# Patient Record
Sex: Female | Born: 1960 | Race: White | Hispanic: No | Marital: Married | Smoking: Never smoker
Health system: Southern US, Community
[De-identification: ages and names within clinical notes are randomized; demographics above are authoritative.]

## PROBLEM LIST (undated history)

## (undated) DIAGNOSIS — M810 Age-related osteoporosis without current pathological fracture: Secondary | ICD-10-CM

## (undated) DIAGNOSIS — K2100 Gastro-esophageal reflux disease with esophagitis, without bleeding: Secondary | ICD-10-CM

## (undated) DIAGNOSIS — F32A Depression, unspecified: Secondary | ICD-10-CM

## (undated) DIAGNOSIS — G729 Myopathy, unspecified: Secondary | ICD-10-CM

## (undated) DIAGNOSIS — K219 Gastro-esophageal reflux disease without esophagitis: Secondary | ICD-10-CM

## (undated) DIAGNOSIS — M3500 Sicca syndrome, unspecified: Secondary | ICD-10-CM

## (undated) DIAGNOSIS — I10 Essential (primary) hypertension: Secondary | ICD-10-CM

## (undated) DIAGNOSIS — G629 Polyneuropathy, unspecified: Secondary | ICD-10-CM

## (undated) DIAGNOSIS — F329 Major depressive disorder, single episode, unspecified: Secondary | ICD-10-CM

## (undated) DIAGNOSIS — J309 Allergic rhinitis, unspecified: Secondary | ICD-10-CM

## (undated) DIAGNOSIS — G894 Chronic pain syndrome: Secondary | ICD-10-CM

## (undated) DIAGNOSIS — N189 Chronic kidney disease, unspecified: Secondary | ICD-10-CM

## (undated) DIAGNOSIS — K635 Polyp of colon: Secondary | ICD-10-CM

## (undated) DIAGNOSIS — J45909 Unspecified asthma, uncomplicated: Secondary | ICD-10-CM

## (undated) DIAGNOSIS — J40 Bronchitis, not specified as acute or chronic: Secondary | ICD-10-CM

## (undated) DIAGNOSIS — F419 Anxiety disorder, unspecified: Secondary | ICD-10-CM

## (undated) HISTORY — DX: Chronic kidney disease, unspecified: N18.9

## (undated) HISTORY — DX: Myopathy, unspecified: G72.9

## (undated) HISTORY — DX: Sjogren syndrome, unspecified: M35.00

## (undated) HISTORY — DX: Bronchitis, not specified as acute or chronic: J40

## (undated) HISTORY — DX: Allergic rhinitis, unspecified: J30.9

## (undated) HISTORY — DX: Chronic pain syndrome: G89.4

## (undated) HISTORY — PX: UPPER GASTROINTESTINAL ENDOSCOPY: SHX188

## (undated) HISTORY — DX: Essential (primary) hypertension: I10

## (undated) HISTORY — DX: Polyneuropathy, unspecified: G62.9

## (undated) HISTORY — PX: COLONOSCOPY: SHX174

## (undated) HISTORY — DX: Hypercalcemia: E83.52

## (undated) HISTORY — DX: Major depressive disorder, single episode, unspecified: F32.9

## (undated) HISTORY — DX: Gastro-esophageal reflux disease with esophagitis, without bleeding: K21.00

## (undated) HISTORY — PX: ABDOMINAL HYSTERECTOMY: SHX81

## (undated) HISTORY — DX: Polyp of colon: K63.5

## (undated) HISTORY — DX: Depression, unspecified: F32.A

## (undated) HISTORY — DX: Anxiety disorder, unspecified: F41.9

## (undated) HISTORY — DX: Unspecified asthma, uncomplicated: J45.909

## (undated) HISTORY — DX: Gastro-esophageal reflux disease without esophagitis: K21.9

## (undated) HISTORY — DX: Age-related osteoporosis without current pathological fracture: M81.0

---

## 2014-07-01 DIAGNOSIS — N23 Unspecified renal colic: Secondary | ICD-10-CM | POA: Insufficient documentation

## 2014-11-10 DIAGNOSIS — W19XXXA Unspecified fall, initial encounter: Secondary | ICD-10-CM | POA: Insufficient documentation

## 2014-11-28 ENCOUNTER — Ambulatory Visit (INDEPENDENT_AMBULATORY_CARE_PROVIDER_SITE_OTHER): Payer: BLUE CROSS/BLUE SHIELD

## 2014-11-28 DIAGNOSIS — R52 Pain, unspecified: Secondary | ICD-10-CM

## 2014-11-28 DIAGNOSIS — M779 Enthesopathy, unspecified: Secondary | ICD-10-CM

## 2014-11-28 DIAGNOSIS — M722 Plantar fascial fibromatosis: Secondary | ICD-10-CM

## 2014-11-28 DIAGNOSIS — M7730 Calcaneal spur, unspecified foot: Secondary | ICD-10-CM | POA: Diagnosis not present

## 2014-11-28 DIAGNOSIS — M71571 Other bursitis, not elsewhere classified, right ankle and foot: Secondary | ICD-10-CM | POA: Diagnosis not present

## 2014-11-28 DIAGNOSIS — M7751 Other enthesopathy of right foot: Secondary | ICD-10-CM

## 2014-11-28 DIAGNOSIS — M778 Other enthesopathies, not elsewhere classified: Secondary | ICD-10-CM

## 2014-11-28 MED ORDER — MELOXICAM 15 MG PO TABS: 15.0000 mg | ORAL_TABLET | Freq: Every day | ORAL | Status: DC

## 2014-11-28 NOTE — Patient Instructions (Signed)

## 2014-11-28 NOTE — Progress Notes (Signed)
   Subjective:    Patient ID: Crystal Marquez, female    DOB: 01-Jun-1961, 54 y.o.   MRN: [id number]  HPI HURTS ON MY RIGHT SIDE OF MY FOOT AND HAS BEEN GOING ON FOR ABOUT  6 MONTHS AND IS SORE AND TENDER AND BURNS ON MY LEFT HEEL AND I WENT TO THE DOCTOR LAST MONTH AND THEY SENT ME TO [place] AND HAD X-RAYS DONE AND MY FEET SWELL AND COLDNESS    Review of Systems  Constitutional: Positive for fatigue.       NIGHT SWEATS  HENT: Positive for sinus pressure and sore throat.   Respiratory: Positive for cough.   Gastrointestinal:       BLADDER PROBLEMS   Musculoskeletal:       MUSCLE PAIN  Skin: Positive for rash.  Neurological: Positive for headaches.  All other systems reviewed and are negative.      Objective:   Physical Exam 54 year old white female well-developed well-nourished oriented 3 presents at this time with pain both feet 2 different symptoms are noted having pain in the inferior left heel and arch area medial calcaneal tubercle mid arch to heal over the right foot is pain points to the lateral column fifth MTP joints and fifth met cuneiform articulation site lateral column as well as on inversion eversion of the right foot motion left foot is more painful on dorsiflexion palpation of the plantar fascial. Objective findings as follows Vascular status is intact pedal pulses palpable DP +2 over 4 PT plus one over 4 bilateral Refill timed 3-4 seconds all digits epicritic and proprioceptive sensations intact and symmetric bilateral. No open wounds no ulcers no secondary infections left foot is pain on palpation of the medial band of plantar fascia from mid arch to inferior calcaneal tubercle area pain on first up in the morning or getting a fall. Of rest the right foot has pain on lateral column on inversion eversion palpation and range of motion. X-rays reveal no signs of fracture I7 O'Malley well-developed inferior calcaneal spurring and retrocalcaneal spurring bilateral there  is AP oblique views on the right foot reveal no fractures slight subluxation Lisfranc joint noted with asymmetric joint space narrowing of the first MTP joint as well as Lisfranc joint. No cysts tumors mild sensory bone along the fifth metatarsal base. Remainder the exam unremarkable patient does have rectus foot type sesamoid position 2 on x-rays hammertoe with flexible digital contractures lesser digits.        Assessment & Plan:  Assessment #100 fasciitis/heel spur syndrome left foo plan at this time patient placed in fascial strapping left foot prescription for meloxicam is issued. Assessment number to is capsulitis Lisfranc's joint as well as first and fifth MTP joint with bursitis secondary to altered gait and functionality plan patient is also placed in fascial strapping to provide some stability in gait and prevent the strain in the fascial ligaments and bursal area of fifth met base. Will reassess in 2 weeks for follow-up benefit of the strapping an NSAID therapies it beneficial both feet might benefit from orthoses and biomechanical support in the future as needed.  Prescription for meloxicam issued to her pharmacy  Harriet Masson [facility]

## 2014-12-15 ENCOUNTER — Ambulatory Visit (INDEPENDENT_AMBULATORY_CARE_PROVIDER_SITE_OTHER): Payer: BLUE CROSS/BLUE SHIELD

## 2014-12-15 VITALS — BP 111/72 | HR 74 | Resp 18

## 2014-12-15 DIAGNOSIS — M7730 Calcaneal spur, unspecified foot: Secondary | ICD-10-CM

## 2014-12-15 DIAGNOSIS — M7751 Other enthesopathy of right foot: Secondary | ICD-10-CM

## 2014-12-15 DIAGNOSIS — M71571 Other bursitis, not elsewhere classified, right ankle and foot: Secondary | ICD-10-CM

## 2014-12-15 DIAGNOSIS — M722 Plantar fascial fibromatosis: Secondary | ICD-10-CM

## 2014-12-15 DIAGNOSIS — M779 Enthesopathy, unspecified: Secondary | ICD-10-CM

## 2014-12-15 DIAGNOSIS — R52 Pain, unspecified: Secondary | ICD-10-CM

## 2014-12-15 DIAGNOSIS — M778 Other enthesopathies, not elsewhere classified: Secondary | ICD-10-CM

## 2014-12-15 NOTE — Progress Notes (Signed)
   Subjective:    Patient ID: Crystal Marquez, female    DOB: July 19, 1961, 54 y.o.   MRN: [id number]  HPI my right is still hurting and my left is doing good and i had company over this week due to a family member passing away    Review of Systems no new findings or systemic changes noted     Objective:   Physical Exam Neurovascular status is intact and unchanged pedal pulses are palpable FASCIAL strapping was in place both feet felt improvement including the lateral column on the right foot and plantar fascial heel area left foot. Pedal pulses are palpable epicritic and proprioceptive sensations intact patient does have coverage for orthoses at this time based on the fact that fascial strapping was beneficial would benefit from custom functional orthoses for both feet at this time.       Assessment & Plan:  Assessment this time capsulitis mid tarsus and Lisfranc joint right foot plantar fasciitis and heel spur syndrome left foot both responded to biomechanical support and stability of the fascial strapping should benefit from functional orthoses orthotic scan carried out at this time we'll order new functional orthoses fitted to the foot orthotic scan carried out will be followed with the next 3-4 weeks for fitting and orthotic pickup when ready  Harriet Masson DPM

## 2014-12-15 NOTE — Patient Instructions (Signed)

## 2014-12-31 DIAGNOSIS — E876 Hypokalemia: Secondary | ICD-10-CM | POA: Insufficient documentation

## 2015-01-05 ENCOUNTER — Ambulatory Visit: Payer: BLUE CROSS/BLUE SHIELD

## 2015-01-08 ENCOUNTER — Ambulatory Visit (INDEPENDENT_AMBULATORY_CARE_PROVIDER_SITE_OTHER): Payer: BLUE CROSS/BLUE SHIELD | Admitting: Podiatrist

## 2015-01-08 DIAGNOSIS — M722 Plantar fascial fibromatosis: Secondary | ICD-10-CM

## 2015-01-08 NOTE — Patient Instructions (Signed)

## 2015-01-08 NOTE — Progress Notes (Signed)
Patient presented today to pick up her orthotics. She is seen by the assistant. An states that the orthotics are comfortable. She'll be seen back in 4-6 weeks for recheck and will call if any concerns arise. She is giving break-in instructions for wearing of the orthotics.

## 2015-01-22 DIAGNOSIS — M79671 Pain in right foot: Secondary | ICD-10-CM | POA: Insufficient documentation

## 2015-02-25 ENCOUNTER — Encounter: Payer: Self-pay | Admitting: Podiatry

## 2015-02-25 ENCOUNTER — Ambulatory Visit: Payer: BLUE CROSS/BLUE SHIELD | Admitting: Podiatrist

## 2015-02-25 ENCOUNTER — Ambulatory Visit (INDEPENDENT_AMBULATORY_CARE_PROVIDER_SITE_OTHER): Payer: BLUE CROSS/BLUE SHIELD | Admitting: Podiatry

## 2015-02-25 VITALS — BP 113/67 | HR 83 | Resp 18

## 2015-02-25 DIAGNOSIS — M779 Enthesopathy, unspecified: Secondary | ICD-10-CM

## 2015-02-25 DIAGNOSIS — M722 Plantar fascial fibromatosis: Secondary | ICD-10-CM | POA: Diagnosis not present

## 2015-02-25 DIAGNOSIS — B353 Tinea pedis: Secondary | ICD-10-CM

## 2015-02-25 MED ORDER — OXAPROZIN 600 MG PO TABS: ORAL_TABLET | ORAL | Status: DC

## 2015-02-26 NOTE — Progress Notes (Addendum)
Subjective:     Patient ID: Crystal Marquez, female   DOB: 24-Aug-1961, 54 y.o.   MRN: [id number]  HPI  This patient presents to the office with pain in her left heel.  Her pain has been present for weeks and is worsening over time.  She says she had previous history which was successfully treated with injection therapy.  She relates pain in the AM and upon rising from a sitting position.  She says her pain is 7-8 and causes her to limmp.  She says she has previously made orthoses which I reminded her to wear.   Review of Systems     Objective:   Physical Exam Objective: Review of past medical history, medications, social history and allergies were performed.  Vascular: Dorsalis pedis and posterior tibial pulses were palpable B/L, capillary refill was  WNL B/L, temperature gradient was WNL B/L   Skin: Dry scaly skin right forefoot.  Nails: appear healthy with no signs of mycosis or infections  Sensory: Semmes Weinstein monifilament WNL   Orthopedic: Orthopedic evaluation demonstrates all joints distal t ankle have full ROM without crepitus, muscle power WNL B/L.  Palpable pain at the inserion of achilles tendon left foot.  No redness or swelling noted.left heel     Assessment:  les tendinitis left heel     Plan:     ROV  Discussed ice and stretching   Told her to continue using her orthoses.  Changed her medicine to Daypro.

## 2015-03-18 ENCOUNTER — Ambulatory Visit (INDEPENDENT_AMBULATORY_CARE_PROVIDER_SITE_OTHER): Payer: BLUE CROSS/BLUE SHIELD | Admitting: Podiatry

## 2015-03-18 ENCOUNTER — Encounter: Payer: Self-pay | Admitting: Podiatry

## 2015-03-18 VITALS — BP 114/66 | HR 79 | Resp 18

## 2015-03-18 DIAGNOSIS — M779 Enthesopathy, unspecified: Secondary | ICD-10-CM

## 2015-03-18 NOTE — Progress Notes (Signed)
Subjective:     Patient ID: Crystal Marquez, female   DOB: August 28, 1961, 54 y.o.   MRN: [id number]  HPIPatient returns saying she is 80% better in her heel.  She has been taking Daypro and wearing her orthoses.  She is pleased with her improvement.   Review of Systems     Objective:   Physical Exam Objective: Review of past medical history, medications, social history and allergies were performed.  Vascular: Dorsalis pedis and posterior tibial pulses were palpable B/L, capillary refill was  WNL B/L, temperature gradient was WNL B/L   Skin:  No signs of symptoms of infection or ulcers on both feet  Nails: appear healthy with no signs of mycosis or infections  Sensory: Semmes Weinstein monifilament WNL   Orthopedic: Orthopedic evaluation demonstrates all joints distal t ankle have full ROM without crepitus, muscle power WNL B/L.  The pain at the insertion achilles tendon has diminished.     Assessment:     Tendinitis, achilles     Plan:     ROV  Continue with medicine and orthoses.

## 2015-04-14 DIAGNOSIS — R82998 Other abnormal findings in urine: Secondary | ICD-10-CM | POA: Insufficient documentation

## 2015-05-15 DIAGNOSIS — R601 Generalized edema: Secondary | ICD-10-CM | POA: Insufficient documentation

## 2015-06-24 ENCOUNTER — Ambulatory Visit (INDEPENDENT_AMBULATORY_CARE_PROVIDER_SITE_OTHER): Payer: BLUE CROSS/BLUE SHIELD | Admitting: Sports Medicine

## 2015-06-24 ENCOUNTER — Encounter: Payer: Self-pay | Admitting: Sports Medicine

## 2015-06-24 VITALS — BP 147/89 | HR 89 | Resp 14

## 2015-06-24 DIAGNOSIS — M779 Enthesopathy, unspecified: Secondary | ICD-10-CM

## 2015-06-24 DIAGNOSIS — M722 Plantar fascial fibromatosis: Secondary | ICD-10-CM | POA: Diagnosis not present

## 2015-06-24 DIAGNOSIS — R600 Localized edema: Secondary | ICD-10-CM

## 2015-06-24 DIAGNOSIS — S93601A Unspecified sprain of right foot, initial encounter: Secondary | ICD-10-CM

## 2015-06-24 DIAGNOSIS — M79606 Pain in leg, unspecified: Secondary | ICD-10-CM | POA: Diagnosis not present

## 2015-06-24 MED ORDER — TRIAMCINOLONE ACETONIDE 10 MG/ML IJ SUSP: 10.0000 mg | Freq: Once | INTRAMUSCULAR | Status: DC

## 2015-06-24 NOTE — Patient Instructions (Signed)

## 2015-06-24 NOTE — Progress Notes (Signed)
Patient ID: Sheryll Dymek, female   DOB: 1961-02-12, 54 y.o.   MRN: [id number]  Subjective: Blayre Papania is a 54 y.o. female patient presents to office with complaint of heel pain on the left that has changed in location; use to be back of heel now it is painful on the bottom of the heel. Patient admits to post static dyskinesia for 1 month in duration. Patient has treated this problem with stretching, inserts of which feels isn't helping much anymore and Daypro. Patient reports that this treatment is not working for pain on bottom of heel; reports pain at back of heel is now resolved.  Patient admits to pain over the top of the right foot that started worsening over the last few weeks especially with flexion of the foot; describes it as a aching pain worse at the end of the day or excess activity. Patient denies any trauma, trip, fall, sprain, or frank injury to the right foot however admits to increased swelling and pain not relieved with insoles and Daypro. Patient denies any other pedal complaints at this time.  There are no active problems to display for this patient.  Current Outpatient Prescriptions on File Prior to Visit  Medication Sig Dispense Refill  . ALPRAZolam (XANAX) 0.5 MG tablet   0  . azithromycin (ZITHROMAX) 250 MG tablet   1  . clotrimazole-betamethasone (LOTRISONE) cream Apply 1 application topically 2 (two) times daily.    . DULoxetine (CYMBALTA) 30 MG capsule   0  . DULoxetine (CYMBALTA) 60 MG capsule   1  . escitalopram (LEXAPRO) 20 MG tablet   1  . GAVILYTE-N WITH FLAVOR PACK 420 G solution   0  . gemfibrozil (LOPID) 600 MG tablet   0  . HYDROcodone-acetaminophen (NORCO/VICODIN) 5-325 MG per tablet   0  . hydroxychloroquine (PLAQUENIL) 200 MG tablet   0  . lisinopril-hydrochlorothiazide (PRINZIDE,ZESTORETIC) 20-25 MG per tablet   0  . loratadine (CLARITIN) 10 MG tablet Take 10 mg by mouth daily.    Marland Kitchen LYRICA 100 MG capsule   0  . meloxicam (MOBIC) 15 MG tablet Take 1 tablet  (15 mg total) by mouth daily. 30 tablet 1  . ofloxacin (FLOXIN) 0.3 % otic solution   0  . omeprazole (PRILOSEC) 40 MG capsule   0  . oxaprozin (DAYPRO) 600 MG tablet ONE TABLET TWICE A DAY 30 tablet 2  . potassium chloride SA (K-DUR,KLOR-CON) 20 MEQ tablet Take 20 mEq by mouth 2 (two) times daily.    Marland Kitchen PROAIR HFA 108 (90 BASE) MCG/ACT inhaler   0  . triamcinolone (KENALOG) 0.025 % cream Apply 1 application topically 2 (two) times daily.     No current facility-administered medications on file prior to visit.   No Known Allergies  Objective: Physical Exam General: The patient is alert and oriented x3 in no acute distress.  Dermatology: Skin is warm, dry and supple bilateral lower extremities. Nails 1-10 are normal. There is no erythema, no eccymosis, no open lesions present. Integument is otherwise unremarkable.  Vascular: Dorsalis Pedis pulse and Posterior Tibial pulse are 2/4 bilateral. Capillary fill time is immediate to all digits. Mild localized edema to dorsum of right foot.   Neurological: Grossly intact to light touch bilateral. Negative tuning fork or stress fracture pain to dorsum to right foot.   Musculoskeletal: Tenderness to palpation at the medial calcaneal tubercale and through the insertion of the plantar fascia on the left foot. Tenderness with dorsiflexion and inversion right foot. Pain  with palpation to dorsum of right foot worsen with flexion of toes. Muscular strength within normal limits bilateral with mild midtarsal joint pain on right. All other joints are pain free.   Custom foot orthotics: appear to be well maintained, fit well, with no gapping.  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   Assessment and Plan: Problem List Items Addressed This Visit    None    Visit Diagnoses    Tendonitis    -   Primary    rt foot dorsal aspect    Relevant Medications    triamcinolone acetonide (KENALOG) 10 MG/ML injection 10 mg    Localized edema        rt foot    Sprain of foot, right, initial encounter        Plantar fasciitis of left foot        Relevant Medications    triamcinolone acetonide (KENALOG) 10 MG/ML injection 10 mg    Pain of lower extremity, unspecified laterality          -Complete examination performed. Discussed with patient in detail the condition of plantar fasciitis, tendonitis and possible sprain/overload due to compensation how this occurs and general treatment options. Explained both conservative and surgical treatments.  -After oral consent and aseptic prep, injected a mixture containing 1 ml of 1%  plain lidocaine, 1 ml 0.25% plain marcaine, 0.5 ml of kenalog 10 and 0.5 ml of dexamethasone phosphate into left heel and dorsum of right foot. -Continue current oral anti-inflammatories  -Recommended good supportive shoes and advised use of custom insert on left.  -Production assistant, radio dressing to right secured with coban; patient to keep clean and dry and intact for 1 week and then slowly transition afterwards from post op shoe to normal supportive shoe with insert. -Explained and dispensed to patient daily stretching exercises on left. -Recommend patient to ice affected areas 1-2x daily. -Patient to return to office in 2 weeks for follow up or sooner if problems or questions  Arise.  Landis Martins, DPM

## 2015-07-08 ENCOUNTER — Ambulatory Visit (INDEPENDENT_AMBULATORY_CARE_PROVIDER_SITE_OTHER): Payer: BLUE CROSS/BLUE SHIELD | Admitting: Sports Medicine

## 2015-07-08 ENCOUNTER — Ambulatory Visit: Payer: BLUE CROSS/BLUE SHIELD | Admitting: Sports Medicine

## 2015-07-08 ENCOUNTER — Encounter: Payer: Self-pay | Admitting: Sports Medicine

## 2015-07-08 VITALS — BP 129/94 | HR 83 | Resp 14

## 2015-07-08 DIAGNOSIS — M7751 Other enthesopathy of right foot: Secondary | ICD-10-CM

## 2015-07-08 DIAGNOSIS — M722 Plantar fascial fibromatosis: Secondary | ICD-10-CM | POA: Diagnosis not present

## 2015-07-08 DIAGNOSIS — M779 Enthesopathy, unspecified: Secondary | ICD-10-CM

## 2015-07-08 DIAGNOSIS — R600 Localized edema: Secondary | ICD-10-CM

## 2015-07-08 DIAGNOSIS — R52 Pain, unspecified: Secondary | ICD-10-CM | POA: Diagnosis not present

## 2015-07-08 DIAGNOSIS — S93601D Unspecified sprain of right foot, subsequent encounter: Secondary | ICD-10-CM | POA: Diagnosis not present

## 2015-07-08 DIAGNOSIS — M778 Other enthesopathies, not elsewhere classified: Secondary | ICD-10-CM

## 2015-07-08 MED ORDER — DICLOFENAC SODIUM 75 MG PO TBEC: 75.0000 mg | DELAYED_RELEASE_TABLET | Freq: Two times a day (BID) | ORAL | Status: DC

## 2015-07-08 MED ORDER — METHYLPREDNISOLONE 4 MG PO TBPK: ORAL_TABLET | ORAL | Status: DC

## 2015-07-08 NOTE — Progress Notes (Signed)
Patient ID: Crystal Marquez, female   DOB: [birth year], 54 y.o.   MRN: [id number] Subjective: Crystal Marquez is a 54 y.o. female returns to office for follow up evaluation after Left heel injection for plantar fasciitis, injection #1 administered 2 weeks ago. Patient states that the injection seems to help her pain for 1 day but now pain is back again. Patient also states that the unna boot and the injection seemed to help the right foot until yesterday started having a little bit of pain over the top of the foot; admits that pain is much better than before and is less intense/frequent. Patient denies any recent changes in medications or new problems since last visit.   Objective:  Physical Exam General: The patient is alert and oriented x3 in no acute distress.  Dermatology: Skin is warm, dry and supple bilateral lower extremities. Nails 1-10 are normal. There is no erythema, no eccymosis, no open lesions present. Integument is otherwise unremarkable.  Vascular: Dorsalis Pedis pulse and Posterior Tibial pulse are 2/4 bilateral. Capillary fill time is immediate to all digits. Improved localized edema to dorsum of right foot.   Neurological: Grossly intact to light touch bilateral. Negative tuning fork or stress fracture pain to dorsum to right foot.   Musculoskeletal: Tenderness to palpation at the medial calcaneal tubercale and through the insertion of the plantar fascia on the left foot. No tenderness with dorsiflexion and inversion right foot. Mild Pain with palpation to dorsum of right foot much improved from prior. Muscular strength within normal limits bilateral with mild midtarsal joint pain on right much improved from prior. All other joints are pain free with no limitation.   Assessment and Plan: Problem List Items Addressed This Visit    None    Visit Diagnoses    Pain    -  Primary    Relevant Medications    methylPREDNISolone (MEDROL DOSEPAK) 4 MG TBPK tablet    diclofenac (VOLTAREN) 75 MG  EC tablet    Plantar fasciitis of left foot        Not improved    Capsulitis of foot, right        Improving    Relevant Medications    methylPREDNISolone (MEDROL DOSEPAK) 4 MG TBPK tablet    diclofenac (VOLTAREN) 75 MG EC tablet    Localized edema        resloved    Foot sprain, right, subsequent encounter        Improving       -Complete examination performed.  -Discussed treatment options; risk, benefits, alternatives explained. -Rx PT for both feet with modalities -Rx Medrol dose pack and Diclofenac 75mg  bid to start after dose pack is complete -Cont with stretching, icing, supportive shoes, custom inserts -Will consider fascial brace at next encounter for left  -Patient to return to office in 3 weeks for follow up or sooner if problems or questions arise.  Landis Martins, DPM

## 2015-07-10 ENCOUNTER — Telehealth: Payer: Self-pay

## 2015-07-10 NOTE — Telephone Encounter (Signed)
Appt made with [facility] [place] for 07/13/15, referral from [facility]

## 2015-07-30 ENCOUNTER — Encounter: Payer: Self-pay | Admitting: Sports Medicine

## 2015-07-30 ENCOUNTER — Ambulatory Visit (INDEPENDENT_AMBULATORY_CARE_PROVIDER_SITE_OTHER): Payer: BLUE CROSS/BLUE SHIELD | Admitting: Sports Medicine

## 2015-07-30 DIAGNOSIS — M7751 Other enthesopathy of right foot: Secondary | ICD-10-CM | POA: Diagnosis not present

## 2015-07-30 DIAGNOSIS — M722 Plantar fascial fibromatosis: Secondary | ICD-10-CM | POA: Diagnosis not present

## 2015-07-30 DIAGNOSIS — M79672 Pain in left foot: Secondary | ICD-10-CM | POA: Diagnosis not present

## 2015-07-30 DIAGNOSIS — R600 Localized edema: Secondary | ICD-10-CM | POA: Diagnosis not present

## 2015-07-30 DIAGNOSIS — M778 Other enthesopathies, not elsewhere classified: Secondary | ICD-10-CM

## 2015-07-30 DIAGNOSIS — S93601D Unspecified sprain of right foot, subsequent encounter: Secondary | ICD-10-CM | POA: Diagnosis not present

## 2015-07-30 DIAGNOSIS — M79671 Pain in right foot: Secondary | ICD-10-CM | POA: Diagnosis not present

## 2015-07-30 DIAGNOSIS — M779 Enthesopathy, unspecified: Secondary | ICD-10-CM

## 2015-07-30 NOTE — Progress Notes (Signed)
Patient ID: Crystal Marquez, female   DOB: 08-Sep-1961, 54 y.o.   MRN: [id number] Subjective: Crystal Marquez is a 54 y.o. female returns to office for follow up evaluation after Left heel injection for plantar fasciitis, injection #1 administered approx 5 weeks ago and right dorsal foot pain. Patient completed medrol dose pack and oral voltaren with no problems. Patient has been using custom inserts, icing, stretching and wearing supportive shoes with resolution of symptoms. Patient is also in PT with no problems. Patient denies any recent changes in medications or new problems since last visit.   Objective:  Physical Exam General: The patient is alert and oriented x3 in no acute distress.  Dermatology: Skin is warm, dry and supple bilateral lower extremities. Nails 1-10 are normal. There is no erythema, no eccymosis, no open lesions present. Integument is otherwise unremarkable.  Vascular: Dorsalis Pedis pulse and Posterior Tibial pulse are 2/4 bilateral. Capillary fill time is immediate to all digits. Improved localized edema to dorsum of right foot.   Neurological: Grossly intact to light touch bilateral. Negative tuning fork or stress fracture pain to dorsum to right foot.   Musculoskeletal: No Tenderness to palpation at the medial calcaneal tubercale and through the insertion of plantar fascia, No pain with palpation to dorsum of right foot. No pain with tuning fork. Muscular strength within normal limits bilateral with no mild midtarsal joint pain on right improved from prior. All other joints are pain free with no limitation.   Assessment and Plan: Problem List Items Addressed This Visit    None    Visit Diagnoses    Foot pain, bilateral    -  Primary    Improved    Plantar fasciitis of left foot        Improved    Capsulitis of foot, right        Improved    Foot sprain, right, subsequent encounter        Improved    Localized edema        Resolved      -Complete examination performed.   -Discussed treatment options; risk, benefits, alternatives explained. -No need for repeat steroid injections at this time. Patient is progressing nicely with no symptoms currently. -Cont with  PT for both feet with modalities until complete -Cont with stretching, icing, supportive shoes, custom inserts. -Patient to return to office in 4 weeks or as needed for follow up or sooner if problems or questions arise.  Landis Martins, DPM

## 2015-09-02 ENCOUNTER — Encounter: Payer: Self-pay | Admitting: Sports Medicine

## 2015-09-02 ENCOUNTER — Ambulatory Visit (INDEPENDENT_AMBULATORY_CARE_PROVIDER_SITE_OTHER): Payer: BLUE CROSS/BLUE SHIELD | Admitting: Sports Medicine

## 2015-09-02 DIAGNOSIS — M7751 Other enthesopathy of right foot: Secondary | ICD-10-CM | POA: Diagnosis not present

## 2015-09-02 DIAGNOSIS — M79672 Pain in left foot: Secondary | ICD-10-CM | POA: Diagnosis not present

## 2015-09-02 DIAGNOSIS — M779 Enthesopathy, unspecified: Secondary | ICD-10-CM

## 2015-09-02 DIAGNOSIS — M79671 Pain in right foot: Secondary | ICD-10-CM

## 2015-09-02 DIAGNOSIS — S93601D Unspecified sprain of right foot, subsequent encounter: Secondary | ICD-10-CM | POA: Diagnosis not present

## 2015-09-02 DIAGNOSIS — M778 Other enthesopathies, not elsewhere classified: Secondary | ICD-10-CM

## 2015-09-02 DIAGNOSIS — M722 Plantar fascial fibromatosis: Secondary | ICD-10-CM | POA: Diagnosis not present

## 2015-09-02 MED ORDER — TRIAMCINOLONE ACETONIDE 10 MG/ML IJ SUSP: 10.0000 mg | Freq: Once | INTRAMUSCULAR | Status: DC

## 2015-09-02 MED ORDER — DICLOFENAC SODIUM 75 MG PO TBEC: 75.0000 mg | DELAYED_RELEASE_TABLET | Freq: Two times a day (BID) | ORAL | Status: DC

## 2015-09-02 NOTE — Progress Notes (Signed)
Patient ID: Crystal Marquez, female   DOB: [birth year], 54 y.o.   MRN: [id number]  Subjective: Crystal Marquez is a 54 y.o. female returns to office for follow up evaluation after Left heel injection for plantar fasciitis, injection #1 administered approx 9 weeks ago and right  foot pain. Patient states that she has no pain in right foot. Admits that her left heel was doing well after last visit but now has started to become painful again. Patient has been using custom inserts, icing, stretching and wearing supportive shoes.. Patient also completed PT with no problems. Patient denies any recent changes in medications or new problems since last visit.   There are no active problems to display for this patient.  Current Outpatient Prescriptions on File Prior to Visit  Medication Sig Dispense Refill  . ALPRAZolam (XANAX) 0.5 MG tablet   0  . azithromycin (ZITHROMAX) 250 MG tablet   1  . clotrimazole-betamethasone (LOTRISONE) cream Apply 1 application topically 2 (two) times daily.    . DULoxetine (CYMBALTA) 30 MG capsule   0  . DULoxetine (CYMBALTA) 60 MG capsule   1  . escitalopram (LEXAPRO) 20 MG tablet   1  . GAVILYTE-N WITH FLAVOR PACK 420 G solution   0  . gemfibrozil (LOPID) 600 MG tablet   0  . HYDROcodone-acetaminophen (NORCO/VICODIN) 5-325 MG per tablet   0  . hydroxychloroquine (PLAQUENIL) 200 MG tablet   0  . lisinopril-hydrochlorothiazide (PRINZIDE,ZESTORETIC) 20-25 MG per tablet   0  . loratadine (CLARITIN) 10 MG tablet Take 10 mg by mouth daily.    Marland Kitchen LYRICA 100 MG capsule   0  . meloxicam (MOBIC) 15 MG tablet Take 1 tablet (15 mg total) by mouth daily. 30 tablet 1  . methylPREDNISolone (MEDROL DOSEPAK) 4 MG TBPK tablet Dosepack; take as instructed 21 tablet 0  . ofloxacin (FLOXIN) 0.3 % otic solution   0  . omeprazole (PRILOSEC) 40 MG capsule   0  . oxaprozin (DAYPRO) 600 MG tablet ONE TABLET TWICE A DAY 30 tablet 2  . potassium chloride SA (K-DUR,KLOR-CON) 20 MEQ tablet Take 20 mEq by  mouth 2 (two) times daily.    Marland Kitchen PROAIR HFA 108 (90 BASE) MCG/ACT inhaler   0  . triamcinolone (KENALOG) 0.025 % cream Apply 1 application topically 2 (two) times daily.     Current Facility-Administered Medications on File Prior to Visit  Medication Dose Route Frequency Provider Last Rate Last Dose  . triamcinolone acetonide (KENALOG) 10 MG/ML injection 10 mg  10 mg Other Once [employer], DPM       No Known Allergies   Objective:  Physical Exam General: The patient is alert and oriented x3 in no acute distress.  Dermatology: Skin is warm, dry and supple bilateral lower extremities. Nails 1-10 are normal. There is no erythema, no eccymosis, no open lesions present. Integument is otherwise unremarkable.  Vascular: Dorsalis Pedis pulse and Posterior Tibial pulse are 2/4 bilateral. Capillary fill time is immediate to all digits. Improved localized edema to dorsum of right foot.   Neurological: Grossly intact to light touch bilateral.   Musculoskeletal: There is Tenderness to palpation at the medial calcaneal tubercale and through the insertion of plantar fascia on left, No pain with palpation to dorsum of right foot. No pain with tuning fork. Muscular strength within normal limits bilateral. All  joints are pain free with no limitation.   Assessment and Plan: Problem List Items Addressed This Visit    None  Visit Diagnoses    Foot pain, bilateral    -  Primary    right foot resolved. left foot recurring    Relevant Medications    diclofenac (VOLTAREN) 75 MG EC tablet    triamcinolone acetonide (KENALOG) 10 MG/ML injection 10 mg    Plantar fasciitis of left foot        Relevant Medications    diclofenac (VOLTAREN) 75 MG EC tablet    triamcinolone acetonide (KENALOG) 10 MG/ML injection 10 mg    Capsulitis of foot, right        resolved    Relevant Medications    diclofenac (VOLTAREN) 75 MG EC tablet    triamcinolone acetonide (KENALOG) 10 MG/ML injection 10 mg    Foot  sprain, right, subsequent encounter        resolved      -Complete examination performed.  -Discussed treatment options; risk, benefits, alternatives explained. -For Right foot, symptoms remained resolved; recommend continue with close monitoring and good supportive shoes daily. -For Left foot, re-educated patient on proper stretching and icing. After verbal consent Injection #2 performed at left heel with a 3cc mixture of 2% lidocaine plain, 0.5% marcaine plain, Kenalog 10, and dexamethasone phosphate; patient tolerated injection well. Post injection care explained -Rx Voltaren PO; counseled patient on possible adverse reactions -Dispensed a fascial brace with instructions to use daily -Cont with stretching, icing, supportive shoes, custom inserts. -Patient to return to office in 3 weeks or as needed for follow up or sooner if problems or questions arise. Advised patient to consider more aggressive treatment options for left heel since this has been present for some time and only making episodic improvements. At next encounter may consider night splint, MRI, and other treatment modalities.   Landis Martins, DPM

## 2015-09-02 NOTE — Patient Instructions (Signed)
Plantar Fasciitis Plantar fasciitis is a painful foot condition that affects the heel. It occurs when the band of tissue that connects the toes to the heel bone (plantar fascia) becomes irritated. This can happen after exercising too much or doing other repetitive activities (overuse injury). The pain from plantar fasciitis can range from mild irritation to severe pain that makes it difficult for you to walk or move. The pain is usually worse in the morning or after you have been sitting or lying down for a while. CAUSES This condition may be caused by:  Standing for long periods of time.  Wearing shoes that do not fit.  Doing high-impact activities, including running, aerobics, and ballet.  Being overweight.  Having an abnormal way of walking (gait).  Having tight calf muscles.  Having high arches in your feet.  Starting a new athletic activity. SYMPTOMS The main symptom of this condition is heel pain. Other symptoms include:  Pain that gets worse after activity or exercise.  Pain that is worse in the morning or after resting.  Pain that goes away after you walk for a few minutes. DIAGNOSIS This condition may be diagnosed based on your signs and symptoms. Your health care provider will also do a physical exam to check for:  A tender area on the bottom of your foot.  A high arch in your foot.  Pain when you move your foot.  Difficulty moving your foot. You may also need to have imaging studies to confirm the diagnosis. These can include:  X-rays.  Ultrasound.  MRI. TREATMENT  Treatment for plantar fasciitis depends on the severity of the condition. Your treatment may include:  Rest, ice, and over-the-counter pain medicines to manage your pain.  Exercises to stretch your calves and your plantar fascia.  A splint that holds your foot in a stretched, upward position while you sleep (night splint).  Physical therapy to relieve symptoms and prevent problems in the  future.  Cortisone injections to relieve severe pain.  Extracorporeal shock wave therapy (ESWT) to stimulate damaged plantar fascia with electrical impulses. It is often used as a last resort before surgery.  Surgery, if other treatments have not worked after 12 months. HOME CARE INSTRUCTIONS  Take medicines only as directed by your health care provider.  Avoid activities that cause pain.  Roll the bottom of your foot over a bag of ice or a bottle of cold water. Do this for 20 minutes, 3-4 times a day.  Perform simple stretches as directed by your health care provider.  Try wearing athletic shoes with air-sole or gel-sole cushions or soft shoe inserts.  Wear a night splint while sleeping, if directed by your health care provider.  Keep all follow-up appointments with your health care provider. PREVENTION   Do not perform exercises or activities that cause heel pain.  Consider finding low-impact activities if you continue to have problems.  Lose weight if you need to. The best way to prevent plantar fasciitis is to avoid the activities that aggravate your plantar fascia. SEEK MEDICAL CARE IF:  Your symptoms do not go away after treatment with home care measures.  Your pain gets worse.  Your pain affects your ability to move or do your daily activities.   This information is not intended to replace advice given to you by your health care provider. Make sure you discuss any questions you have with your health care provider.   Document Released: 05/31/2001 Document Revised: 05/27/2015 Document Reviewed: 07/16/2014 Elsevier   Interactive Patient Education 2016 [employer].  

## 2015-09-03 ENCOUNTER — Ambulatory Visit: Payer: BLUE CROSS/BLUE SHIELD | Admitting: Sports Medicine

## 2015-09-23 ENCOUNTER — Ambulatory Visit: Payer: BLUE CROSS/BLUE SHIELD | Admitting: Sports Medicine

## 2015-09-30 ENCOUNTER — Encounter: Payer: Self-pay | Admitting: Sports Medicine

## 2015-09-30 ENCOUNTER — Ambulatory Visit (INDEPENDENT_AMBULATORY_CARE_PROVIDER_SITE_OTHER): Payer: BLUE CROSS/BLUE SHIELD | Admitting: Sports Medicine

## 2015-09-30 DIAGNOSIS — M79672 Pain in left foot: Secondary | ICD-10-CM | POA: Diagnosis not present

## 2015-09-30 DIAGNOSIS — M722 Plantar fascial fibromatosis: Secondary | ICD-10-CM

## 2015-09-30 NOTE — Progress Notes (Signed)
Patient ID: Crystal Marquez, female   DOB: 07-Jul-1961, 55 y.o.   MRN: [id number]  Subjective: Crystal Marquez is a 55 y.o. female returns to office for follow up evaluation after Left heel injection for plantar fasciitis, injection #2 administered approx 4 weeks ago and right  foot pain. Patient states that she has no pain in left or right foot at this time. Patient has finished PT and oral diclofenac with no problems. Patient has been using fascial brace, custom inserts, icing, stretching and wearing supportive shoes. Patient denies any recent changes in medications or new problems since last visit.   There are no active problems to display for this patient.  Current Outpatient Prescriptions on File Prior to Visit  Medication Sig Dispense Refill  . ALPRAZolam (XANAX) 0.5 MG tablet   0  . azithromycin (ZITHROMAX) 250 MG tablet   1  . clotrimazole-betamethasone (LOTRISONE) cream Apply 1 application topically 2 (two) times daily.    . diclofenac (VOLTAREN) 75 MG EC tablet Take 1 tablet (75 mg total) by mouth 2 (two) times daily. 30 tablet 0  . DULoxetine (CYMBALTA) 30 MG capsule   0  . DULoxetine (CYMBALTA) 60 MG capsule   1  . escitalopram (LEXAPRO) 20 MG tablet   1  . GAVILYTE-N WITH FLAVOR PACK 420 G solution   0  . gemfibrozil (LOPID) 600 MG tablet   0  . HYDROcodone-acetaminophen (NORCO/VICODIN) 5-325 MG per tablet   0  . hydroxychloroquine (PLAQUENIL) 200 MG tablet   0  . lisinopril-hydrochlorothiazide (PRINZIDE,ZESTORETIC) 20-25 MG per tablet   0  . loratadine (CLARITIN) 10 MG tablet Take 10 mg by mouth daily.    Marland Kitchen LYRICA 100 MG capsule   0  . meloxicam (MOBIC) 15 MG tablet Take 1 tablet (15 mg total) by mouth daily. 30 tablet 1  . methylPREDNISolone (MEDROL DOSEPAK) 4 MG TBPK tablet Dosepack; take as instructed 21 tablet 0  . ofloxacin (FLOXIN) 0.3 % otic solution   0  . omeprazole (PRILOSEC) 40 MG capsule   0  . oxaprozin (DAYPRO) 600 MG tablet ONE TABLET TWICE A DAY 30 tablet 2  . potassium  chloride SA (K-DUR,KLOR-CON) 20 MEQ tablet Take 20 mEq by mouth 2 (two) times daily.    Marland Kitchen PROAIR HFA 108 (90 BASE) MCG/ACT inhaler   0  . triamcinolone (KENALOG) 0.025 % cream Apply 1 application topically 2 (two) times daily.     Current Facility-Administered Medications on File Prior to Visit  Medication Dose Route Frequency Provider Last Rate Last Dose  . triamcinolone acetonide (KENALOG) 10 MG/ML injection 10 mg  10 mg Other Once [employer], DPM      . triamcinolone acetonide (KENALOG) 10 MG/ML injection 10 mg  10 mg Other Once [employer], DPM       No Known Allergies   Objective:  Physical Exam General: The patient is alert and oriented x3 in no acute distress.  Dermatology: Skin is warm, dry and supple bilateral lower extremities. Nails 1-10 are normal. There is no erythema, no eccymosis, no open lesions present. Integument is otherwise unremarkable.  Vascular: Dorsalis Pedis pulse and Posterior Tibial pulse are 2/4 bilateral. Capillary fill time is immediate to all digits. Improved localized edema to dorsum of right foot.   Neurological: Grossly intact to light touch bilateral.   Musculoskeletal: There is No tenderness to palpation at the medial calcaneal tubercale and through the insertion of plantar fascia on left, No pain with palpation to dorsum of right foot.  No pain with tuning fork. Muscular strength within normal limits bilateral. All  joints are pain free with no limitation.   Assessment and Plan: Problem List Items Addressed This Visit    None    Visit Diagnoses    Left foot pain    -  Primary    Plantar fasciitis of left foot          -Complete examination performed.  -Discussed long term plan of care -For Right foot, symptoms remain resolved; recommend continue with close monitoring and good supportive shoes daily. -For Left foot, Symptoms are resolved after 2nd injection; no injection given today - Recommend to slowly wean from fascial brace on  left -Cont with stretching, icing, supportive shoes, custom inserts daily. -Patient to return to office as needed or sooner if conditions worsen.   Landis Martins, DPM

## 2016-03-29 DIAGNOSIS — R413 Other amnesia: Secondary | ICD-10-CM | POA: Insufficient documentation

## 2016-03-29 DIAGNOSIS — G4452 New daily persistent headache (NDPH): Secondary | ICD-10-CM | POA: Insufficient documentation

## 2016-07-06 DIAGNOSIS — R7989 Other specified abnormal findings of blood chemistry: Secondary | ICD-10-CM | POA: Insufficient documentation

## 2016-09-19 HISTORY — PX: CARPAL TUNNEL RELEASE: SHX101

## 2017-01-26 DIAGNOSIS — L723 Sebaceous cyst: Secondary | ICD-10-CM | POA: Insufficient documentation

## 2017-01-26 DIAGNOSIS — K649 Unspecified hemorrhoids: Secondary | ICD-10-CM | POA: Insufficient documentation

## 2017-04-03 DIAGNOSIS — R1030 Lower abdominal pain, unspecified: Secondary | ICD-10-CM | POA: Insufficient documentation

## 2017-08-24 DIAGNOSIS — L600 Ingrowing nail: Secondary | ICD-10-CM | POA: Insufficient documentation

## 2017-08-25 DIAGNOSIS — L03032 Cellulitis of left toe: Secondary | ICD-10-CM | POA: Insufficient documentation

## 2017-10-15 DIAGNOSIS — R0602 Shortness of breath: Secondary | ICD-10-CM | POA: Diagnosis not present

## 2017-10-15 DIAGNOSIS — R05 Cough: Secondary | ICD-10-CM | POA: Diagnosis not present

## 2017-10-15 DIAGNOSIS — E119 Type 2 diabetes mellitus without complications: Secondary | ICD-10-CM

## 2017-10-15 DIAGNOSIS — J45998 Other asthma: Secondary | ICD-10-CM

## 2017-10-15 DIAGNOSIS — I10 Essential (primary) hypertension: Secondary | ICD-10-CM

## 2017-10-16 DIAGNOSIS — R05 Cough: Secondary | ICD-10-CM | POA: Diagnosis not present

## 2017-10-16 DIAGNOSIS — R0602 Shortness of breath: Secondary | ICD-10-CM | POA: Diagnosis not present

## 2017-10-16 DIAGNOSIS — E119 Type 2 diabetes mellitus without complications: Secondary | ICD-10-CM | POA: Diagnosis not present

## 2017-10-16 DIAGNOSIS — I10 Essential (primary) hypertension: Secondary | ICD-10-CM | POA: Diagnosis not present

## 2017-10-16 DIAGNOSIS — J45998 Other asthma: Secondary | ICD-10-CM | POA: Diagnosis not present

## 2017-10-17 DIAGNOSIS — I10 Essential (primary) hypertension: Secondary | ICD-10-CM | POA: Diagnosis not present

## 2017-10-17 DIAGNOSIS — E119 Type 2 diabetes mellitus without complications: Secondary | ICD-10-CM | POA: Diagnosis not present

## 2017-10-17 DIAGNOSIS — J45998 Other asthma: Secondary | ICD-10-CM | POA: Diagnosis not present

## 2017-10-17 DIAGNOSIS — R05 Cough: Secondary | ICD-10-CM | POA: Diagnosis not present

## 2017-10-17 DIAGNOSIS — R0602 Shortness of breath: Secondary | ICD-10-CM | POA: Diagnosis not present

## 2017-10-18 DIAGNOSIS — E119 Type 2 diabetes mellitus without complications: Secondary | ICD-10-CM | POA: Diagnosis not present

## 2017-10-18 DIAGNOSIS — R0602 Shortness of breath: Secondary | ICD-10-CM | POA: Diagnosis not present

## 2017-10-18 DIAGNOSIS — I10 Essential (primary) hypertension: Secondary | ICD-10-CM | POA: Diagnosis not present

## 2017-10-18 DIAGNOSIS — J45998 Other asthma: Secondary | ICD-10-CM | POA: Diagnosis not present

## 2017-11-15 DIAGNOSIS — J020 Streptococcal pharyngitis: Secondary | ICD-10-CM | POA: Insufficient documentation

## 2017-11-28 DIAGNOSIS — M5136 Other intervertebral disc degeneration, lumbar region: Secondary | ICD-10-CM | POA: Insufficient documentation

## 2017-11-28 DIAGNOSIS — M51369 Other intervertebral disc degeneration, lumbar region without mention of lumbar back pain or lower extremity pain: Secondary | ICD-10-CM

## 2017-11-28 HISTORY — DX: Other intervertebral disc degeneration, lumbar region: M51.36

## 2017-11-28 HISTORY — DX: Other intervertebral disc degeneration, lumbar region without mention of lumbar back pain or lower extremity pain: M51.369

## 2017-12-13 DIAGNOSIS — M48061 Spinal stenosis, lumbar region without neurogenic claudication: Secondary | ICD-10-CM | POA: Insufficient documentation

## 2017-12-13 HISTORY — DX: Spinal stenosis, lumbar region without neurogenic claudication: M48.061

## 2017-12-30 DIAGNOSIS — J9601 Acute respiratory failure with hypoxia: Secondary | ICD-10-CM | POA: Diagnosis not present

## 2017-12-30 DIAGNOSIS — R0602 Shortness of breath: Secondary | ICD-10-CM | POA: Diagnosis not present

## 2017-12-30 DIAGNOSIS — E119 Type 2 diabetes mellitus without complications: Secondary | ICD-10-CM | POA: Diagnosis not present

## 2017-12-30 DIAGNOSIS — E785 Hyperlipidemia, unspecified: Secondary | ICD-10-CM | POA: Diagnosis not present

## 2017-12-30 DIAGNOSIS — I10 Essential (primary) hypertension: Secondary | ICD-10-CM | POA: Diagnosis not present

## 2017-12-30 DIAGNOSIS — J45901 Unspecified asthma with (acute) exacerbation: Secondary | ICD-10-CM | POA: Diagnosis not present

## 2017-12-31 DIAGNOSIS — E785 Hyperlipidemia, unspecified: Secondary | ICD-10-CM | POA: Diagnosis not present

## 2017-12-31 DIAGNOSIS — J45901 Unspecified asthma with (acute) exacerbation: Secondary | ICD-10-CM | POA: Diagnosis not present

## 2017-12-31 DIAGNOSIS — E119 Type 2 diabetes mellitus without complications: Secondary | ICD-10-CM | POA: Diagnosis not present

## 2017-12-31 DIAGNOSIS — J9601 Acute respiratory failure with hypoxia: Secondary | ICD-10-CM | POA: Diagnosis not present

## 2017-12-31 DIAGNOSIS — I10 Essential (primary) hypertension: Secondary | ICD-10-CM | POA: Diagnosis not present

## 2017-12-31 DIAGNOSIS — R0602 Shortness of breath: Secondary | ICD-10-CM | POA: Diagnosis not present

## 2018-01-03 DIAGNOSIS — R0602 Shortness of breath: Secondary | ICD-10-CM | POA: Diagnosis not present

## 2018-08-09 DIAGNOSIS — M659 Synovitis and tenosynovitis, unspecified: Secondary | ICD-10-CM | POA: Insufficient documentation

## 2018-08-09 DIAGNOSIS — G5601 Carpal tunnel syndrome, right upper limb: Secondary | ICD-10-CM | POA: Insufficient documentation

## 2018-08-09 DIAGNOSIS — M65942 Unspecified synovitis and tenosynovitis, left hand: Secondary | ICD-10-CM

## 2018-08-09 DIAGNOSIS — M79641 Pain in right hand: Secondary | ICD-10-CM | POA: Insufficient documentation

## 2018-08-09 HISTORY — DX: Carpal tunnel syndrome, right upper limb: G56.01

## 2018-08-09 HISTORY — DX: Pain in right hand: M79.641

## 2018-08-09 HISTORY — DX: Synovitis and tenosynovitis, unspecified: M65.9

## 2018-08-09 HISTORY — DX: Unspecified synovitis and tenosynovitis, left hand: M65.942

## 2019-05-20 DIAGNOSIS — R070 Pain in throat: Secondary | ICD-10-CM | POA: Insufficient documentation

## 2019-05-20 DIAGNOSIS — R053 Chronic cough: Secondary | ICD-10-CM | POA: Insufficient documentation

## 2019-07-12 DIAGNOSIS — A020 Salmonella enteritis: Secondary | ICD-10-CM | POA: Insufficient documentation

## 2019-08-12 DIAGNOSIS — Z01818 Encounter for other preprocedural examination: Secondary | ICD-10-CM

## 2019-08-29 DIAGNOSIS — I10 Essential (primary) hypertension: Secondary | ICD-10-CM

## 2019-08-29 DIAGNOSIS — E6609 Other obesity due to excess calories: Secondary | ICD-10-CM

## 2019-08-29 DIAGNOSIS — E785 Hyperlipidemia, unspecified: Secondary | ICD-10-CM

## 2019-08-29 DIAGNOSIS — E66812 Obesity, class 2: Secondary | ICD-10-CM

## 2019-08-29 DIAGNOSIS — E119 Type 2 diabetes mellitus without complications: Secondary | ICD-10-CM | POA: Insufficient documentation

## 2019-08-29 HISTORY — DX: Type 2 diabetes mellitus without complications: E11.9

## 2019-08-29 HISTORY — DX: Other obesity due to excess calories: E66.09

## 2019-08-29 HISTORY — DX: Essential (primary) hypertension: I10

## 2019-08-29 HISTORY — DX: Hyperlipidemia, unspecified: E78.5

## 2019-08-29 HISTORY — DX: Obesity, class 2: E66.812

## 2019-09-11 DIAGNOSIS — R300 Dysuria: Secondary | ICD-10-CM | POA: Insufficient documentation

## 2019-09-11 DIAGNOSIS — N390 Urinary tract infection, site not specified: Secondary | ICD-10-CM | POA: Insufficient documentation

## 2019-10-07 HISTORY — PX: CERVICAL FUSION: SHX112

## 2020-05-06 DIAGNOSIS — K56699 Other intestinal obstruction unspecified as to partial versus complete obstruction: Secondary | ICD-10-CM

## 2020-05-06 DIAGNOSIS — K529 Noninfective gastroenteritis and colitis, unspecified: Secondary | ICD-10-CM

## 2020-05-06 DIAGNOSIS — D500 Iron deficiency anemia secondary to blood loss (chronic): Secondary | ICD-10-CM

## 2020-05-06 DIAGNOSIS — E538 Deficiency of other specified B group vitamins: Secondary | ICD-10-CM | POA: Insufficient documentation

## 2020-05-06 HISTORY — DX: Iron deficiency anemia secondary to blood loss (chronic): D50.0

## 2020-05-06 HISTORY — DX: Deficiency of other specified B group vitamins: E53.8

## 2020-05-06 HISTORY — DX: Noninfective gastroenteritis and colitis, unspecified: K52.9

## 2020-05-06 HISTORY — DX: Other intestinal obstruction unspecified as to partial versus complete obstruction: K56.699

## 2020-07-29 DIAGNOSIS — J4520 Mild intermittent asthma, uncomplicated: Secondary | ICD-10-CM | POA: Insufficient documentation

## 2020-07-29 HISTORY — DX: Mild intermittent asthma, uncomplicated: J45.20

## 2020-12-04 DIAGNOSIS — Z79899 Other long term (current) drug therapy: Secondary | ICD-10-CM | POA: Insufficient documentation

## 2021-03-10 ENCOUNTER — Telehealth: Payer: Self-pay | Admitting: Gastroenterology

## 2021-03-10 NOTE — Telephone Encounter (Signed)
Good morning Dr. Fuller Plan, we received a referral for this patient.  She has seen a GI within the last year but states her insurance changed and they are longer in network.  Will send referral to you.  Records are also in Epic.  Can you please review and advise on scheduling?  Thank you.

## 2021-03-10 NOTE — Telephone Encounter (Signed)
OK to transfer care to schedule with Dr. Candis Schatz or Dr. Lorenso Courier.

## 2021-03-11 ENCOUNTER — Encounter: Payer: Self-pay | Admitting: Gastroenterology

## 2021-03-11 NOTE — Telephone Encounter (Signed)
Patient scheduled for 04/16/21 with Dr. Candis Schatz.

## 2021-04-16 ENCOUNTER — Encounter: Payer: Self-pay | Admitting: Gastroenterology

## 2021-04-16 ENCOUNTER — Ambulatory Visit (INDEPENDENT_AMBULATORY_CARE_PROVIDER_SITE_OTHER): Payer: 59 | Admitting: Gastroenterology

## 2021-04-16 ENCOUNTER — Other Ambulatory Visit (INDEPENDENT_AMBULATORY_CARE_PROVIDER_SITE_OTHER): Payer: 59

## 2021-04-16 VITALS — BP 132/70 | HR 84 | Ht 59.0 in | Wt 189.0 lb

## 2021-04-16 DIAGNOSIS — K5900 Constipation, unspecified: Secondary | ICD-10-CM

## 2021-04-16 DIAGNOSIS — K529 Noninfective gastroenteritis and colitis, unspecified: Secondary | ICD-10-CM

## 2021-04-16 DIAGNOSIS — K56699 Other intestinal obstruction unspecified as to partial versus complete obstruction: Secondary | ICD-10-CM

## 2021-04-16 LAB — SEDIMENTATION RATE: Sed Rate: 9 mm/hr (ref 0–30)

## 2021-04-16 LAB — C-REACTIVE PROTEIN: CRP: 1 mg/dL (ref 0.5–20.0)

## 2021-04-16 MED ORDER — PLENVU 140 G PO SOLR: ORAL | 0 refills | Status: DC

## 2021-04-16 MED ORDER — SENNA 8.6 MG PO TABS: 1.0000 | ORAL_TABLET | ORAL | 2 refills | Status: DC

## 2021-04-16 NOTE — Patient Instructions (Addendum)
If you are age 58 or older, your body mass index should be between 23-30. Your Body mass index is 38.17 kg/m. If this is out of the aforementioned range listed, please consider follow up with your Primary Care Provider.  If you are age 63 or younger, your body mass index should be between 19-25. Your Body mass index is 38.17 kg/m. If this is out of the aformentioned range listed, please consider follow up with your Primary Care Provider.   Your provider has requested that you go to the basement level for lab work before leaving today. Press "B" on the elevator. The lab is located at the first door on the left as you exit the elevator.   You have been scheduled for an endoscopy and colonoscopy. Please follow the written instructions given to you at your visit today. Please pick up your prep supplies at the pharmacy within the next 1-3 days. If you use inhalers (even only as needed), please bring them with you on the day of your procedure.  We have sent the following medications to your pharmacy for you to pick up at your convenience:Senna 8.6 mg  The [facility] providers would like to encourage you to use [facility] to communicate with providers for non-urgent requests or questions.  Due to long hold times on the telephone, sending your provider a message by [facility] may be a faster and more efficient way to get a response.  Please allow 48 business hours for a response.  Please remember that this is for non-urgent requests.   It was a pleasure to see you today!  Thank you for trusting me with your gastrointestinal care!    Scott E.Candis Schatz, MD

## 2021-04-16 NOTE — Progress Notes (Signed)
HPI : [name] is a pleasant 60 year old female referred by Heide Scales FNP for continued GI care.  She was followed by Dr. Marin Comment of [place] until her health insurance changed and she was no longer covered on this provider.  The patient reports a 2+ year history of symptoms of abdominal pain, bloating and constipation.  She has occasional bouts of nausea without vomiting.  She denies problems with diarrhea unless it is induced by laxatives.  She denies a history of bright red blood per rectum, but does report seeing black tarry stools as well as black hard stools.  She has a history of iron deficiency anemia and does take iron supplements.  She has a history of occasional heartburn which is usually well controlled with once daily pantoprazole. She has had multiple upper and lower endoscopies.  In January 2021, her colonoscopy revealed deep serpiginous ulcers, with biopsies demonstrating nonspecific acute and chronic inflammation.  An EGD at that time showed an inflamed pylorus.  A subsequent abdominal CTA showed no significant mesenteric vascular disease.  Small bowel follow-through was negative for Crohn's enteritis.  Sprue serologies negative.  Repeat colonoscopy in June 2021 was incomplete given a stricture at the distal transverse colon/splenic flexure.  Dilation was attempted with a 15 mm balloon but the adult colonoscope was still unable to traverse the stenosis.  Biopsies again showed nonspecific acute and chronic inflammation.  A barium enema after this did not reveal a significant stenosis; the cecal base was visualized.  Last note from Dr. Marin Comment was in August 2021.  It was felt that she most likely had Crohn's colitis versus NSAID enteropathy.  He scheduled her for a repeat EGD and colonoscopy with plans for dilation under fluoroscopy.  It does not appear these procedures were performed due to insurance limitations. Since then, she states that her symptoms have been essentially the same.   The patient bloating and abdominal pain are her most bothersome symptoms.  The pain is described as a burning sensation, sometimes in the epigastrium sometimes in the right lower quadrant.  She takes MiraLAX most days for constipation which does help, however if she takes it every day, her stools will become loose and watery.  She denies any weight loss, rather she has gained about 10 pounds in the last month. She reports that she has not taken any NSAIDs since Dr. Truman Hayward told her not to a year ago.  She had been taking Voltaren daily prior to that.  Currently, she takes Tylenol as needed for musculoskeletal pain. Recent labs from her referring provider show a normal CBC, normal CMP and normal iron panel  Past Medical History:  Diagnosis Date   Anxiety    Asthma    Depression    GERD (gastroesophageal reflux disease)    Hypertension    Sjogren's disease ([place])      Past Surgical History:  Procedure Laterality Date   ABDOMINAL HYSTERECTOMY     CESAREAN SECTION     No family history on file. Social History   Tobacco Use   Smoking status: Never   Smokeless tobacco: Never  Substance Use Topics   Alcohol use: No    Alcohol/week: 0.0 standard drinks   Drug use: No   Current Outpatient Medications  Medication Sig Dispense Refill   ALPRAZolam (XANAX) 0.5 MG tablet   0   diclofenac (VOLTAREN) 75 MG EC tablet Take 1 tablet (75 mg total) by mouth 2 (two) times daily. 30 tablet 0  DULoxetine (CYMBALTA) 30 MG capsule   0   escitalopram (LEXAPRO) 20 MG tablet   1   gemfibrozil (LOPID) 600 MG tablet   0   HYDROcodone-acetaminophen (NORCO/VICODIN) 5-325 MG per tablet   0   hydroxychloroquine (PLAQUENIL) 200 MG tablet   0   lisinopril-hydrochlorothiazide (PRINZIDE,ZESTORETIC) 20-25 MG per tablet   0   loratadine (CLARITIN) 10 MG tablet Take 10 mg by mouth daily.     LYRICA 100 MG capsule   0   meloxicam (MOBIC) 15 MG tablet Take 1 tablet (15 mg total) by mouth daily. 30 tablet 1    methylPREDNISolone (MEDROL DOSEPAK) 4 MG TBPK tablet Dosepack; take as instructed 21 tablet 0   ofloxacin (FLOXIN) 0.3 % otic solution   0   omeprazole (PRILOSEC) 40 MG capsule   0   oxaprozin (DAYPRO) 600 MG tablet ONE TABLET TWICE A DAY 30 tablet 2   potassium chloride SA (K-DUR,KLOR-CON) 20 MEQ tablet Take 20 mEq by mouth 2 (two) times daily.     PROAIR HFA 108 (90 BASE) MCG/ACT inhaler   0   triamcinolone (KENALOG) 0.025 % cream Apply 1 application topically 2 (two) times daily.     Current Facility-Administered Medications  Medication Dose Route Frequency Provider Last Rate Last Admin   triamcinolone acetonide (KENALOG) 10 MG/ML injection 10 mg  10 mg Other Once Landis Martins, DPM       triamcinolone acetonide (KENALOG) 10 MG/ML injection 10 mg  10 mg Other Once Landis Martins, DPM       Allergies  Allergen Reactions   Lisinopril Shortness Of Breath    Bloating      Review of Systems: Her review of systems was positive for allergy/sinus trouble, anxiety, back pain, blood in urine, cough, depression, fatigue, fever, headaches, itching, muscle pain/cramps, shortness of breath, skin rash, sleeping problems, sore throat, excessive thirst, urination pain  Review of systems was negative for arthritis, breast changes, vision changes, confusion, coughing up blood, hearing problems, heart rhythm changes, heart murmur, menstrual pain, night sweats, nosebleeds, swelling of feet/legs, swollen lymph glands, excessive urination, urine leakage, voice changes   No results found.  Physical Exam: Ht 4' 11"  (1.499 m)   Wt 189 lb (85.7 kg)   BMI 38.17 kg/m  Constitutional: Pleasant,well-developed, Caucasian female in no acute distress.  Accompanied by son HEENT: Normocephalic and atraumatic. Conjunctivae are normal. No scleral icterus.  Mallampati 2 Cardiovascular: Normal rate, regular rhythm.  Pulmonary/chest: Effort normal and breath sounds normal. No wheezing, rales or rhonchi. Abdominal:  Soft, nondistended, nontender. Bowel sounds active throughout. There are no masses palpable. No hepatomegaly. Extremities: no edema Lymphadenopathy: No cervical adenopathy noted. Neurological: Alert and oriented to person place and time. Skin: Skin is warm and dry. No rashes noted. Psychiatric: Normal mood and affect. Behavior is normal.  CBC No results found for: WBC, RBC, HGB, HCT, PLT, MCV, MCH, MCHC, RDW, LYMPHSABS, MONOABS, EOSABS, BASOSABS December 04, 2020 CBC hemoglobin 13.3 with an MCV of 86 CMP unremarkable Serum iron 96, ferritin 70, TIBC 312, iron sat 31%  CMP  No results found for: NA, K, CL, CO2, GLUCOSE, BUN, CREATININE, CALCIUM, PROT, ALBUMIN, AST, ALT, ALKPHOS, BILITOT, GFRNONAA, GFRAA   ASSESSMENT AND PLAN: 60 year old female with several year history of abdominal pain, constipation and bloating with abnormal endoscopies demonstrating serpiginous ulceration and colonic stricture concerning for Crohn's disease versus NSAID colopathy.  Her last endoscopic examination was in June 2021.  The patient states that she has been off all  NSAIDs for over a year now.  Her symptoms are not significantly changed since stopping the NSAIDs.  Reassuringly, she has not had any weight loss and is not anemic.  I suspect that her endoscopic findings were most likely NSAID related and that she likely has IBS, but we need to repeat upper and lower endoscopy as originally planned to exclude Crohn's disease.  We will get inflammatory markers including fecal calprotectin.  If these are elevated, and her endoscopies are unremarkable, we may need to reimage the small intestine.  For her constipation, I recommend that she try taking the MiraLAX every other day and take senna as needed.  History of colonic stricture/ulceration/inflammation, NSAID induced versus Crohn's - Repeat colonoscopy with pediatric colonoscope - Inflammatory markers (CRP, ESR, fecal calprotectin)  History of pyloric inflammation  stenosis - Repeat EGD  Constipation - MiraLAX every other day, with as needed senna  The details, risks (including bleeding, perforation, infection, missed lesions, medication reactions and possible hospitalization or surgery if complications occur), benefits, and alternatives to EGD/colonoscopy with possible biopsy and possible polypectomy were discussed with the patient and she consents to proceed.   I spent a total of 45 minutes reviewing the patient's medical record, interviewing and examining the patient, discussing her diagnosis and management of her condition going forward, and documenting in the medical record  Elvenia Godden E. Candis Schatz, MD [facility]     Imagene Riches, NP

## 2021-04-20 LAB — CALPROTECTIN, FECAL: Calprotectin, Fecal: 128 ug/g — ABNORMAL HIGH (ref 0–120)

## 2021-04-27 NOTE — Progress Notes (Signed)
Tandy, your CRP and ESR (blood based inflammatory markers) were completely normal.  Your fecal calprotectin (stool based inflammatory marker) was very slightly elevated.  Small elevations are nonspecific and I do not think we need to do anything else before your colonoscopy.  The colonoscopy findings will be most helpful for determining whether you have Crohn's disease or NSAID-colopathy.

## 2021-05-27 ENCOUNTER — Encounter: Payer: Self-pay | Admitting: Gastroenterology

## 2021-05-27 ENCOUNTER — Ambulatory Visit (AMBULATORY_SURGERY_CENTER): Payer: 59 | Admitting: Gastroenterology

## 2021-05-27 ENCOUNTER — Other Ambulatory Visit: Payer: Self-pay

## 2021-05-27 VITALS — BP 144/56 | HR 80 | Temp 97.3°F | Resp 16 | Ht 59.0 in | Wt 189.0 lb

## 2021-05-27 DIAGNOSIS — K5730 Diverticulosis of large intestine without perforation or abscess without bleeding: Secondary | ICD-10-CM | POA: Diagnosis not present

## 2021-05-27 DIAGNOSIS — D123 Benign neoplasm of transverse colon: Secondary | ICD-10-CM | POA: Diagnosis not present

## 2021-05-27 DIAGNOSIS — K56699 Other intestinal obstruction unspecified as to partial versus complete obstruction: Secondary | ICD-10-CM | POA: Diagnosis not present

## 2021-05-27 DIAGNOSIS — K529 Noninfective gastroenteritis and colitis, unspecified: Secondary | ICD-10-CM | POA: Diagnosis not present

## 2021-05-27 DIAGNOSIS — K3189 Other diseases of stomach and duodenum: Secondary | ICD-10-CM | POA: Diagnosis not present

## 2021-05-27 DIAGNOSIS — K635 Polyp of colon: Secondary | ICD-10-CM

## 2021-05-27 DIAGNOSIS — R1084 Generalized abdominal pain: Secondary | ICD-10-CM

## 2021-05-27 DIAGNOSIS — K5900 Constipation, unspecified: Secondary | ICD-10-CM

## 2021-05-27 MED ORDER — SODIUM CHLORIDE 0.9 % IV SOLN: 500.0000 mL | Freq: Once | INTRAVENOUS | Status: DC

## 2021-05-27 NOTE — Progress Notes (Signed)
VS completed by DT.    Medical history reviewed and updated.  

## 2021-05-27 NOTE — Progress Notes (Signed)
Called to room to assist during endoscopic procedure.  Patient ID and intended procedure confirmed with present staff. Received instructions for my participation in the procedure from the performing physician.  

## 2021-05-27 NOTE — Patient Instructions (Addendum)
Handouts on diverticulosis and polyps given to you today    YOU HAD AN ENDOSCOPIC PROCEDURE TODAY AT [place]:   Refer to the procedure report that was given to you for any specific questions about what was found during the examination.  If the procedure report does not answer your questions, please call your gastroenterologist to clarify.  If you requested that your care partner not be given the details of your procedure findings, then the procedure report has been included in a sealed envelope for you to review at your convenience later.  YOU SHOULD EXPECT: Some feelings of bloating in the abdomen. Passage of more gas than usual.  Walking can help get rid of the air that was put into your GI tract during the procedure and reduce the bloating. If you had a lower endoscopy (such as a colonoscopy or flexible sigmoidoscopy) you may notice spotting of blood in your stool or on the toilet paper. If you underwent a bowel prep for your procedure, you may not have a normal bowel movement for a few days.  Please Note:  You might notice some irritation and congestion in your nose or some drainage.  This is from the oxygen used during your procedure.  There is no need for concern and it should clear up in a day or so.  SYMPTOMS TO REPORT IMMEDIATELY:  Following lower endoscopy (colonoscopy or flexible sigmoidoscopy):  Excessive amounts of blood in the stool  Significant tenderness or worsening of abdominal pains  Swelling of the abdomen that is new, acute  Fever of 100F or higher  Following upper endoscopy (EGD)  Vomiting of blood or coffee ground material  New chest pain or pain under the shoulder blades  Painful or persistently difficult swallowing  New shortness of breath  Fever of 100F or higher  Black, tarry-looking stools  For urgent or emergent issues, a gastroenterologist can be reached at any hour by calling [phone]. Do not use MyChart messaging for urgent  concerns.    DIET:  We do recommend a small meal at first, but then you may proceed to your regular diet.  Drink plenty of fluids but you should avoid alcoholic beverages for 24 hours.  ACTIVITY:  You should plan to take it easy for the rest of today and you should NOT DRIVE or use heavy machinery until tomorrow (because of the sedation medicines used during the test).    FOLLOW UP: Our staff will call the number listed on your records 48-72 hours following your procedure to check on you and address any questions or concerns that you may have regarding the information given to you following your procedure. If we do not reach you, we will leave a message.  We will attempt to reach you two times.  During this call, we will ask if you have developed any symptoms of COVID 19. If you develop any symptoms (ie: fever, flu-like symptoms, shortness of breath, cough etc.) before then, please call [phone].  If you test positive for Covid 19 in the 2 weeks post procedure, please call and report this information to [place].    If any biopsies were taken you will be contacted by phone or by letter within the next 1-3 weeks.  Please call us at [phone] if you have not heard about the biopsies in 3 weeks.    SIGNATURES/CONFIDENTIALITY: You and/or your care partner have signed paperwork which will be entered into your electronic medical record.  These signatures  attest to the fact that that the information above on your After Visit Summary has been reviewed and is understood.  Full responsibility of the confidentiality of this discharge information lies with you and/or your care-partner.

## 2021-05-27 NOTE — Progress Notes (Signed)
[facility] History and Physical   Primary Care Physician:  Imagene Riches, NP   Reason for Procedure:   Abdominal pain, history of gastritis, colitis and colonic stricture  Plan:    EGD and colonoscopy to re-evaluate for Crohn's disease vs. NSAID enterocolopathy     HPI: Crystal Marquez is a 60 y.o. female who I saw on July 29th for abdominal pain, bloating and constipation.  She has had GI symptoms for several years and had multiple endoscopic examinations at [facility] which showed colonic ulceration and stenosis suggestive of Crohn's disease, but biopsies did not demonstrate chronic inflammatory changes, and patient had also been taking NSAIDs around that time. An EGD showed pyloric inflammation. She currently continues to have abdominal pain and bloating, but has not taken NSAIDs in over a year.  CRP and ESR were normal, fecal calprotectin was slightly elevated.  No change in her symptoms since I saw her in clinic.   Past Medical History:  Diagnosis Date   Anxiety    Asthma    Colon polyp    Depression    GERD (gastroesophageal reflux disease)    Hypertension    Sjogren's disease ([place])     Past Surgical History:  Procedure Laterality Date   ABDOMINAL HYSTERECTOMY     CESAREAN SECTION     COLONOSCOPY     UPPER GASTROINTESTINAL ENDOSCOPY      Prior to Admission medications   Medication Sig Start Date End Date Taking? Authorizing Provider  ALPRAZolam Duanne Moron) 0.5 MG tablet  11/11/14  Yes [provider]  buPROPion (WELLBUTRIN SR) 150 MG 12 hr tablet Take 150 mg by mouth daily.   Yes [provider]  DULoxetine (CYMBALTA) 30 MG capsule  12/23/14  Yes [provider]  ferrous sulfate 325 (65 FE) MG tablet Take 325 mg by mouth daily with breakfast.   Yes [provider]  hydroxychloroquine (PLAQUENIL) 200 MG tablet  10/17/14  Yes [provider]  loratadine (CLARITIN) 10 MG tablet Take 10 mg by mouth daily.   Yes  [provider]  metFORMIN (GLUCOPHAGE) 500 MG tablet Take by mouth 2 (two) times daily with a meal.   Yes [provider]  olmesartan-hydrochlorothiazide (BENICAR HCT) 40-25 MG tablet Take 1 tablet by mouth daily.   Yes [provider]  pantoprazole (PROTONIX) 40 MG tablet Take 40 mg by mouth daily.   Yes [provider]  potassium chloride SA (K-DUR,KLOR-CON) 20 MEQ tablet Take 20 mEq by mouth 2 (two) times daily.   Yes [provider]  pregabalin (LYRICA) 150 MG capsule Take 150 mg by mouth daily.   Yes [provider]  PROAIR HFA 108 (90 BASE) MCG/ACT inhaler  11/11/14  Yes [provider]  promethazine (PHENERGAN) 12.5 MG tablet Take 12.5 mg by mouth every 6 (six) hours as needed for nausea or vomiting.   Yes [provider]  senna (SENOKOT) 8.6 MG TABS tablet Take 1 tablet (8.6 mg total) by mouth every other day. 04/16/21  Yes Daryel November, MD  diclofenac (VOLTAREN) 75 MG EC tablet Take 1 tablet (75 mg total) by mouth 2 (two) times daily. Patient not taking: Reported on 05/27/2021 09/02/15   Landis Martins, DPM  ofloxacin (FLOXIN) 0.3 % otic solution  12/03/14   [provider]  triamcinolone (KENALOG) 0.025 % cream Apply 1 application topically 2 (two) times daily. Patient not taking: Reported on 05/27/2021    [provider]    Current  Outpatient Medications  Medication Sig Dispense Refill   ALPRAZolam (XANAX) 0.5 MG tablet   0   buPROPion (WELLBUTRIN SR) 150 MG 12 hr tablet Take 150 mg by mouth daily.     DULoxetine (CYMBALTA) 30 MG capsule   0   ferrous sulfate 325 (65 FE) MG tablet Take 325 mg by mouth daily with breakfast.     hydroxychloroquine (PLAQUENIL) 200 MG tablet   0   loratadine (CLARITIN) 10 MG tablet Take 10 mg by mouth daily.     metFORMIN (GLUCOPHAGE) 500 MG tablet Take by mouth 2 (two) times daily with a meal.     olmesartan-hydrochlorothiazide (BENICAR HCT) 40-25 MG tablet  Take 1 tablet by mouth daily.     pantoprazole (PROTONIX) 40 MG tablet Take 40 mg by mouth daily.     potassium chloride SA (K-DUR,KLOR-CON) 20 MEQ tablet Take 20 mEq by mouth 2 (two) times daily.     pregabalin (LYRICA) 150 MG capsule Take 150 mg by mouth daily.     PROAIR HFA 108 (90 BASE) MCG/ACT inhaler   0   promethazine (PHENERGAN) 12.5 MG tablet Take 12.5 mg by mouth every 6 (six) hours as needed for nausea or vomiting.     senna (SENOKOT) 8.6 MG TABS tablet Take 1 tablet (8.6 mg total) by mouth every other day. 30 tablet 2   diclofenac (VOLTAREN) 75 MG EC tablet Take 1 tablet (75 mg total) by mouth 2 (two) times daily. (Patient not taking: Reported on 05/27/2021) 30 tablet 0   ofloxacin (FLOXIN) 0.3 % otic solution   0   triamcinolone (KENALOG) 0.025 % cream Apply 1 application topically 2 (two) times daily. (Patient not taking: Reported on 05/27/2021)     Current Facility-Administered Medications  Medication Dose Route Frequency Provider Last Rate Last Admin   0.9 %  sodium chloride infusion  500 mL Intravenous Once Daryel November, MD       triamcinolone acetonide (KENALOG) 10 MG/ML injection 10 mg  10 mg Other Once Landis Martins, DPM       triamcinolone acetonide (KENALOG) 10 MG/ML injection 10 mg  10 mg Other Once Landis Martins, DPM        Allergies as of 05/27/2021 - Review Complete 05/27/2021  Allergen Reaction Noted   Lisinopril Shortness Of Breath 04/16/2021    Family History  Problem Relation Age of Onset   Heart disease Father    Colon polyps Father    Colon polyps Sister    Crohn's disease Sister    Diverticulitis Sister    Kidney disease Brother    Heart disease Maternal Grandmother    Heart disease Paternal Grandmother    Colon cancer Neg Hx    Rectal cancer Neg Hx    Stomach cancer Neg Hx     Social History   Socioeconomic History   Marital status: Married    Spouse name: Not on file   Number of children: Not on file   Years of education: Not on  file   Highest education level: Not on file  Occupational History   Not on file  Tobacco Use   Smoking status: Never   Smokeless tobacco: Never  Vaping Use   Vaping Use: Never used  Substance and Sexual Activity   Alcohol use: No    Alcohol/week: 0.0 standard drinks   Drug use: No   Sexual activity: Not on file  Other Topics Concern   Not on file  Social History Narrative  Not on file   Social Determinants of Health   Financial Resource Strain: Not on file  Food Insecurity: Not on file  Transportation Needs: Not on file  Physical Activity: Not on file  Stress: Not on file  Social Connections: Not on file  Intimate Partner Violence: Not on file    Review of Systems:  All other review of systems negative except as mentioned in the HPI.  Physical Exam: Vital signs BP (!) 156/89   Pulse 87   Temp (!) 97.3 F (36.3 C) (Temporal)   Ht 4' 11"  (1.499 m)   Wt 189 lb (85.7 kg)   SpO2 97%   BMI 38.17 kg/m   General:   Alert,  Well-developed, well-nourished, pleasant and cooperative in NAD Lungs:  Clear throughout to auscultation.   Heart:  Regular rate and rhythm; no murmurs, clicks, rubs,  or gallops. Abdomen:  Soft, nontender and nondistended. Normal bowel sounds.   Neuro/Psych:  Normal mood and affect. A and O x 3  Maximilliano Kersh E. Candis Schatz, MD [facility]

## 2021-05-27 NOTE — Op Note (Signed)
[facility] Patient Name: Crystal Marquez Procedure Date: 05/27/2021 2:14 PM MRN: [id number] Endoscopist: Nicki Reaper E. Candis Schatz , MD Age: 60 Referring MD:  Date of Birth: 12/03/1960 Gender: Female Account #: [id number] Procedure:                Upper GI endoscopy Indications:              Generalized abdominal pain Medicines:                Monitored Anesthesia Care Procedure:                Pre-Anesthesia Assessment:                           - Prior to the procedure, a History and Physical                            was performed, and patient medications and                            allergies were reviewed. The patient's tolerance of                            previous anesthesia was also reviewed. The risks                            and benefits of the procedure and the sedation                            options and risks were discussed with the patient.                            All questions were answered, and informed consent                            was obtained. Prior Anticoagulants: The patient has                            taken no previous anticoagulant or antiplatelet                            agents. ASA Grade Assessment: III - A patient with                            severe systemic disease. After reviewing the risks                            and benefits, the patient was deemed in                            satisfactory condition to undergo the procedure.                           After obtaining informed consent, the endoscope was  passed under direct vision. Throughout the                            procedure, the patient's blood pressure, pulse, and                            oxygen saturations were monitored continuously. The                            GIF [id number] was introduced through the                            mouth, and advanced to the third part of duodenum.                            The upper GI endoscopy was  accomplished without                            difficulty. The patient tolerated the procedure                            well. Scope In: Scope Out: Findings:                 The examined portions of the nasopharynx,                            oropharynx and larynx were normal.                           The examined esophagus was normal.                           Diffuse mildly erythematous mucosa without bleeding                            was found in the gastric body. Biopsies were taken                            with a cold forceps for Helicobacter pylori                            testing. Estimated blood loss was minimal.                           A small amount of food and medication (residue) as                            well as bile was found in the gastric body.                           The exam of the stomach was otherwise normal.                           The examined duodenum was normal.  Complications:            No immediate complications. Estimated Blood Loss:     Estimated blood loss was minimal. Impression:               - The examined portions of the nasopharynx,                            oropharynx and larynx were normal.                           - Normal esophagus.                           - Erythematous mucosa in the gastric body. Biopsied.                           - A small amount of food (residue) in the stomach.                           - Normal examined duodenum. Recommendation:           - Patient has a contact number available for                            emergencies. The signs and symptoms of potential                            delayed complications were discussed with the                            patient. Return to normal activities tomorrow.                            Written discharge instructions were provided to the                            patient.                           - Resume previous diet.                           - Continue  present medications.                           - Await pathology results. Jleigh Striplin E. Candis Schatz, MD 05/27/2021 3:06:35 PM This report has been signed electronically.

## 2021-05-27 NOTE — Progress Notes (Signed)
To pacu, VSS. Report to [email or web address] 

## 2021-05-27 NOTE — Op Note (Signed)
[facility] Patient Name: Crystal Marquez Procedure Date: 05/27/2021 2:13 PM MRN: [id number] Endoscopist: Nicki Reaper E. Candis Schatz , MD Age: 60 Referring MD:  Date of Birth: 02-13-1961 Gender: Female Account #: [id number] Procedure:                Colonoscopy Indications:              Generalized abdominal pain, Exclusion of Crohn's                            disease of the colon Medicines:                Monitored Anesthesia Care Procedure:                Pre-Anesthesia Assessment:                           - Prior to the procedure, a History and Physical                            was performed, and patient medications and                            allergies were reviewed. The patient's tolerance of                            previous anesthesia was also reviewed. The risks                            and benefits of the procedure and the sedation                            options and risks were discussed with the patient.                            All questions were answered, and informed consent                            was obtained. Prior Anticoagulants: The patient has                            taken no previous anticoagulant or antiplatelet                            agents. ASA Grade Assessment: III - A patient with                            severe systemic disease. After reviewing the risks                            and benefits, the patient was deemed in                            satisfactory condition to undergo the procedure.  After obtaining informed consent, the colonoscope                            was passed under direct vision. Throughout the                            procedure, the patient's blood pressure, pulse, and                            oxygen saturations were monitored continuously. The                            CF [id number] was introduced through the anus                            and advanced to the the terminal  ileum, with                            identification of the appendiceal orifice and IC                            valve. The colonoscopy was performed without                            difficulty. The patient tolerated the procedure                            well. The quality of the bowel preparation was                            adequate. The terminal ileum, ileocecal valve,                            appendiceal orifice, and rectum were photographed. Scope In: 2:37:24 PM Scope Out: 2:59:04 PM Scope Withdrawal Time: 0 hours 16 minutes 13 seconds  Total Procedure Duration: 0 hours 21 minutes 40 seconds  Findings:                 The perianal and digital rectal examinations were                            normal. Pertinent negatives include normal                            sphincter tone and no palpable rectal lesions.                           A 4 mm polyp was found in the transverse colon. The                            polyp was sessile. The polyp was removed with a                            cold snare. Resection  and retrieval were complete.                            Estimated blood loss was minimal.                           A complex, benign-appearing, intrinsic mild                            stenosis was found in the transverse colon and was                            traversed. There were 2 apparent stenoses in close                            proximity with the distal stenosis estimated at                            about 14 mm in diameter and the proximal stenosis                            18 mm in diameter. Both were traversed without                            resistence. Biopsies were taken with a cold forceps                            for histology. Estimated blood loss was minimal.                           Scarring with tethered mucosa was found in the                            transverse colon, just distal to the stenotic area.                            The scar  tissue was healthy in appearance. Biopsies                            were taken with a cold forceps for histology.                           A few small-mouthed diverticula were found in the                            sigmoid colon.                           The exam was otherwise normal throughout the                            examined colon.  The terminal ileum appeared normal.                           The retroflexed view of the distal rectum and anal                            verge was normal and showed no anal or rectal                            abnormalities. Complications:            No immediate complications. Estimated Blood Loss:     Estimated blood loss was minimal. Impression:               - One 4 mm polyp in the transverse colon, removed                            with a cold snare. Resected and retrieved.                           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 Biopsied.                           - Scar in the transverse colon. Biopsied.                           - Diverticulosis in the sigmoid colon.                           - The examined portion of the ileum was normal.                           - The distal rectum and anal verge are normal on                            retroflexion view.                           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 It is unclear if                            the stricture is causing symptoms or if her                            symptoms are from IBS. Dilation would be                            potentially risky due to the complexity of the  stricture, but could be considered. Recommendation:           - Patient has a contact number available for                            emergencies. The signs and symptoms of potential                             delayed complications were discussed with the                            patient. Return to normal activities tomorrow.                            Written discharge instructions were provided to the                            patient.                           - Resume previous diet.                           - Continue present medications.                           - Await pathology results.                           - Repeat colonoscopy in 7 years for surveillance.                           - Consider trial of low residue or soft diet for 2                            weeks. If abdominal pain and bloating resolve, then                            the colonic stricture may be the cause of the                            majority of her symptoms and dilation may be worth                            the risk. Aleisa Howk E. Candis Schatz, MD 05/27/2021 3:24:57 PM This report has been signed electronically.

## 2021-05-31 ENCOUNTER — Telehealth: Payer: Self-pay

## 2021-05-31 ENCOUNTER — Telehealth: Payer: Self-pay | Admitting: *Deleted

## 2021-05-31 NOTE — Telephone Encounter (Signed)
No answer, left message to call back later today, B.Jacquan Savas RN. 

## 2021-05-31 NOTE — Telephone Encounter (Signed)
Second follow up call attempt.  LVM ?

## 2021-06-02 ENCOUNTER — Encounter: Payer: Self-pay | Admitting: Gastroenterology

## 2021-08-06 DIAGNOSIS — U099 Post covid-19 condition, unspecified: Secondary | ICD-10-CM | POA: Insufficient documentation

## 2021-08-09 ENCOUNTER — Other Ambulatory Visit (HOSPITAL_COMMUNITY): Payer: Self-pay | Admitting: Family

## 2021-08-09 ENCOUNTER — Other Ambulatory Visit: Payer: Self-pay | Admitting: Family

## 2021-08-09 DIAGNOSIS — R0602 Shortness of breath: Secondary | ICD-10-CM

## 2021-08-09 DIAGNOSIS — J129 Viral pneumonia, unspecified: Secondary | ICD-10-CM

## 2021-08-23 ENCOUNTER — Encounter (HOSPITAL_COMMUNITY): Payer: Self-pay

## 2021-08-23 ENCOUNTER — Other Ambulatory Visit: Payer: Self-pay

## 2021-08-23 ENCOUNTER — Ambulatory Visit (HOSPITAL_COMMUNITY)
Admission: RE | Admit: 2021-08-23 | Discharge: 2021-08-23 | Disposition: A | Discharge status: Home / Self Care | Payer: 59 | Source: Ambulatory Visit | Attending: Family | Admitting: Family

## 2021-08-23 DIAGNOSIS — J129 Viral pneumonia, unspecified: Secondary | ICD-10-CM | POA: Diagnosis present

## 2021-08-23 DIAGNOSIS — R0602 Shortness of breath: Secondary | ICD-10-CM | POA: Insufficient documentation

## 2021-08-26 ENCOUNTER — Encounter: Payer: Self-pay | Admitting: *Deleted

## 2021-09-01 ENCOUNTER — Other Ambulatory Visit (HOSPITAL_COMMUNITY): Payer: Self-pay | Admitting: Family

## 2021-09-01 DIAGNOSIS — I2510 Atherosclerotic heart disease of native coronary artery without angina pectoris: Secondary | ICD-10-CM

## 2021-09-02 ENCOUNTER — Other Ambulatory Visit: Payer: Self-pay

## 2021-09-02 ENCOUNTER — Ambulatory Visit (HOSPITAL_COMMUNITY)
Admission: RE | Admit: 2021-09-02 | Discharge: 2021-09-02 | Disposition: A | Discharge status: Home / Self Care | Payer: 59 | Source: Ambulatory Visit | Attending: Family | Admitting: Family

## 2021-09-02 DIAGNOSIS — I340 Nonrheumatic mitral (valve) insufficiency: Secondary | ICD-10-CM | POA: Insufficient documentation

## 2021-09-02 DIAGNOSIS — E119 Type 2 diabetes mellitus without complications: Secondary | ICD-10-CM | POA: Diagnosis not present

## 2021-09-02 DIAGNOSIS — I10 Essential (primary) hypertension: Secondary | ICD-10-CM | POA: Diagnosis not present

## 2021-09-02 DIAGNOSIS — E785 Hyperlipidemia, unspecified: Secondary | ICD-10-CM | POA: Insufficient documentation

## 2021-09-02 DIAGNOSIS — I2510 Atherosclerotic heart disease of native coronary artery without angina pectoris: Secondary | ICD-10-CM | POA: Insufficient documentation

## 2021-09-02 LAB — ECHOCARDIOGRAM COMPLETE
AR max vel: 1.72 cm2
AV Peak grad: 5.4 mmHg
Ao pk vel: 1.16 m/s
Area-P 1/2: 4.57 cm2
Calc EF: 33.7 %
MV M vel: 4.93 m/s
MV Peak grad: 97 mmHg
P 1/2 time: 539 msec
S' Lateral: 4.3 cm
Single Plane A2C EF: 38.2 %
Single Plane A4C EF: 32.5 %

## 2021-09-09 DIAGNOSIS — G629 Polyneuropathy, unspecified: Secondary | ICD-10-CM | POA: Insufficient documentation

## 2021-09-09 DIAGNOSIS — K2100 Gastro-esophageal reflux disease with esophagitis, without bleeding: Secondary | ICD-10-CM | POA: Insufficient documentation

## 2021-09-09 DIAGNOSIS — M3500 Sicca syndrome, unspecified: Secondary | ICD-10-CM | POA: Insufficient documentation

## 2021-09-09 DIAGNOSIS — G729 Myopathy, unspecified: Secondary | ICD-10-CM | POA: Insufficient documentation

## 2021-09-09 DIAGNOSIS — I10 Essential (primary) hypertension: Secondary | ICD-10-CM | POA: Insufficient documentation

## 2021-09-09 DIAGNOSIS — G894 Chronic pain syndrome: Secondary | ICD-10-CM | POA: Insufficient documentation

## 2021-09-09 DIAGNOSIS — M810 Age-related osteoporosis without current pathological fracture: Secondary | ICD-10-CM | POA: Insufficient documentation

## 2021-09-09 DIAGNOSIS — N189 Chronic kidney disease, unspecified: Secondary | ICD-10-CM | POA: Insufficient documentation

## 2021-09-09 DIAGNOSIS — K635 Polyp of colon: Secondary | ICD-10-CM | POA: Insufficient documentation

## 2021-09-09 DIAGNOSIS — F419 Anxiety disorder, unspecified: Secondary | ICD-10-CM | POA: Insufficient documentation

## 2021-09-09 DIAGNOSIS — J45909 Unspecified asthma, uncomplicated: Secondary | ICD-10-CM | POA: Insufficient documentation

## 2021-09-09 DIAGNOSIS — I509 Heart failure, unspecified: Secondary | ICD-10-CM | POA: Insufficient documentation

## 2021-09-09 DIAGNOSIS — J189 Pneumonia, unspecified organism: Secondary | ICD-10-CM | POA: Insufficient documentation

## 2021-09-09 DIAGNOSIS — K219 Gastro-esophageal reflux disease without esophagitis: Secondary | ICD-10-CM | POA: Insufficient documentation

## 2021-09-09 DIAGNOSIS — J309 Allergic rhinitis, unspecified: Secondary | ICD-10-CM | POA: Insufficient documentation

## 2021-09-09 DIAGNOSIS — F329 Major depressive disorder, single episode, unspecified: Secondary | ICD-10-CM | POA: Insufficient documentation

## 2021-09-24 ENCOUNTER — Ambulatory Visit (INDEPENDENT_AMBULATORY_CARE_PROVIDER_SITE_OTHER): Payer: 59 | Admitting: Pulmonary Disease

## 2021-09-24 ENCOUNTER — Other Ambulatory Visit: Payer: Self-pay

## 2021-09-24 ENCOUNTER — Encounter: Payer: Self-pay | Admitting: Pulmonary Disease

## 2021-09-24 VITALS — BP 130/74 | HR 87 | Temp 97.8°F | Ht 59.0 in | Wt 182.4 lb

## 2021-09-24 DIAGNOSIS — J4540 Moderate persistent asthma, uncomplicated: Secondary | ICD-10-CM

## 2021-09-24 DIAGNOSIS — R0609 Other forms of dyspnea: Secondary | ICD-10-CM | POA: Diagnosis not present

## 2021-09-24 MED ORDER — FLUTICASONE FUROATE-VILANTEROL 200-25 MCG/ACT IN AEPB: 1.0000 | INHALATION_SPRAY | Freq: Every day | RESPIRATORY_TRACT | 3 refills | Status: DC

## 2021-09-24 NOTE — Patient Instructions (Signed)
Nice to meet you   Use Breo 1 puff once a day - rinse mouth after every use  Continue albuterol as needed  If the co pay is too high please call us and we will look for a solution  Return to clinic in 3 months or sooner as needed

## 2021-09-27 NOTE — Progress Notes (Signed)
_0  ID: Crystal Marquez, female    DOB: [birth year], 61 y.o.   MRN: [id number]  Chief Complaint  Patient presents with   Consult    Consult. Post covid from October. Issues with heart and lungs per patient report. CT scan done and cardia echo was done as well. Pt does report wheezing     Referring provider: Imagene Riches, NP  HPI:   61 y.o. woman whom we are seeing in evaluation for ongoing dyspnea, wheezing.  Note from referring provider reviewed.  Patient in normal state of health.  Got COVID October 2022.  Since then she is had intermittent issues with shortness of breath, wheezing.  Occasional cough.  Sometimes worse in the evenings.  Worse with exertion although can occur at rest as well.  The feeling of inability get deep breath, mild chest tightness.  Seems to come and go.  Some days are better than others.  No other seasonal environmental factors he can identify to make things better or worse.  Mild relief with prednisone, albuterol.  However after prednisone symptoms returned.  She reports history of recurrent bronchitis usually at least once a year sometimes twice a year with season changes.  Reviewed CT chest 08/23/2021 reveals clear lungs bilaterally on my review interpretation.  Reviewed echocardiogram 09/02/2021 that shows reduction in EF to 40% as well as grade 2 diastolic dysfunction and dilated left atrium, moderate MVR, normal RA size and normal RV function and size.   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  MMRC: No flowsheet data found.  Epworth:  No flowsheet data  found.  Tests:   FENO:  No results found for: NITRICOXIDE  PFT: No flowsheet data found.  WALK:  No flowsheet data found.  Imaging: Personally reviewed and as per EMR discussion this note ECHOCARDIOGRAM COMPLETE  Result Date: 09/02/2021    ECHOCARDIOGRAM REPORT   Patient Name:   Crystal Marquez Date of Exam: 09/02/2021 Medical Rec #:  [id number]   Height:       59.0 in Accession #:    [id number]  Weight:       189.0 lb Date of Birth:  17-Feb-1961    BSA:          1.801 m Patient Age:    61 years    BP:           120/62 mmHg Patient Gender: F           HR:           95 bpm. Exam Location:  Outpatient Procedure: 2D Echo, Cardiac Doppler, Color Doppler and Strain Analysis Indications:    Atherosclerosis  History:        Patient has no prior history of Echocardiogram examinations.                 Risk Factors:Diabetes, Dyslipidemia and Hypertension.  Sonographer:    Jyl Heinz Referring Phys: [place]  1. Left ventricular ejection fraction, by estimation, is 40 to 45%. Left ventricular ejection fraction by 3D volume is 41 %. The left ventricle has mildly decreased function. The left ventricle demonstrates global hypokinesis. Left ventricular diastolic  parameters are consistent with Grade II diastolic dysfunction (pseudonormalization). The average left ventricular global longitudinal strain is -11.9 %. The global longitudinal strain is abnormal.  2. Right ventricular systolic function is normal. The right ventricular size is normal.  3. Left atrial size was mild to moderately dilated.  4. The mitral valve is grossly normal. Mild to moderate mitral valve regurgitation.  5. The aortic valve is grossly normal. Aortic valve regurgitation is trivial. No aortic stenosis is present. FINDINGS  Left Ventricle: Left ventricular ejection fraction, by estimation, is 40 to 45%. Left ventricular ejection fraction by 3D volume is 41 %. The left ventricle has mildly decreased function. The left  ventricle demonstrates global hypokinesis. The average left ventricular global longitudinal strain is -11.9 %. The global longitudinal strain is abnormal. The left ventricular internal cavity size was normal in size. There is no left ventricular hypertrophy. Left ventricular diastolic parameters are consistent with Grade II diastolic dysfunction (pseudonormalization). Right Ventricle: The right ventricular size is normal. Right vetricular wall thickness was not well visualized. Right ventricular systolic function is normal. Left Atrium: Left atrial size was mild to moderately dilated. Right Atrium: Right atrial size was normal in size. Pericardium: There is no evidence of pericardial effusion. Mitral Valve: The mitral valve is grossly normal. Mild to moderate mitral valve regurgitation. Tricuspid Valve: The tricuspid valve is normal in structure. Tricuspid valve regurgitation is not demonstrated. Aortic Valve: The aortic valve is grossly normal. There is mild aortic valve annular calcification. Aortic valve regurgitation is trivial. Aortic regurgitation PHT measures 539 msec. No aortic stenosis is present. Aortic valve peak gradient measures 5.4 mmHg. Pulmonic Valve: The pulmonic valve was normal in structure. Pulmonic valve regurgitation is not visualized. Aorta: The aortic root and ascending aorta are structurally normal, with no evidence of dilitation. IAS/Shunts: The atrial septum is grossly normal.  LEFT VENTRICLE PLAX 2D LVIDd:         5.90 cm         Diastology LVIDs:         4.30 cm         LV e' medial:    5.17 cm/s LV PW:         1.00 cm         LV E/e' medial:  23.0 LV IVS:        1.00 cm         LV e' lateral:   5.00 cm/s LVOT diam:     1.80 cm         LV E/e' lateral: 23.8 LV SV:         39 LV SV Index:   21              2D LVOT Area:     2.54 cm        Longitudinal                                Strain                                2D Strain GLS  -11.9 % LV Volumes (MOD)               Avg: LV vol d,  MOD    156.0 ml A2C:  3D Volume EF LV vol d, MOD    157.0 ml      LV 3D EF:    Left A4C:                                        ventricul LV vol s, MOD    96.4 ml                    ar A2C:                                        ejection LV vol s, MOD    106.0 ml                   fraction A4C:                                        by 3D LV SV MOD A2C:   59.6 ml                    volume is LV SV MOD A4C:   157.0 ml                   41 %. LV SV MOD BP:    52.9 ml                                 3D Volume EF:                                3D EF:        41 %                                LV EDV:       216 ml                                LV ESV:       127 ml                                LV SV:        89 ml RIGHT VENTRICLE RV Basal diam:  3.10 cm RV Mid diam:    2.00 cm RV S prime:     8.83 cm/s TAPSE (M-mode): 2.4 cm LEFT ATRIUM             Index        RIGHT ATRIUM           Index LA diam:        3.90 cm 2.17 cm/m   RA Area:     12.30 cm LA Vol (A2C):   63.6 ml 35.32 ml/m  RA Volume:   26.30 ml  14.61 ml/m LA Vol (A4C):   84.7 ml 47.04 ml/m LA Biplane Vol: 74.2 ml 41.21 ml/m  AORTIC VALVE AV Area (Vmax):  1.72 cm AV Vmax:        116.00 cm/s AV Peak Grad:   5.4 mmHg LVOT Vmax:      78.60 cm/s LVOT Vmean:     58.800 cm/s LVOT VTI:       0.152 m AI PHT:         539 msec  AORTA Ao Root diam: 2.60 cm Ao Asc diam:  2.70 cm MITRAL VALVE MV Area (PHT): 4.57 cm     SHUNTS MV Decel Time: 166 msec     Systemic VTI:  0.15 m MR Peak grad: 97.0 mmHg     Systemic Diam: 1.80 cm MR Mean grad: 68.0 mmHg MR Vmax:      492.50 cm/s MR Vmean:     400.5 cm/s MV E velocity: 119.00 cm/s MV A velocity: 104.00 cm/s MV E/A ratio:  1.14 Mertie Moores MD Electronically signed by Mertie Moores MD Signature Date/Time: 09/02/2021/4:19:23 PM    Final     Lab Results: Personally reviewed via [place] with mild elevation in eosinophils 200 and 02/2020 CBC No results found for: WBC, RBC, HGB,  HCT, PLT, MCV, MCH, MCHC, RDW, LYMPHSABS, MONOABS, EOSABS, BASOSABS  BMET No results found for: NA, K, CL, CO2, GLUCOSE, BUN, CREATININE, CALCIUM, GFRNONAA, GFRAA  BNP No results found for: BNP  ProBNP No results found for: PROBNP  Specialty Problems       Pulmonary Problems   Mild intermittent asthma without complication   Allergic rhinitis   Asthma    Allergies  Allergen Reactions   Lisinopril Shortness Of Breath    Bloating     Immunization History  Administered Date(s) Administered   Influenza,inj,quad, With Preservative 06/19/2017   Influenza-Unspecified 06/28/2021   PFIZER(Purple Top)SARS-COV-2 Vaccination 12/19/2019, 01/13/2020    Past Medical History:  Diagnosis Date   Age-related osteoporosis without current pathological fracture    Allergic rhinitis    Anxiety    Asthma    B12 deficiency 05/06/2020   Benign essential HTN 08/29/2019   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   Chronic kidney disease    Chronic pain syndrome    Class 2 obesity due to excess calories without serious comorbidity with body mass index (BMI) of 36.0 to 36.9 in adult 08/29/2019   Colitis 05/06/2020   Colon polyp    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[place]) 05/06/2020   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   Dyslipidemia 08/29/2019   GERD (gastroesophageal reflux disease)    Hypercalcemia    Hypertension    Iron deficiency anemia due to chronic blood loss 05/06/2020   Major depressive disorder, single episode    Mild intermittent asthma without complication 37/06/6268   Myopathy    Neuropathy    Pain in right hand 08/09/2018   Reflux esophagitis    Sjogren's disease ([place])    Spinal stenosis of lumbar region 12/13/2017   Tenosynovitis of left hand 08/09/2018   Type 2 diabetes mellitus without complication, without long-term current use of insulin ([place]) 08/29/2019    Tobacco History: Social History   Tobacco Use  Smoking Status Never  Smokeless Tobacco Never    Counseling given: Not Answered   Continue to not smoke  Outpatient Encounter Medications as of 09/24/2021  Medication Sig   albuterol (PROVENTIL) (2.5 MG/3ML) 0.083% nebulizer solution Take 2.5 mg by nebulization every 6 (six) hours as needed for wheezing or shortness of breath.   albuterol (VENTOLIN HFA) 108 (90 Base) MCG/ACT inhaler Inhale 1 puff into the  lungs every 6 (six) hours as needed for wheezing or shortness of breath.   cetirizine (ZYRTEC) 10 MG tablet Take 10 mg by mouth at bedtime.   Cholecalciferol (VITAMIN D3) 125 MCG (5000 UT) CAPS Take 1 capsule by mouth daily.   Cyanocobalamin (B-12 COMPLIANCE INJECTION) 1000 MCG/ML KIT Inject 1,000 mcg as directed every 30 (thirty) days.   cycloSPORINE (RESTASIS) 0.05 % ophthalmic emulsion Place 1 drop into both eyes 2 (two) times daily.   DULoxetine (CYMBALTA) 60 MG capsule Take 120 mg by mouth daily.   ferrous sulfate 325 (65 FE) MG tablet Take 325 mg by mouth daily with breakfast.   fluticasone furoate-vilanterol (BREO ELLIPTA) 200-25 MCG/ACT AEPB Inhale 1 puff into the lungs daily.   Folic Acid 0.8 MG CAPS Take 2 capsules by mouth daily.   hydrocortisone (ANUSOL-HC) 2.5 % rectal cream Place 1 application rectally daily.   hydroxychloroquine (PLAQUENIL) 200 MG tablet Take 400 mg by mouth daily.   ipratropium-albuterol (DUONEB) 0.5-2.5 (3) MG/3ML SOLN Take 3 mLs by nebulization daily as needed (shortness of breath).   Magnesium 400 MG TABS Take 400 mg by mouth 2 (two) times daily.   metFORMIN (GLUCOPHAGE) 500 MG tablet Take by mouth 2 (two) times daily with a meal.   montelukast (SINGULAIR) 10 MG tablet Take 10 mg by mouth at bedtime.   mupirocin ointment (BACTROBAN) 2 % Apply 1 application topically 2 (two) times daily.   NYSTATIN powder Apply 1 application topically 3 (three) times daily.   olmesartan-hydrochlorothiazide (BENICAR HCT) 40-25 MG tablet Take 1 tablet by mouth daily.   pantoprazole (PROTONIX) 40 MG tablet Take 40 mg by  mouth daily.   polyethylene glycol (MIRALAX / GLYCOLAX) 17 g packet Take 17 g by mouth every other day.   potassium chloride SA (K-DUR,KLOR-CON) 20 MEQ tablet Take 20 mEq by mouth daily.   promethazine (PHENERGAN) 12.5 MG tablet Take 12.5 mg by mouth every 6 (six) hours as needed for nausea or vomiting.   rosuvastatin (CRESTOR) 5 MG tablet Take 5 mg by mouth daily.   senna (SENOKOT) 8.6 MG TABS tablet Take 1 tablet (8.6 mg total) by mouth every other day.   triamcinolone (KENALOG) 0.1 % paste Use as directed 1 application in the mouth or throat 3 (three) times daily.   [DISCONTINUED] buPROPion (WELLBUTRIN SR) 150 MG 12 hr tablet Take 150 mg by mouth daily. (Patient not taking: Reported on 09/24/2021)   Facility-Administered Encounter Medications as of 09/24/2021  Medication   triamcinolone acetonide (KENALOG) 10 MG/ML injection 10 mg   triamcinolone acetonide (KENALOG) 10 MG/ML injection 10 mg     Review of Systems  Review of Systems  No chest pain with exertion.  No orthopnea or PND.  No lower extremity swelling.  Conference review of systems otherwise negative. Physical Exam  BP 130/74 (BP Location: Left Arm, Patient Position: Sitting, Cuff Size: Normal)    Pulse 87    Temp 97.8 F (36.6 C) (Oral)    Ht _0  (1.499 m)    Wt 182 lb 6.4 oz (82.7 kg)    SpO2 97%    BMI 36.84 kg/m   Wt Readings from Last 5 Encounters:  09/24/21 182 lb 6.4 oz (82.7 kg)  05/27/21 189 lb (85.7 kg)  04/16/21 189 lb (85.7 kg)    BMI Readings from Last 5 Encounters:  09/24/21 36.84 kg/m  05/27/21 38.17 kg/m  04/16/21 38.17 kg/m     Physical Exam General: Well-appearing, sitting in chair Eyes: EOMI, icterus  Neck: Supple, no JVP Pulmonary: Clear, no wheeze Cardiovascular: Regular in rhythm, no murmur Abdomen: Nondistended, bowel sounds present MSK: No synovitis, no joint effusion Neuro: Normal gait, no weakness Psych: Normal mood, full affect   Assessment & Plan:   Dyspnea on exertion:  Likely multifactorial.  Contribution of cardiac causes given her reduced EF to 40% as well as left atrial dilation and MVR.  Fortunately right side looks reassuring in terms of pulmonary hypertension.  Initially, high suspicion for poorly controlled asthma especially with exacerbation or worsening since COVID infection 06/2021.  Consider PFTs in the future if not responding to asthma therapies as below.  Asthma: Preceded diagnosis.  Suspicious for uncontrolled asthma given worsening in symptoms after COVID infection, post viral.  Start high-dose Breo for maintenance ICS/LABA therapy.  Continue albuterol as needed.  Consider phenotyping in the future.   Return in about 3 months (around 12/23/2021).   Lanier Clam, MD 09/27/2021

## 2021-09-28 DIAGNOSIS — M25561 Pain in right knee: Secondary | ICD-10-CM | POA: Insufficient documentation

## 2021-09-30 ENCOUNTER — Other Ambulatory Visit: Payer: Self-pay

## 2021-09-30 DIAGNOSIS — M3500 Sicca syndrome, unspecified: Secondary | ICD-10-CM

## 2021-09-30 DIAGNOSIS — M2550 Pain in unspecified joint: Secondary | ICD-10-CM | POA: Insufficient documentation

## 2021-09-30 DIAGNOSIS — M797 Fibromyalgia: Secondary | ICD-10-CM

## 2021-09-30 HISTORY — DX: Sjogren syndrome, unspecified: M35.00

## 2021-09-30 HISTORY — DX: Fibromyalgia: M79.7

## 2021-09-30 HISTORY — DX: Pain in unspecified joint: M25.50

## 2021-10-06 ENCOUNTER — Telehealth (HOSPITAL_COMMUNITY): Payer: Self-pay | Admitting: *Deleted

## 2021-10-06 ENCOUNTER — Ambulatory Visit (INDEPENDENT_AMBULATORY_CARE_PROVIDER_SITE_OTHER): Payer: 59 | Admitting: Cardiology

## 2021-10-06 ENCOUNTER — Encounter: Payer: Self-pay | Admitting: Cardiology

## 2021-10-06 ENCOUNTER — Other Ambulatory Visit: Payer: Self-pay

## 2021-10-06 VITALS — BP 122/68 | HR 97 | Ht 59.0 in | Wt 183.2 lb

## 2021-10-06 DIAGNOSIS — R0609 Other forms of dyspnea: Secondary | ICD-10-CM | POA: Insufficient documentation

## 2021-10-06 DIAGNOSIS — E785 Hyperlipidemia, unspecified: Secondary | ICD-10-CM | POA: Diagnosis not present

## 2021-10-06 DIAGNOSIS — I2510 Atherosclerotic heart disease of native coronary artery without angina pectoris: Secondary | ICD-10-CM

## 2021-10-06 DIAGNOSIS — E119 Type 2 diabetes mellitus without complications: Secondary | ICD-10-CM

## 2021-10-06 DIAGNOSIS — Z6836 Body mass index (BMI) 36.0-36.9, adult: Secondary | ICD-10-CM

## 2021-10-06 DIAGNOSIS — I10 Essential (primary) hypertension: Secondary | ICD-10-CM

## 2021-10-06 DIAGNOSIS — E6609 Other obesity due to excess calories: Secondary | ICD-10-CM | POA: Diagnosis not present

## 2021-10-06 HISTORY — DX: Other forms of dyspnea: R06.09

## 2021-10-06 HISTORY — DX: Atherosclerotic heart disease of native coronary artery without angina pectoris: I25.10

## 2021-10-06 NOTE — Telephone Encounter (Signed)
Left message on voicemail per DPR in reference to upcoming appointment scheduled on 08/24/2022 at 8:00 with detailed instructions given per Myocardial Perfusion Study Information Sheet for the test. LM to arrive 15 minutes early, and that it is imperative to arrive on time for appointment to keep from having the test rescheduled. If you need to cancel or reschedule your appointment, please call the office within 24 hours of your appointment. Failure to do so may result in a cancellation of your appointment, and a $50 no show fee. Phone number given for call back for any questions.   

## 2021-10-06 NOTE — Progress Notes (Signed)
Cardiology Office Note:    Date:  10/06/2021 and 49.4 down  ID:  Crystal Marquez, DOB December 19, 1960, MRN [id number]  PCP:  Imagene Riches, NP  Cardiologist:  Jenean Lindau, MD   Referring MD: Imagene Riches, NP    ASSESSMENT:    1. Benign essential HTN   2. Type 2 diabetes mellitus without complication, without long-term current use of insulin ([place])   3. Dyslipidemia   4. Class 2 obesity due to excess calories without serious comorbidity with body mass index (BMI) of 36.0 to 36.9 in adult   5. Atherosclerosis of native coronary artery of native heart without angina pectoris   6. Dyspnea on exertion    PLAN:    In order of problems listed above:  Coronary atherosclerosis detected by CT scan and dyspnea on exertion: I discussed my findings with the patient in extensive length.  Secondary prevention stressed.  Importance of compliance with diet medication stressed and he vocalized understanding.  In view of her symptoms we will do a Lexiscan sestamibi and she is agreeable. Essential hypertension: Blood pressure stable and diet was emphasized.  Lifestyle modification urged. Mixed dyslipidemia: On statin therapy and followed by primary care. Cardiac murmur: Echocardiogram will be done to assess murmur heard on auscultation. Mildly depressed ejection fraction: This is mild in nature.  She is on Jardiance.  She is not keen on changing any medications at this time.  I will review this in 6 months and consider Entresto if she is interested. Obesity: Weight reduction was stressed and diet was emphasized.  She is a diabetic and she is going to do this in a very meticulous manner. Patient will be seen in follow-up appointment in 6 months or earlier if the patient has any concerns    Medication Adjustments/Labs and Tests Ordered: Current medicines are reviewed at length with the patient today.  Concerns regarding medicines are outlined above.  No orders of the defined types were placed in this  encounter.  No orders of the defined types were placed in this encounter.    History of Present Illness:    Crystal Marquez is a 61 y.o. female who is being seen today for the evaluation of coronary artery calcification and dyspnea on exertion at the request of [place], Regina F, NP.  Patient is a pleasant 61 year old female.  She has past medical history of coronary atherosclerosis detected by CT scan of the chest.  She denies any problems at this time except dyspnea on exertion.  Overall she leads a sedentary lifestyle.  She has history of essential hypertension, dyslipidemia, diabetes mellitus, obesity and renal insufficiency.  At the time of my evaluation, the patient is alert awake oriented and in no distress.  Past Medical History:  Diagnosis Date   Age-related osteoporosis without current pathological fracture    Allergic rhinitis    Anxiety    Asthma    B12 deficiency 05/06/2020   Benign essential HTN 08/29/2019   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   Chronic kidney disease    Chronic pain syndrome    Class 2 obesity due to excess calories without serious comorbidity with body mass index (BMI) of 36.0 to 36.9 in adult 08/29/2019   Colitis 05/06/2020   Colon polyp    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[place]) 05/06/2020   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   Dyslipidemia 08/29/2019   Fibromyalgia 09/30/2021   GERD (gastroesophageal reflux disease)    Hypercalcemia    Hypertension  Iron deficiency anemia due to chronic blood loss 05/06/2020   Major depressive disorder, single episode    Mild intermittent asthma without complication 88/89/1694   Myopathy    Neuropathy    Pain in right hand 08/09/2018   Polyarthralgia 09/30/2021   Reflux esophagitis    Sjogren syndrome, unspecified ([place]) 09/30/2021   Sjogren's disease ([place])    Spinal stenosis of lumbar region 12/13/2017   Tenosynovitis of left hand 08/09/2018   Type 2 diabetes mellitus without complication, without  long-term current use of insulin ([place]) 08/29/2019    Past Surgical History:  Procedure Laterality Date   ABDOMINAL HYSTERECTOMY     CARPAL TUNNEL RELEASE Left 2018   CERVICAL FUSION  10/07/2019   Anterior Cervical Discectomy due to spinal stenosis   CESAREAN SECTION     COLONOSCOPY     UPPER GASTROINTESTINAL ENDOSCOPY      Current Medications: Current Meds  Medication Sig   albuterol (PROVENTIL) (2.5 MG/3ML) 0.083% nebulizer solution Take 2.5 mg by nebulization every 6 (six) hours as needed for wheezing or shortness of breath.   albuterol (VENTOLIN HFA) 108 (90 Base) MCG/ACT inhaler Inhale 1 puff into the lungs every 6 (six) hours as needed for wheezing or shortness of breath.   cetirizine (ZYRTEC) 10 MG tablet Take 10 mg by mouth at bedtime.   Cholecalciferol (VITAMIN D3) 125 MCG (5000 UT) CAPS Take 1 capsule by mouth daily.   Cyanocobalamin (B-12 COMPLIANCE INJECTION) 1000 MCG/ML KIT Inject 1,000 mcg as directed every 30 (thirty) days.   cycloSPORINE (RESTASIS) 0.05 % ophthalmic emulsion Place 1 drop into both eyes 2 (two) times daily.   DULoxetine (CYMBALTA) 60 MG capsule Take 120 mg by mouth daily.   ferrous sulfate 325 (65 FE) MG tablet Take 325 mg by mouth daily with breakfast.   fluticasone furoate-vilanterol (BREO ELLIPTA) 200-25 MCG/ACT AEPB Inhale 1 puff into the lungs daily.   Folic Acid 0.8 MG CAPS Take 2 capsules by mouth daily.   hydrocortisone (ANUSOL-HC) 2.5 % rectal cream Place 1 application rectally daily.   hydroxychloroquine (PLAQUENIL) 200 MG tablet Take 400 mg by mouth daily.   ipratropium-albuterol (DUONEB) 0.5-2.5 (3) MG/3ML SOLN Take 3 mLs by nebulization daily as needed (shortness of breath).   JARDIANCE 25 MG TABS tablet Take 25 mg by mouth daily.   Magnesium 400 MG TABS Take 400 mg by mouth 2 (two) times daily.   metFORMIN (GLUCOPHAGE) 500 MG tablet Take 500 mg by mouth 2 (two) times daily with a meal.   montelukast (SINGULAIR) 10 MG tablet Take 10 mg by  mouth at bedtime.   mupirocin ointment (BACTROBAN) 2 % Apply 1 application topically 2 (two) times daily.   NYSTATIN powder Apply 1 application topically 3 (three) times daily.   olmesartan-hydrochlorothiazide (BENICAR HCT) 40-25 MG tablet Take 1 tablet by mouth daily.   pantoprazole (PROTONIX) 40 MG tablet Take 40 mg by mouth daily.   polyethylene glycol (MIRALAX / GLYCOLAX) 17 g packet Take 17 g by mouth every other day.   potassium chloride SA (K-DUR,KLOR-CON) 20 MEQ tablet Take 20 mEq by mouth daily.   promethazine (PHENERGAN) 12.5 MG tablet Take 12.5 mg by mouth every 6 (six) hours as needed for nausea or vomiting.   rosuvastatin (CRESTOR) 5 MG tablet Take 5 mg by mouth daily.   senna (SENOKOT) 8.6 MG TABS tablet Take 1 tablet (8.6 mg total) by mouth every other day.     Allergies:   Lisinopril   Social History  Socioeconomic History   Marital status: Married    Spouse name: Not on file   Number of children: Not on file   Years of education: Not on file   Highest education level: Not on file  Occupational History   Not on file  Tobacco Use   Smoking status: Never   Smokeless tobacco: Never  Vaping Use   Vaping Use: Never used  Substance and Sexual Activity   Alcohol use: No    Alcohol/week: 0.0 standard drinks   Drug use: No   Sexual activity: Not on file  Other Topics Concern   Not on file  Social History Narrative   Not on file   Social Determinants of Health   Financial Resource Strain: Not on file  Food Insecurity: Not on file  Transportation Needs: Not on file  Physical Activity: Not on file  Stress: Not on file  Social Connections: Not on file     Family History: The patient's family history includes Asthma in her mother; COPD in her mother; Colon polyps in her father and sister; Crohn's disease in her sister; Depression in her father; Diabetes in her father and mother; Diverticulitis in her sister; Heart disease in her father, maternal grandmother, and  paternal grandmother; Hypercholesterolemia in her sister; Hypertension in her mother; Kidney disease in her brother and mother; Skin cancer in her father. There is no history of Colon cancer, Rectal cancer, or Stomach cancer.  ROS:   Please see the history of present illness.    All other systems reviewed and are negative.  EKGs/Labs/Other Studies Reviewed:    The following studies were reviewed today: EKG-sinus rhythm and nonspecific ST-T changes   Recent Labs: No results found for requested labs within last 8760 hours.  Recent Lipid Panel No results found for: CHOL, TRIG, HDL, CHOLHDL, VLDL, LDLCALC, LDLDIRECT  Physical Exam:    VS:  BP 122/68    Pulse 97    Ht _0  (1.499 m)    Wt 183 lb 3.2 oz (83.1 kg)    SpO2 98%    BMI 37.00 kg/m     Wt Readings from Last 3 Encounters:  10/06/21 183 lb 3.2 oz (83.1 kg)  09/24/21 182 lb 6.4 oz (82.7 kg)  05/27/21 189 lb (85.7 kg)     GEN: Patient is in no acute distress HEENT: Normal NECK: No JVD; No carotid bruits LYMPHATICS: No lymphadenopathy CARDIAC: S1 S2 regular, 2/6 systolic murmur at the apex. RESPIRATORY:  Clear to auscultation without rales, wheezing or rhonchi  ABDOMEN: Soft, non-tender, non-distended MUSCULOSKELETAL:  No edema; No deformity  SKIN: Warm and dry NEUROLOGIC:  Alert and oriented x 3 PSYCHIATRIC:  Normal affect    Signed, Jenean Lindau, MD  10/06/2021 10:37 AM    [place]

## 2021-10-06 NOTE — Patient Instructions (Addendum)
Medication Instructions:  Your physician recommends that you continue on your current medications as directed. Please refer to the Current Medication list given to you today.  *If you need a refill on your cardiac medications before your next appointment, please call your pharmacy*   Lab Work: None ordered If you have labs (blood work) drawn today and your tests are completely normal, you will receive your results only by: MyChart Message (if you have MyChart) OR A paper copy in the mail If you have any lab test that is abnormal or we need to change your treatment, we will call you to review the results.   Testing/Procedures: Your physician has requested that you have a lexiscan myoview. For further information please visit [email or web address]. Please follow instruction sheet, as given.  The test will take approximately 3 to 4 hours to complete; you may bring reading material.  If someone comes with you to your appointment, they will need to remain in the main lobby due to limited space in the testing area.   How to prepare for your Myocardial Perfusion Test: Do not eat or drink 3 hours prior to your test, except you may have water. Do not consume products containing caffeine (regular or decaffeinated) 12 hours prior to your test. (ex: coffee, chocolate, sodas, tea). Do bring a list of your current medications with you.  If not listed below, you may take your medications as normal. Do wear comfortable clothes (no dresses or overalls) and walking shoes, tennis shoes preferred (No heels or open toe shoes are allowed). Do NOT wear cologne, perfume, aftershave, or lotions (deodorant is allowed). If these instructions are not followed, your test will have to be rescheduled.    Follow-Up: At [facility], you and your health needs are our priority.  As part of our continuing mission to provide you with exceptional heart care, we have created designated Provider Care Teams.  These Care Teams  include your primary Cardiologist (physician) and Advanced Practice Providers (APPs -  Physician Assistants and Nurse Practitioners) who all work together to provide you with the care you need, when you need it.  We recommend signing up for the patient portal called "MyChart".  Sign up information is provided on this After Visit Summary.  MyChart is used to connect with patients for Virtual Visits (Telemedicine).  Patients are able to view lab/test results, encounter notes, upcoming appointments, etc.  Non-urgent messages can be sent to your provider as well.   To learn more about what you can do with MyChart, go to [email or web address].    Your next appointment:   6 month(s)  The format for your next appointment:   In Person  Provider:   Rajan Revankar, MD   Other Instructions Cardiac Nuclear Scan A cardiac nuclear scan is a test that is done to check the flow of blood to your heart. It is done when you are resting and when you are exercising. The test looks for problems such as: Not enough blood reaching a portion of the heart. The heart muscle not working as it should. You may need this test if: You have heart disease. You have had lab results that are not normal. You have had heart surgery or a balloon procedure to open up blocked arteries (angioplasty). You have chest pain. You have shortness of breath. In this test, a special dye (tracer) is put into your bloodstream. The tracer will travel to your heart. A camera will then take pictures of your heart to see   how the tracer moves through your heart. This test is usually done at a hospital and takes 2-4 hours. Tell a doctor about: Any allergies you have. All medicines you are taking, including vitamins, herbs, eye drops, creams, and over-the-counter medicines. Any problems you or family members have had with anesthetic medicines. Any blood disorders you have. Any surgeries you have had. Any medical conditions you  have. Whether you are pregnant or may be pregnant. What are the risks? Generally, this is a safe test. However, problems may occur, such as: Serious chest pain and heart attack. This is only a risk if the stress portion of the test is done. Rapid heartbeat. A feeling of warmth in your chest. This feeling usually does not last long. Allergic reaction to the tracer. What happens before the test? Ask your doctor about changing or stopping your normal medicines. This is important. Follow instructions from your doctor about what you cannot eat or drink. Remove your jewelry on the day of the test. What happens during the test? An IV tube will be inserted into one of your veins. Your doctor will give you a small amount of tracer through the IV tube. You will wait for 20-40 minutes while the tracer moves through your bloodstream. Your heart will be monitored with an electrocardiogram (ECG). You will lie down on an exam table. Pictures of your heart will be taken for about 15-20 minutes. You may also have a stress test. For this test, one of these things may be done: You will be asked to exercise on a treadmill or a stationary bike. You will be given medicines that will make your heart work harder. This is done if you are unable to exercise. When blood flow to your heart has peaked, a tracer will again be given through the IV tube. After 20-40 minutes, you will get back on the exam table. More pictures will be taken of your heart. Depending on the tracer that is used, more pictures may need to be taken 3-4 hours later. Your IV tube will be removed when the test is over. The test may vary among doctors and hospitals. What happens after the test? Ask your doctor: Whether you can return to your normal schedule, including diet, activities, and medicines. Whether you should drink more fluids. This will help to remove the tracer from your body. Drink enough fluid to keep your pee (urine) pale  yellow. Ask your doctor, or the department that is doing the test: When will my results be ready? How will I get my results? Summary A cardiac nuclear scan is a test that is done to check the flow of blood to your heart. Tell your doctor whether you are pregnant or may be pregnant. Before the test, ask your doctor about changing or stopping your normal medicines. This is important. Ask your doctor whether you can return to your normal activities. You may be asked to drink more fluids. This information is not intended to replace advice given to you by your health care provider. Make sure you discuss any questions you have with your health care provider. Document Revised: 12/26/2018 Document Reviewed: 02/19/2018 Elsevier Patient Education  2021 [employer]vier Inc.    

## 2021-10-12 ENCOUNTER — Other Ambulatory Visit (HOSPITAL_COMMUNITY): Payer: Self-pay | Admitting: Family

## 2021-10-12 ENCOUNTER — Ambulatory Visit (HOSPITAL_COMMUNITY)
Admission: RE | Admit: 2021-10-12 | Discharge: 2021-10-12 | Disposition: A | Discharge status: Home / Self Care | Payer: 59 | Source: Ambulatory Visit | Attending: Family | Admitting: Family

## 2021-10-12 ENCOUNTER — Other Ambulatory Visit: Payer: Self-pay

## 2021-10-12 DIAGNOSIS — J189 Pneumonia, unspecified organism: Secondary | ICD-10-CM | POA: Insufficient documentation

## 2021-10-19 ENCOUNTER — Encounter: Payer: Self-pay | Admitting: *Deleted

## 2021-10-19 ENCOUNTER — Telehealth: Payer: Self-pay | Admitting: *Deleted

## 2021-10-19 NOTE — Telephone Encounter (Signed)
Left message on voicemail per DPR in reference to upcoming appointment scheduled on 10/26/21 at 1115 with detailed instructions given per Myocardial Perfusion Study Information Sheet for the test. LM to arrive 15 minutes early, and that it is imperative to arrive on time for appointment to keep from having the test rescheduled. If you need to cancel or reschedule your appointment, please call the office within 24 hours of your appointment. Failure to do so may result in a cancellation of your appointment, and a $50 no show fee. Phone number given for call back for any questions. Mychart letter sent. Taijah Macrae, Ranae Palms

## 2021-10-20 ENCOUNTER — Other Ambulatory Visit: Payer: Self-pay

## 2021-10-20 ENCOUNTER — Ambulatory Visit (INDEPENDENT_AMBULATORY_CARE_PROVIDER_SITE_OTHER): Payer: 59 | Admitting: Orthopaedic Surgery

## 2021-10-20 ENCOUNTER — Encounter: Payer: Self-pay | Admitting: Orthopaedic Surgery

## 2021-10-20 ENCOUNTER — Ambulatory Visit (INDEPENDENT_AMBULATORY_CARE_PROVIDER_SITE_OTHER): Payer: 59

## 2021-10-20 VITALS — Ht 59.0 in | Wt 183.4 lb

## 2021-10-20 DIAGNOSIS — M25561 Pain in right knee: Secondary | ICD-10-CM

## 2021-10-20 MED ORDER — METHYLPREDNISOLONE ACETATE 40 MG/ML IJ SUSP
40.0000 mg | INTRAMUSCULAR | Status: AC | PRN
  Administered 2021-10-20: 40 mg via INTRA_ARTICULAR

## 2021-10-20 MED ORDER — LIDOCAINE HCL 1 % IJ SOLN
3.0000 mL | INTRAMUSCULAR | Status: AC | PRN
  Administered 2021-10-20: 3 mL

## 2021-10-20 NOTE — Progress Notes (Signed)
Office Visit Note   Patient: Crystal Marquez           Date of Birth: [birth year]           MRN: [id number] Visit Date: 10/20/2021              Requested by: Barnetta Chapel, NP [address] [place],  [place] [address] PCP: Barnetta Chapel, NP   Assessment & Plan: Visit Diagnoses:  1. Right knee pain, unspecified chronicity   2. Acute pain of right knee     Plan: I did recommend her continuing occasional Tylenol and a knee brace and also recommended a steroid injection in her right leg which she agreed to and tolerated well.  She understands this can adversely affect her blood glucose.  I would like to see her back in 2 weeks for repeat exam.  If she is still having significant symptoms, a MRI of her right knee would be warranted at that point to assess for meniscal tear given the acute aspect of her right knee pain with locking and catching.  All questions and concerns were answered and addressed.  Follow-Up Instructions: Return in about 2 weeks (around 11/03/2021).   Orders:  Orders Placed This Encounter  Procedures   Large Joint Inj   XR Knee 1-2 Views Right   No orders of the defined types were placed in this encounter.     Procedures: Large Joint Inj: L knee on 10/20/2021 9:08 AM Indications: diagnostic evaluation and pain Details: 22 G 1.5 in needle, superolateral approach  Arthrogram: No  Medications: 3 mL lidocaine 1 %; 40 mg methylPREDNISolone acetate 40 MG/ML Outcome: tolerated well, no immediate complications Procedure, treatment alternatives, risks and benefits explained, specific risks discussed. Consent was given by the patient. Immediately prior to procedure a time out was called to verify the correct patient, procedure, equipment, support staff and site/side marked as required. Patient was prepped and draped in the usual sterile fashion.      Clinical Data: No additional findings.   Subjective: Chief Complaint  Patient presents with   Right Knee - New  Patient (Initial Visit)  The patient comes in today as a new patient with acute right knee pain.  She actually injured her knee after twisting episode in December.  It does hurt to weight-bear with her right knee.  She is taken Tylenol for pain and worn a knee brace as needed.  She did complete a round of steroids which did help some with her pain.  She is never injured this knee before and never had surgery on her right knee.  She is a diabetic but reports good control.  She has no issues with her left knee.  HPI  Review of Systems She denies any headache, chest pain, shortness of breath, fever, chills, nausea, vomiting  Objective: Vital Signs: Ht 4\' 11"  (1.499 m)    Wt 183 lb 6.4 oz (83.2 kg)    LMP  (LMP Unknown)    BMI 37.04 kg/m   Physical Exam She is alert and orient x3 and in no acute distress.  She points globally around her right knee as a source of her pain. Ortho Exam Examination of her right knee shows no effusion today and no instability on exam.  She has good range of motion of the knee but it is painful.  The knee does have some varus malalignment and significant medial joint line tenderness.  It feels ligamentously stable. Specialty Comments:  No  specialty comments available.  Imaging: XR Knee 1-2 Views Right  Result Date: 10/20/2021 An AP and lateral right knee does show significant medial joint space narrowing.    PMFS History: Patient Active Problem List   Diagnosis Date Noted   Coronary atherosclerosis 10/06/2021   Dyspnea on exertion 10/06/2021   Polyarthralgia 09/30/2021   Sjogren syndrome, unspecified ([place]) 09/30/2021   Fibromyalgia 09/30/2021   Age-related osteoporosis without current pathological fracture 09/09/2021   Allergic rhinitis 09/09/2021   Anxiety 09/09/2021   Asthma 09/09/2021   Chronic kidney disease 09/09/2021   Chronic pain syndrome 09/09/2021   Colon polyp 09/09/2021   GERD (gastroesophageal reflux disease) 09/09/2021   Hypercalcemia  09/09/2021   Hypertension 09/09/2021   Major depressive disorder, single episode 09/09/2021   Myopathy 09/09/2021   Neuropathy 09/09/2021   Reflux esophagitis 09/09/2021   Sjogren's disease ([place]) 09/09/2021   Mild intermittent asthma without complication 09/73/5329   B12 deficiency 05/06/2020   Colitis 05/06/2020   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[place]) 05/06/2020   Iron deficiency anemia due to chronic blood loss 05/06/2020   Benign essential HTN 08/29/2019   Class 2 obesity due to excess calories without serious comorbidity with body mass index (BMI) of 36.0 to 36.9 in adult 08/29/2019   Dyslipidemia 08/29/2019   Type 2 diabetes mellitus without complication, without long-term current use of insulin ([place]) 08/29/2019   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   Pain in right hand 08/09/2018   Tenosynovitis of left hand 08/09/2018   Spinal stenosis of lumbar region 12/13/2017   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   Past Medical History:  Diagnosis Date   Age-related osteoporosis without current pathological fracture    Allergic rhinitis    Anxiety    Asthma    B12 deficiency 05/06/2020   Benign essential HTN 08/29/2019   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   Chronic kidney disease    Chronic pain syndrome    Class 2 obesity due to excess calories without serious comorbidity with body mass index (BMI) of 36.0 to 36.9 in adult 08/29/2019   Colitis 05/06/2020   Colon polyp    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[place]) 05/06/2020   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   Dyslipidemia 08/29/2019   Fibromyalgia 09/30/2021   GERD (gastroesophageal reflux disease)    Hypercalcemia    Hypertension    Iron deficiency anemia due to chronic blood loss 05/06/2020   Major depressive disorder, single episode    Mild intermittent asthma without complication 92/42/6834   Myopathy    Neuropathy    Pain in right hand 08/09/2018   Polyarthralgia 09/30/2021   Reflux  esophagitis    Sjogren syndrome, unspecified ([place]) 09/30/2021   Sjogren's disease ([place])    Spinal stenosis of lumbar region 12/13/2017   Tenosynovitis of left hand 08/09/2018   Type 2 diabetes mellitus without complication, without long-term current use of insulin ([place]) 08/29/2019    Family History  Problem Relation Age of Onset   Asthma Mother    COPD Mother    Diabetes Mother    Hypertension Mother    Kidney disease Mother    Heart disease Father    Colon polyps Father    Skin cancer Father    Depression Father    Diabetes Father    Colon polyps Sister    Crohn's disease Sister    Diverticulitis Sister    Hypercholesterolemia Sister    Kidney disease Brother    Heart disease Maternal Grandmother  Heart disease Paternal Grandmother    Colon cancer Neg Hx    Rectal cancer Neg Hx    Stomach cancer Neg Hx     Past Surgical History:  Procedure Laterality Date   ABDOMINAL HYSTERECTOMY     CARPAL TUNNEL RELEASE Left 2018   CERVICAL FUSION  10/07/2019   Anterior Cervical Discectomy due to spinal stenosis   CESAREAN SECTION     COLONOSCOPY     UPPER GASTROINTESTINAL ENDOSCOPY     Social History   Occupational History   Not on file  Tobacco Use   Smoking status: Never   Smokeless tobacco: Never  Vaping Use   Vaping Use: Never used  Substance and Sexual Activity   Alcohol use: No    Alcohol/week: 0.0 standard drinks   Drug use: No   Sexual activity: Not on file

## 2021-10-26 ENCOUNTER — Other Ambulatory Visit: Payer: Self-pay

## 2021-10-26 ENCOUNTER — Ambulatory Visit (INDEPENDENT_AMBULATORY_CARE_PROVIDER_SITE_OTHER): Payer: 59

## 2021-10-26 VITALS — Ht 59.0 in | Wt 183.0 lb

## 2021-10-26 DIAGNOSIS — R0609 Other forms of dyspnea: Secondary | ICD-10-CM | POA: Diagnosis not present

## 2021-10-26 DIAGNOSIS — I5043 Acute on chronic combined systolic (congestive) and diastolic (congestive) heart failure: Secondary | ICD-10-CM

## 2021-10-26 MED ORDER — REGADENOSON 0.4 MG/5ML IV SOLN
0.4000 mg | Freq: Once | INTRAVENOUS | Status: AC
  Administered 2021-10-26: 0.4 mg via INTRAVENOUS

## 2021-10-26 MED ORDER — TECHNETIUM TC 99M TETROFOSMIN IV KIT
10.7000 | PACK | Freq: Once | INTRAVENOUS | Status: AC | PRN
  Administered 2021-10-26: 10.7 via INTRAVENOUS

## 2021-10-26 MED ORDER — TECHNETIUM TC 99M TETROFOSMIN IV KIT
30.9000 | PACK | Freq: Once | INTRAVENOUS | Status: AC | PRN
  Administered 2021-10-26: 30.9 via INTRAVENOUS

## 2021-10-27 LAB — MYOCARDIAL PERFUSION IMAGING
LV dias vol: 138 mL (ref 46–106)
LV sys vol: 75 mL
Nuc Stress EF: 46 %
Peak HR: 93 {beats}/min
Rest HR: 71 {beats}/min
Rest Nuclear Isotope Dose: 10.7 mCi
SDS: 1
SRS: 2
SSS: 3
ST Depression (mm): 0 mm
Stress Nuclear Isotope Dose: 30.9 mCi
TID: 1.09

## 2021-10-29 MED ORDER — ENTRESTO 24-26 MG PO TABS: 1.0000 | ORAL_TABLET | Freq: Two times a day (BID) | ORAL | 12 refills | Status: DC

## 2021-11-03 ENCOUNTER — Ambulatory Visit (INDEPENDENT_AMBULATORY_CARE_PROVIDER_SITE_OTHER): Payer: 59 | Admitting: Orthopaedic Surgery

## 2021-11-03 ENCOUNTER — Encounter: Payer: Self-pay | Admitting: Orthopaedic Surgery

## 2021-11-03 ENCOUNTER — Telehealth: Payer: Self-pay

## 2021-11-03 ENCOUNTER — Other Ambulatory Visit: Payer: Self-pay

## 2021-11-03 DIAGNOSIS — M1711 Unilateral primary osteoarthritis, right knee: Secondary | ICD-10-CM

## 2021-11-03 DIAGNOSIS — M25561 Pain in right knee: Secondary | ICD-10-CM

## 2021-11-03 HISTORY — DX: Unilateral primary osteoarthritis, right knee: M17.11

## 2021-11-03 NOTE — Telephone Encounter (Signed)
Right knee gel injection  

## 2021-11-03 NOTE — Progress Notes (Signed)
The patient comes in today for continued follow-up as a relates to her right knee.  She is 61 years old and active.  She had an acute flareup of significant pain with the right knee along the medial joint space.  Her x-rays showed varus malalignment of the knee with significant medial joint space narrowing and osteoarthritis.  I did place a steroid injection in her knee.  She says the pain is still there but is nothing like it was before so the injection was helpful.  She cannot take anti-inflammatories due to GI issues.  She is a diabetic state he tries to stay away from steroids is much as possible.  Examination of the right knee still shows medial joint line tenderness.  She has varus malalignment that is correctable.  I feel that the next step should be hyaluronic acid for the right knee.  I explained the rationale behind this and gave her handout about hyaluronic acid.  I think this is going to help the osteoarthritis pain that she is experiencing and this is appropriate given the failure of a steroid injection.  She will continue to work on activity modification and quad strengthening in the interim we will see if we get this injection ordered and approved for her for her right knee.

## 2021-11-04 NOTE — Telephone Encounter (Signed)
Noted  

## 2021-11-08 ENCOUNTER — Ambulatory Visit (INDEPENDENT_AMBULATORY_CARE_PROVIDER_SITE_OTHER): Payer: 59

## 2021-11-08 ENCOUNTER — Other Ambulatory Visit: Payer: Self-pay

## 2021-11-08 VITALS — BP 142/88 | HR 94 | Ht 59.0 in | Wt 179.6 lb

## 2021-11-08 DIAGNOSIS — Z79899 Other long term (current) drug therapy: Secondary | ICD-10-CM | POA: Diagnosis not present

## 2021-11-08 NOTE — Progress Notes (Signed)
Patient came in today for blood work and to check her vital signs after starting entresto one week ago. Vitals are all stable today and were reviewed by Dr. Agustin Cree DOD in our office today. Patient had her labs completed and was discharged at this time.

## 2021-11-09 ENCOUNTER — Telehealth: Payer: Self-pay | Admitting: Cardiology

## 2021-11-09 ENCOUNTER — Telehealth: Payer: Self-pay

## 2021-11-09 DIAGNOSIS — I10 Essential (primary) hypertension: Secondary | ICD-10-CM

## 2021-11-09 DIAGNOSIS — E785 Hyperlipidemia, unspecified: Secondary | ICD-10-CM

## 2021-11-09 DIAGNOSIS — E119 Type 2 diabetes mellitus without complications: Secondary | ICD-10-CM

## 2021-11-09 LAB — BASIC METABOLIC PANEL
BUN/Creatinine Ratio: 9 (ref 9–23)
BUN: 10 mg/dL (ref 6–24)
CO2: 24 mmol/L (ref 20–29)
Calcium: 9.8 mg/dL (ref 8.7–10.2)
Chloride: 102 mmol/L (ref 96–106)
Creatinine, Ser: 1.11 mg/dL — ABNORMAL HIGH (ref 0.57–1.00)
Glucose: 111 mg/dL — ABNORMAL HIGH (ref 70–99)
Potassium: 5 mmol/L (ref 3.5–5.2)
Sodium: 143 mmol/L (ref 134–144)
eGFR: 58 mL/min/{1.73_m2} — ABNORMAL LOW (ref >59)

## 2021-11-09 NOTE — Telephone Encounter (Signed)
Patient notified of the following per Dr. Raliegh Ip. Aware of blood work request. Order on file

## 2021-11-09 NOTE — Telephone Encounter (Signed)
-----   Message from Park Liter, MD sent at 11/09/2021 10:49 AM EST ----- Chem-7 looks fine.  Continue present management but Chem-7 need to be repeated in about 2 weeks

## 2021-11-09 NOTE — Telephone Encounter (Signed)
Patient returning call for lab results. 

## 2021-11-11 ENCOUNTER — Encounter: Payer: Self-pay | Admitting: Orthopaedic Surgery

## 2021-11-11 ENCOUNTER — Ambulatory Visit (INDEPENDENT_AMBULATORY_CARE_PROVIDER_SITE_OTHER): Payer: 59

## 2021-11-11 ENCOUNTER — Other Ambulatory Visit: Payer: Self-pay

## 2021-11-11 ENCOUNTER — Ambulatory Visit (INDEPENDENT_AMBULATORY_CARE_PROVIDER_SITE_OTHER): Payer: 59 | Admitting: Orthopaedic Surgery

## 2021-11-11 VITALS — Ht 58.5 in | Wt 176.0 lb

## 2021-11-11 DIAGNOSIS — M542 Cervicalgia: Secondary | ICD-10-CM

## 2021-11-11 DIAGNOSIS — M545 Low back pain, unspecified: Secondary | ICD-10-CM

## 2021-11-11 DIAGNOSIS — M5441 Lumbago with sciatica, right side: Secondary | ICD-10-CM

## 2021-11-11 DIAGNOSIS — G8929 Other chronic pain: Secondary | ICD-10-CM | POA: Diagnosis not present

## 2021-11-11 DIAGNOSIS — M5442 Lumbago with sciatica, left side: Secondary | ICD-10-CM | POA: Diagnosis not present

## 2021-11-11 HISTORY — DX: Low back pain, unspecified: M54.50

## 2021-11-11 HISTORY — DX: Cervicalgia: M54.2

## 2021-11-11 NOTE — Progress Notes (Signed)
Office Visit Note   Patient: Crystal Marquez           Date of Birth: 05-02-1961           MRN: [id number] Visit Date: 11/11/2021              Requested by: Barnetta Chapel, NP [address] [place],   [address] PCP: Imagene Riches, NP   Assessment & Plan: Visit Diagnoses:  1. Neck pain   2. Chronic midline low back pain, unspecified whether sciatica present   3. Cervicalgia   4. Chronic bilateral low back pain with bilateral sciatica     Plan: Ms. Bernestine Amass has a very long and complicated history of problems referable to his cervical and lumbar spine.  Her insurance has changed recently and she no longer can visit her prior orthopedist.  About 2 years ago she had an anterior cervical fusion in [facility] for an apparent problem with "spinal stenosis" she notes that one of the screws may have "backed out" but because her insurance changed she longer could visit that same Psychologist, sport and exercise.  She also had visited Dr. Maxie Better at [facility] and for the same reason with insurance change could no longer see him and thus she is here now for her back and her neck.  She is having more trouble with her neck than her back and occasionally will have some discomfort into her left upper extremity.  She has more trouble with her back when she stands or sits for any length of time and will have some referred discomfort into both lower extremities.  She thinks she may have a history of "spinal stenosis" of her back as well and has had cortisone in the past but does not have any of those records.  Obviously is a very complicated problem and I do not have any of her old records of any of her oral surgeons.  We will start with an MRI with a Mars technique of the cervical spine as it is causing her more trouble and then evaluate her lumbar spine.  She has a number of medical comorbidities that might interfere with some of her treatment.  She has had a history of ulcers and cannot take NSAIDs  Follow-Up Instructions:  Return After MRI scan cervical spine.   Orders:  Orders Placed This Encounter  Procedures   XR Cervical Spine 2 or 3 views   XR Lumbar Spine 2-3 Views   MR Cervical Spine w/o contrast   No orders of the defined types were placed in this encounter.     Procedures: No procedures performed   Clinical Data: No additional findings.   Subjective: Chief Complaint  Patient presents with   Neck - Pain   Lower Back - Pain  Patient presents today for neck and lower back pain. She said that she had neck surgery in couple years ago with a physician at [place]. She said that she was better after the surgery, but is now having right arm pain and numbness again. She also has been having lower back pain that radiates down both legs. The right leg is worse than the left. She saw Dr.Beane at [facility] and was told to modify her activities.  Complicated problem with her back and her neck.  Basically because of insurance changes she cannot visit her prior cervical spine surgeon or Dr[facility] Maxie [facility] at [facility].  She is having more trouble with her neck than she is with her  back.  She had an anterior cervical fusion of her neck in 2021 and seems to be having progressive pain occasionally with referred discomfort into her left upper extremity.  With her back she is having discomfort along the lower lumbar spine with some referred pain to both lower extremities when she is up and about.  She denies any bowel or bladder changes.  She has not had any numbness or tingling HPI  Review of Systems   Objective: Vital Signs: Ht 4' 10.5" (1.486 m)    Wt 176 lb (79.8 kg)    LMP  (LMP Unknown)    BMI 36.16 kg/m   Physical Exam Constitutional:      Appearance: She is well-developed.  Eyes:     Pupils: Pupils are equal, round, and reactive to light.  Pulmonary:     Effort: Pulmonary effort is normal.  Skin:    General: Skin is warm and dry.  Neurological:     Mental Status: She is alert and  oriented to person, place, and time.  Psychiatric:        Behavior: Behavior normal.    Ortho Exam awake alert and oriented x3.  Comfortable sitting.  Limited range of motion of cervical spine.  She lacked about a fingerbreadth and a half to touching her chin to her chest and only about 50% of normal neck extension.  She did have some referred pain to her left arm with neck extension but not with rotation to the right to the left or with flexion.  Brisk reflexes.  Good grip and release neurologically intact.  No shoulder pain.  Well-healed anterior left-sided neck incision  Walks without use of an ambulatory aid painless range of motion of both hips.  Straight leg raise negative.  Reflexes lower extremities were normal and good strength.  Neurologically intact  Specialty Comments:  No specialty comments available.  Imaging: XR Cervical Spine 2 or 3 views  Result Date: 11/11/2021 Films of the cervical spine obtained in 2 projections.  There is been a prior anterior fusion at C4, C5-6 and C6-7.  There is straightening of the normal cervical lordosis.  The anterior screw at C7 seems to be backing out.  No narrowing at C3-4  XR Lumbar Spine 2-3 Views  Result Date: 11/11/2021 Films lumbar spine obtained in 2 projections.  There is slight straightening of the normal lumbar lordosis.  No listhesis.  There is a right degenerative scoliosis with apex at the proximal lumbar spine.  There is degenerative disc disease changes more in the proximal of the distal lumbar spine and anterior spurring.  No acute changes    PMFS History: Patient Active Problem List   Diagnosis Date Noted   Neck pain 11/11/2021   Low back pain 11/11/2021   Unilateral primary osteoarthritis, right knee 11/03/2021   Coronary atherosclerosis 10/06/2021   Dyspnea on exertion 10/06/2021   Polyarthralgia 09/30/2021   Sjogren syndrome, unspecified ([place]) 09/30/2021   Fibromyalgia 09/30/2021   Age-related osteoporosis without  current pathological fracture 09/09/2021   Allergic rhinitis 09/09/2021   Anxiety 09/09/2021   Asthma 09/09/2021   Chronic kidney disease 09/09/2021   Chronic pain syndrome 09/09/2021   Colon polyp 09/09/2021   GERD (gastroesophageal reflux disease) 09/09/2021   Hypercalcemia 09/09/2021   Hypertension 09/09/2021   Major depressive disorder, single episode 09/09/2021   Myopathy 09/09/2021   Neuropathy 09/09/2021   Reflux esophagitis 09/09/2021   Sjogren's disease ([place]) 09/09/2021   Mild intermittent asthma without complication 70/92/9574  B12 deficiency 05/06/2020   Colitis 05/06/2020   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[place]) 05/06/2020   Iron deficiency anemia due to chronic blood loss 05/06/2020   Benign essential HTN 08/29/2019   Class 2 obesity due to excess calories without serious comorbidity with body mass index (BMI) of 36.0 to 36.9 in adult 08/29/2019   Dyslipidemia 08/29/2019   Type 2 diabetes mellitus without complication, without long-term current use of insulin ([place]) 08/29/2019   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   Pain in right hand 08/09/2018   Tenosynovitis of left hand 08/09/2018   Spinal stenosis of lumbar region 12/13/2017   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   Past Medical History:  Diagnosis Date   Age-related osteoporosis without current pathological fracture    Allergic rhinitis    Anxiety    Asthma    B12 deficiency 05/06/2020   Benign essential HTN 08/29/2019   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   Chronic kidney disease    Chronic pain syndrome    Class 2 obesity due to excess calories without serious comorbidity with body mass index (BMI) of 36.0 to 36.9 in adult 08/29/2019   Colitis 05/06/2020   Colon polyp    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[place]) 05/06/2020   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   Dyslipidemia 08/29/2019   Fibromyalgia 09/30/2021   GERD (gastroesophageal reflux disease)    Hypercalcemia     Hypertension    Iron deficiency anemia due to chronic blood loss 05/06/2020   Major depressive disorder, single episode    Mild intermittent asthma without complication 57/84/6962   Myopathy    Neuropathy    Pain in right hand 08/09/2018   Polyarthralgia 09/30/2021   Reflux esophagitis    Sjogren syndrome, unspecified ([place]) 09/30/2021   Sjogren's disease ([place])    Spinal stenosis of lumbar region 12/13/2017   Tenosynovitis of left hand 08/09/2018   Type 2 diabetes mellitus without complication, without long-term current use of insulin ([place]) 08/29/2019    Family History  Problem Relation Age of Onset   Asthma Mother    COPD Mother    Diabetes Mother    Hypertension Mother    Kidney disease Mother    Heart disease Father    Colon polyps Father    Skin cancer Father    Depression Father    Diabetes Father    Colon polyps Sister    Crohn's disease Sister    Diverticulitis Sister    Hypercholesterolemia Sister    Kidney disease Brother    Heart disease Maternal Grandmother    Heart disease Paternal Grandmother    Colon cancer Neg Hx    Rectal cancer Neg Hx    Stomach cancer Neg Hx     Past Surgical History:  Procedure Laterality Date   ABDOMINAL HYSTERECTOMY     CARPAL TUNNEL RELEASE Left 2018   CERVICAL FUSION  10/07/2019   Anterior Cervical Discectomy due to spinal stenosis   CESAREAN SECTION     COLONOSCOPY     UPPER GASTROINTESTINAL ENDOSCOPY     Social History   Occupational History   Not on file  Tobacco Use   Smoking status: Never   Smokeless tobacco: Never  Vaping Use   Vaping Use: Never used  Substance and Sexual Activity   Alcohol use: No    Alcohol/week: 0.0 standard drinks   Drug use: No   Sexual activity: Not on file

## 2021-11-16 NOTE — Addendum Note (Signed)
Addended by: Marlyne Beards on: 11/16/2021 10:54 AM   Modules accepted: Orders

## 2021-11-18 ENCOUNTER — Telehealth: Payer: Self-pay

## 2021-11-18 ENCOUNTER — Telehealth: Payer: Self-pay | Admitting: Orthopaedic Surgery

## 2021-11-18 NOTE — Telephone Encounter (Signed)
Called pt 1X and left a vm to set an appt with Dr. Durward Fortes for MRI review after 11/22/21 ?

## 2021-11-18 NOTE — Telephone Encounter (Signed)
Patient called into the office stating she has her MRI on the 6th and the next available is the 14th she wanted to know if she can be seen sooner to get her results ?  ?

## 2021-11-22 ENCOUNTER — Other Ambulatory Visit: Payer: Self-pay

## 2021-11-22 ENCOUNTER — Ambulatory Visit (HOSPITAL_COMMUNITY)
Admission: RE | Admit: 2021-11-22 | Discharge: 2021-11-22 | Disposition: A | Discharge status: Home / Self Care | Payer: 59 | Source: Ambulatory Visit | Attending: Orthopaedic Surgery | Admitting: Orthopaedic Surgery

## 2021-11-22 DIAGNOSIS — M542 Cervicalgia: Secondary | ICD-10-CM | POA: Insufficient documentation

## 2021-11-23 LAB — BASIC METABOLIC PANEL
BUN/Creatinine Ratio: 12 (ref 9–23)
BUN: 12 mg/dL (ref 6–24)
CO2: 22 mmol/L (ref 20–29)
Calcium: 9.1 mg/dL (ref 8.7–10.2)
Chloride: 106 mmol/L (ref 96–106)
Creatinine, Ser: 1.01 mg/dL — ABNORMAL HIGH (ref 0.57–1.00)
Glucose: 173 mg/dL — ABNORMAL HIGH (ref 70–99)
Potassium: 4.5 mmol/L (ref 3.5–5.2)
Sodium: 142 mmol/L (ref 134–144)
eGFR: 64 mL/min/{1.73_m2} (ref >59)

## 2021-11-25 ENCOUNTER — Telehealth: Payer: Self-pay

## 2021-11-25 NOTE — Telephone Encounter (Signed)
Called and left a VM advising patient that her insurance does not cover gel injections and advised her of TriVisc and to give me a call back to discuss.  ?

## 2021-11-30 ENCOUNTER — Ambulatory Visit (INDEPENDENT_AMBULATORY_CARE_PROVIDER_SITE_OTHER): Payer: 59 | Admitting: Orthopaedic Surgery

## 2021-11-30 ENCOUNTER — Other Ambulatory Visit: Payer: Self-pay

## 2021-11-30 ENCOUNTER — Encounter: Payer: Self-pay | Admitting: Orthopaedic Surgery

## 2021-11-30 DIAGNOSIS — M545 Low back pain, unspecified: Secondary | ICD-10-CM

## 2021-11-30 DIAGNOSIS — M542 Cervicalgia: Secondary | ICD-10-CM | POA: Diagnosis not present

## 2021-11-30 DIAGNOSIS — G8929 Other chronic pain: Secondary | ICD-10-CM

## 2021-11-30 NOTE — Progress Notes (Signed)
? ?Office Visit Note ?  ?Patient: Crystal Marquez           ?Date of Birth: 29-Jan-1961           ?MRN: [id number] ?Visit Date: 11/30/2021 ?             ?Requested by: Imagene Riches, NP ?[address] ?[place],  [place] [address] ?PCP: Imagene Riches, NP ? ? ?Assessment & Plan: ?Visit Diagnoses:  ?1. Neck pain   ?2. Chronic midline low back pain, unspecified whether sciatica present   ? ? ?Plan: Crystal Marquez was initially seen recently for problems referable to her neck and her back.  She has had a chronic problem with both but her insurance changed recently and she can only be seen within the [facility] system.  Her biggest issue is with her cervical spine.  She has had a prior anterior fusion from C4-C7 but still experiences neck and bilateral upper extremity pain.  I ordered an MRI scan of bilateral contrast and she is here for the results.  At C3-4 there was no canal or foraminal stenosis.  At C2-3 there was a 2 mm anterolisthesis with mild disc bulge and uncovertebral hypertrophy.  There was moderate to advanced left-sided facet arthrosis that had progressed from prior study and mild spinal stenosis with moderate left C3 foraminal narrowing.  The right neuroforamina remain patent.  Fusion was from C4-C7 with still some residual areas of foraminal and central stenosis.  There is a small central disc protrusion at C7-T1 that indented the ventral sac but no significant stenosis.  Patient has been experiencing both neck and upper extremity pain and did have a little bit more pain with neck extension on the left upper extremity and right upper extremity pain with neck flexion.  Long discussion regarding the results and treatment options.  I think it is worth trying a course of physical therapy for both her neck and her back and she agrees.  We will set this up.  The other option is to consider an epidural steroid injection of the cervical spine but she like to wait till she is reevaluated in a month.  Neurologically appears to be  intact ? ?Follow-Up Instructions: Return in about 1 month (around 12/31/2021).  ? ?Orders:  ?Orders Placed This Encounter  ?Procedures  ? Ambulatory referral to Physical Therapy  ? ?No orders of the defined types were placed in this encounter. ? ? ? ? Procedures: ?No procedures performed ? ? ?Clinical Data: ?No additional findings. ? ? ?Subjective: ?Chief Complaint  ?Patient presents with  ? Neck - Follow-up  ?  MRI review  ?Patient presents today for follow up on her neck pain. She had an MRI and is here today for those results.  No change in symptoms ? ?HPI ? ?Review of Systems ? ? ?Objective: ?Vital Signs: LMP  (LMP Unknown)  ? ?Physical Exam ?Constitutional:   ?   Appearance: She is well-developed.  ?Pulmonary:  ?   Effort: Pulmonary effort is normal.  ?Skin: ?   General: Skin is warm and dry.  ?Neurological:  ?   Mental Status: She is alert and oriented to person, place, and time.  ?Psychiatric:     ?   Behavior: Behavior normal.  ? ? ?Ortho Exam awake alert and oriented x3.  Comfortable sitting.  Exam was limited to the cervical spine today.  She could touch her chin to her chest but had some mild discomfort into her right  upper extremity without motion that seem to be relieved with returning her neck to neutral.  She had some limitation of neck extension related to her neck fusion and had some pain into her left upper extremity but resolved when she returned neck to neutral.  Some limitation of rotation of the right to the left with neck pain but no upper extremity discomfort.  Neurologically appears to be intact both upper extremities ? ?Specialty Comments:  ?No specialty comments available. ? ?Imaging: ?No results found. ? ? ?PMFS History: ?Patient Active Problem List  ? Diagnosis Date Noted  ? Neck pain 11/11/2021  ? Low back pain 11/11/2021  ? Unilateral primary osteoarthritis, right knee 11/03/2021  ? Coronary atherosclerosis 10/06/2021  ? Dyspnea on exertion 10/06/2021  ? Polyarthralgia 09/30/2021  ?  Sjogren syndrome, unspecified ([place]) 09/30/2021  ? Fibromyalgia 09/30/2021  ? Age-related osteoporosis without current pathological fracture 09/09/2021  ? Allergic rhinitis 09/09/2021  ? Anxiety 09/09/2021  ? Asthma 09/09/2021  ? Chronic kidney disease 09/09/2021  ? Chronic pain syndrome 09/09/2021  ? Colon polyp 09/09/2021  ? GERD (gastroesophageal reflux disease) 09/09/2021  ? Hypercalcemia 09/09/2021  ? Hypertension 09/09/2021  ? Major depressive disorder, single episode 09/09/2021  ? Myopathy 09/09/2021  ? Neuropathy 09/09/2021  ? Reflux esophagitis 09/09/2021  ? Sjogren's disease ([place]) 09/09/2021  ? Mild intermittent asthma without complication 99/37/1696  ? B12 deficiency 05/06/2020  ? Colitis 05/06/2020  ? 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 Iron deficiency anemia due to chronic blood loss 05/06/2020  ? Benign essential HTN 08/29/2019  ? Class 2 obesity due to excess calories without serious comorbidity with body mass index (BMI) of 36.0 to 36.9 in adult 08/29/2019  ? Dyslipidemia 08/29/2019  ? Type 2 diabetes mellitus without complication, without long-term current use of insulin ([place]) 08/29/2019  ? 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  ? Pain in right hand 08/09/2018  ? Tenosynovitis of left hand 08/09/2018  ? Spinal stenosis of lumbar region 12/13/2017  ? 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  ? ?Past Medical History:  ?Diagnosis Date  ? Age-related osteoporosis without current pathological fracture   ? Allergic rhinitis   ? Anxiety   ? Asthma   ? B12 deficiency 05/06/2020  ? Benign essential HTN 08/29/2019  ? 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  ? Chronic kidney disease   ? Chronic pain syndrome   ? Class 2 obesity due to excess calories without serious comorbidity with body mass index (BMI) of 36.0 to 36.9 in adult 08/29/2019  ? Colitis 05/06/2020  ? Colon polyp   ? 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  ?  Dyslipidemia 08/29/2019  ? Fibromyalgia 09/30/2021  ? GERD (gastroesophageal reflux disease)   ? Hypercalcemia   ? Hypertension   ? Iron deficiency anemia due to chronic blood loss 05/06/2020  ? Major depressive disorder, single episode   ? Mild intermittent asthma without complication 78/93/8101  ? Myopathy   ? Neuropathy   ? Pain in right hand 08/09/2018  ? Polyarthralgia 09/30/2021  ? Reflux esophagitis   ? Sjogren syndrome, unspecified ([place]) 09/30/2021  ? Sjogren's disease ([place])   ? Spinal stenosis of lumbar region 12/13/2017  ? Tenosynovitis of left hand 08/09/2018  ? Type 2 diabetes mellitus without complication, without long-term current use of insulin ([place]) 08/29/2019  ?  ?Family History  ?Problem Relation Age of Onset  ? Asthma Mother   ? COPD Mother   ?  Diabetes Mother   ? Hypertension Mother   ? Kidney disease Mother   ? Heart disease Father   ? Colon polyps Father   ? Skin cancer Father   ? Depression Father   ? Diabetes Father   ? Colon polyps Sister   ? Crohn's disease Sister   ? Diverticulitis Sister   ? Hypercholesterolemia Sister   ? Kidney disease Brother   ? Heart disease Maternal Grandmother   ? Heart disease Paternal Grandmother   ? Colon cancer Neg Hx   ? Rectal cancer Neg Hx   ? Stomach cancer Neg Hx   ?  ?Past Surgical History:  ?Procedure Laterality Date  ? ABDOMINAL HYSTERECTOMY    ? CARPAL TUNNEL RELEASE Left 2018  ? CERVICAL FUSION  10/07/2019  ? Anterior Cervical Discectomy due to spinal stenosis  ? CESAREAN SECTION    ? COLONOSCOPY    ? UPPER GASTROINTESTINAL ENDOSCOPY    ? ?Social History  ? ?Occupational History  ? Not on file  ?Tobacco Use  ? Smoking status: Never  ? Smokeless tobacco: Never  ?Vaping Use  ? Vaping Use: Never used  ?Substance and Sexual Activity  ? Alcohol use: No  ?  Alcohol/week: 0.0 standard drinks  ? Drug use: No  ? Sexual activity: Not on file  ? ? ? ? ? ? ?

## 2021-12-09 ENCOUNTER — Other Ambulatory Visit: Payer: Self-pay | Admitting: Gastroenterology

## 2021-12-13 ENCOUNTER — Ambulatory Visit: Payer: 59 | Admitting: Rehabilitative and Restorative Service Providers"

## 2021-12-13 NOTE — Therapy (Incomplete)
?OUTPATIENT PHYSICAL THERAPY THORACOLUMBAR EVALUATION ? ? ?Patient Name: Crystal Marquez ?MRN: [id number] ?DOB:1961/05/12, 61 y.o., female ?Today's Date: 12/13/2021 ? ? ? ?Past Medical History:  ?Diagnosis Date  ? Age-related osteoporosis without current pathological fracture   ? Allergic rhinitis   ? Anxiety   ? Asthma   ? B12 deficiency 05/06/2020  ? Benign essential HTN 08/29/2019  ? 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  ? Chronic kidney disease   ? Chronic pain syndrome   ? Class 2 obesity due to excess calories without serious comorbidity with body mass index (BMI) of 36.0 to 36.9 in adult 08/29/2019  ? Colitis 05/06/2020  ? Colon polyp   ? 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  ? Dyslipidemia 08/29/2019  ? Fibromyalgia 09/30/2021  ? GERD (gastroesophageal reflux disease)   ? Hypercalcemia   ? Hypertension   ? Iron deficiency anemia due to chronic blood loss 05/06/2020  ? Major depressive disorder, single episode   ? Mild intermittent asthma without complication 07/16/2535  ? Myopathy   ? Neuropathy   ? Pain in right hand 08/09/2018  ? Polyarthralgia 09/30/2021  ? Reflux esophagitis   ? Sjogren syndrome, unspecified ([place]) 09/30/2021  ? Sjogren's disease ([place])   ? Spinal stenosis of lumbar region 12/13/2017  ? Tenosynovitis of left hand 08/09/2018  ? Type 2 diabetes mellitus without complication, without long-term current use of insulin ([place]) 08/29/2019  ? ?Past Surgical History:  ?Procedure Laterality Date  ? ABDOMINAL HYSTERECTOMY    ? CARPAL TUNNEL RELEASE Left 2018  ? CERVICAL FUSION  10/07/2019  ? Anterior Cervical Discectomy due to spinal stenosis  ? CESAREAN SECTION    ? COLONOSCOPY    ? UPPER GASTROINTESTINAL ENDOSCOPY    ? ?Patient Active Problem List  ? Diagnosis Date Noted  ? Neck pain 11/11/2021  ? Low back pain 11/11/2021  ? Unilateral primary osteoarthritis, right knee 11/03/2021  ? Coronary atherosclerosis 10/06/2021  ? Dyspnea on exertion  10/06/2021  ? Polyarthralgia 09/30/2021  ? Sjogren syndrome, unspecified ([place]) 09/30/2021  ? Fibromyalgia 09/30/2021  ? Age-related osteoporosis without current pathological fracture 09/09/2021  ? Allergic rhinitis 09/09/2021  ? Anxiety 09/09/2021  ? Asthma 09/09/2021  ? Chronic kidney disease 09/09/2021  ? Chronic pain syndrome 09/09/2021  ? Colon polyp 09/09/2021  ? GERD (gastroesophageal reflux disease) 09/09/2021  ? Hypercalcemia 09/09/2021  ? Hypertension 09/09/2021  ? Major depressive disorder, single episode 09/09/2021  ? Myopathy 09/09/2021  ? Neuropathy 09/09/2021  ? Reflux esophagitis 09/09/2021  ? Sjogren's disease ([place]) 09/09/2021  ? Mild intermittent asthma without complication 64/40/3474  ? B12 deficiency 05/06/2020  ? Colitis 05/06/2020  ? 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 Iron deficiency anemia due to chronic blood loss 05/06/2020  ? Benign essential HTN 08/29/2019  ? Class 2 obesity due to excess calories without serious comorbidity with body mass index (BMI) of 36.0 to 36.9 in adult 08/29/2019  ? Dyslipidemia 08/29/2019  ? Type 2 diabetes mellitus without complication, without long-term current use of insulin ([place]) 08/29/2019  ? 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  ? Pain in right hand 08/09/2018  ? Tenosynovitis of left hand 08/09/2018  ? Spinal stenosis of lumbar region 12/13/2017  ? 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  ? ? ?PCP: Imagene Riches, NP ? ?REFERRING PROVIDER: Garald Balding, MD ? ?REFERRING DIAG: M54.2 (ICD-10-CM) - Neck pain ?M54.50,G89.29 (ICD-10-CM) - Chronic midline low back pain, unspecified whether sciatica present ? ?  THERAPY DIAG:  ?No diagnosis found. ? ?ONSET DATE: *** ? ?SUBJECTIVE:                                                                                                                                                                                          ? ?SUBJECTIVE STATEMENT: ?*** ?PERTINENT HISTORY:  ?HTN, fibromyalgia,  anxiety, CKD, hypercalcemia, DM ? ?PAIN:  ?Are you having pain? Yes: NPRS scale: ***/10 ?Pain location: *** ?Pain description: *** ?Aggravating factors: *** ?Relieving factors: *** ? ? ?PRECAUTIONS: History of fusion C4-C7 ? ?WEIGHT BEARING RESTRICTIONS No ? ?FALLS:  ?Has patient fallen in last 6 months? No, Number of falls: 0 ? ? ?LIVING ENVIRONMENT: ?Lives with: {[facility] lives with:25569::"lives with their family"} ?Lives in: {Lives in:25570} ?Stairs: {opstairs:27293} ?Has following equipment at home: {Assistive devices:23999} ? ?OCCUPATION: *** ? ?PLOF: Independent ? ?PATIENT GOALS Reduce pain ? ? ?OBJECTIVE:  ? ?DIAGNOSTIC FINDINGS:  ?12/13/2021: At C3-4 there was no canal or foraminal stenosis.  At C2-3 there was a 2 mm anterolisthesis with mild disc bulge and uncovertebral hypertrophy.  There was moderate to advanced left-sided facet arthrosis that had progressed from prior study and mild spinal stenosis with moderate left C3 foraminal narrowing.  The right neuroforamina remain patent.  Fusion was from C4-C7 with still some residual areas of foraminal and central stenosis.  There is a small central disc protrusion at C7-T1 that indented the ventral sac but no significant stenosis. ? ?PATIENT SURVEYS:  ?FOTO intake:   predicted:   ? ?SCREENING FOR RED FLAGS: ?Bowel or bladder incontinence: {Yes/No:304960894} ?Spinal tumors: {Yes/No:304960894} ?Cauda equina syndrome: {Yes/No:304960894} ?Compression fracture: {Yes/No:304960894} ?Abdominal aneurysm: {Yes/No:304960894} ? ?COGNITION: ? 12/13/2021: Overall cognitive status: Within functional limits for tasks assessed   ?  ?SENSATION: ?{sensation:27233} ? ?MUSCLE LENGTH: ?Hamstrings: Right *** deg; Left *** deg ?Thomas test: Right *** deg; Left *** deg ? ?POSTURE:  ? 12/13/2021 ? ?PALPATION: ?12/13/2021: ? ?CERVICAL AROM: ? ?Active  AROM  ?12/13/2021  ?Flexion   ?Extension   ?Right lateral flexion   ?Left lateral flexion   ?Right rotation   ?Left rotation   ? (Blank rows =  not tested) ? ? ?LUMBAR ROM:  ? ?Active  AROM  ?12/13/2021  ?Flexion   ?Extension   ?Right lateral flexion   ?Left lateral flexion   ?Right rotation   ?Left rotation   ? (Blank rows = not tested) ? ?LE ROM: ? ?{AROM/PROM:27142}  Right ?12/13/2021 Left ?12/13/2021  ?Hip flexion    ?Hip extension    ?Hip abduction    ?Hip adduction    ?Hip internal rotation    ?Hip external rotation    ?  Knee flexion    ?Knee extension    ?Ankle dorsiflexion    ?Ankle plantarflexion    ?Ankle inversion    ?Ankle eversion    ? (Blank rows = not tested) ? ?LE MMT: ? ?MMT Right ?12/13/2021 Left ?12/13/2021  ?Hip flexion    ?Hip extension    ?Hip abduction    ?Hip adduction    ?Hip internal rotation    ?Hip external rotation    ?Knee flexion    ?Knee extension    ?Ankle dorsiflexion    ?Ankle plantarflexion    ?Ankle inversion    ?Ankle eversion    ? (Blank rows = not tested) ? ?LUMBAR SPECIAL TESTS:  ?{lumbar special test:25242} ? ?FUNCTIONAL TESTS:  ?{Functional tests:24029} ? ?GAIT: ?Distance walked: *** ?Assistive device utilized: {Assistive devices:23999} ?Level of assistance: {Levels of assistance:24026} ?Comments: *** ? ? ? ?TODAY'S TREATMENT  ?12/13/2021: ?  Therex: HEP instruction/performance c cues for techniques, handout provided.  Trial set performed of each for comprehension and symptom assessment.  See below for exercise list. ? ? ?PATIENT EDUCATION:  ?12/13/2021: ?Education details: HEP, POC ?Person educated: Patient ?Education method: Explanation, Demonstration, Verbal cues, and Handouts ?Education comprehension: verbalized understanding and returned demonstration ? ? ?HOME EXERCISE PROGRAM: ?*** ? ?ASSESSMENT: ? ?CLINICAL IMPRESSION: ?Patient is a 61 y.o. who comes to clinic with complaints of *** pain with mobility, strength and movement coordination deficits that impair their ability to perform usual daily and recreational functional activities without increase difficulty/symptoms at this time.  Patient to benefit from skilled  PT services to address impairments and limitations to improve to previous level of function without restriction secondary to condition.  ? ? ?OBJECTIVE IMPAIRMENTS {opptimpairments:25111}.  ? ?ACTIVITY LIMITATI

## 2021-12-20 ENCOUNTER — Ambulatory Visit (INDEPENDENT_AMBULATORY_CARE_PROVIDER_SITE_OTHER): Payer: 59 | Admitting: Pulmonary Disease

## 2021-12-20 ENCOUNTER — Encounter: Payer: Self-pay | Admitting: Pulmonary Disease

## 2021-12-20 VITALS — BP 124/76 | HR 72 | Temp 98.6°F | Ht 59.0 in | Wt 182.6 lb

## 2021-12-20 DIAGNOSIS — R0609 Other forms of dyspnea: Secondary | ICD-10-CM

## 2021-12-20 DIAGNOSIS — J4520 Mild intermittent asthma, uncomplicated: Secondary | ICD-10-CM

## 2021-12-20 MED ORDER — FLUTICASONE FUROATE-VILANTEROL 200-25 MCG/ACT IN AEPB: 1.0000 | INHALATION_SPRAY | Freq: Every day | RESPIRATORY_TRACT | 3 refills | Status: DC

## 2021-12-20 NOTE — Patient Instructions (Addendum)
Nice to see you again  ? ?I refilled the Breo today ? ?We can consider stronger inhaler in the future ? ?Return to clinic in 3 months or sooner as needed ?

## 2021-12-20 NOTE — Progress Notes (Signed)
? ?'@Patient'$  ID: Crystal Marquez, female    DOB: Nov 19, 1960, 61 y.o.   MRN: [id number] ? ?Chief Complaint  ?Patient presents with  ? Follow-up  ?  Follow up. We have here on Breo and it is working well for her asthma.   ? ? ?Referring provider: ?Imagene Riches, NP ? ?HPI:  ? ?61 y.o. woman whom we are seeing in follow up for dyspnea, wheezing felt to be related to asthma.  Most recent cardiology visit reviewed. ? ?Returns for scheduled follow-up.  Prescribed Breo at last visit.  She feels like this has helped her dyspnea.  Improved overall.  Still a bit more dyspneic than baseline.  No real complaints.  Feels like is working well.  Discussed role and rationale for escalating therapy to triple inhaled therapy but at this time given her control of symptoms not feel is warranted after shared decision making. ? ?HPI at initial visit: ?Patient in normal state of health.  Got COVID October 2022.  Since then she is had intermittent issues with shortness of breath, wheezing.  Occasional cough.  Sometimes worse in the evenings.  Worse with exertion although can occur at rest as well.  The feeling of inability get deep breath, mild chest tightness.  Seems to come and go.  Some days are better than others.  No other seasonal environmental factors he can identify to make things better or worse.  Mild relief with prednisone, albuterol.  However after prednisone symptoms returned.  She reports history of recurrent bronchitis usually at least once a year sometimes twice a year with season changes. ? ?Reviewed CT chest 08/23/2021 reveals clear lungs bilaterally on my review interpretation.  Reviewed echocardiogram 09/02/2021 that shows reduction in EF to 40% as well as grade 2 diastolic dysfunction and dilated left atrium, moderate MVR, normal RA size and normal RV function and size. ? ? 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 ? ?Questionaires / Pulmonary Flowsheets:  ? ?ACT:  ?   ? View : No data to display.  ?  ?  ?  ? ? ?MMRC: ?   ? View : No data to display.  ?  ?  ?  ? ? ?[place]:  ?   ? View : No data to display.  ?  ?  ?  ? ? ?Tests:  ? ?FENO:  ?No results found for: NITRICOXIDE ? ?PFT: ?   ? View : No data to display.  ?  ?  ?  ? ? ?WALK:  ?   ? View : No data to display.  ?  ?  ?  ? ? ?Imaging: ?Personally reviewed and as per EMR discussion this note ?MR Cervical Spine w/o contrast ? ?Result Date: 11/22/2021 ?CLINICAL DATA:  Initial evaluation for chronic neck pain with radiation into the right upper extremity. EXAM: MRI CERVICAL SPINE WITHOUT CONTRAST TECHNIQUE: Multiplanar, multisequence MR imaging of the cervical spine was performed. No intravenous contrast was administered. COMPARISON:  MRI from 08/02/2019. FINDINGS: Alignment: Straightening of the normal cervical lordosis. 2 mm anterolisthesis of C2 on C3, mildly progressed from prior. Vertebrae: Susceptibility artifact related to prior ACDF at C4-C7. Vertebral body height maintained without acute or chronic fracture. Bone marrow signal intensity within normal  limits. No discrete or worrisome osseous lesions or abnormal marrow edema. Cord: Normal signal morphology. Posterior Fossa, vertebral arteries, paraspinal tissues: Empty sella noted. Visualized brain and posterior fossa otherwise unremarkable. Craniocervical junction normal. Paraspinous soft tissues within normal limits. Disc levels: C2-C3: 2 mm anterolisthesis with mild disc bulge and uncovertebral hypertrophy. Moderate to advanced left-sided facet arthrosis, progressed from prior. Mild spinal stenosis with moderate left C3 foraminal narrowing. Right neural foramina remains patent. C3-C4:  Minimal annular  disc bulge.  No canal or foraminal stenosis. C4-C5: Prior fusion. Residual endplate osseous ridging indents and partially effaces the ventral thecal sac. Mild cord flattening without cord signal changes. Residual mild spinal stenosis. Right-sided uncovertebral hypertrophy with residual mild right C5 foraminal narrowing. C5-C6: Prior fusion. Residual right paracentral osseous ridging flattens and partially effaces the ventral thecal sac. Residual mild spinal stenosis with mild cord flattening but no cord signal changes. Residual mild right C6 foraminal narrowing. C6-C7: Prior fusion. Residual endplate spurring flattens and partially faces the ventral thecal sac. Residual moderate spinal stenosis with mild cord flattening but no cord signal changes. Mild to moderate bilateral C7 foraminal stenosis, left greater than right. C7-T1: Small central disc protrusion indents the ventral thecal sac (series 8, image 34). No significant spinal stenosis. Foramina remain patent. Visualized upper thoracic spine demonstrates no significant finding. IMPRESSION: 1. Prior ACDF at C4-C7. Residual endplate and uncovertebral spurring at these levels with resultant mild to moderate diffuse spinal stenosis, most pronounced at C6-7. Residual mild right C5 and C6 foraminal narrowing, with mild to moderate bilateral C7 foraminal stenosis. 2. Progressive 2 mm facet mediated anterolisthesis of C2 on C3 with resultant mild canal and moderate left C3 foraminal stenosis. 3. Small central disc protrusion at C7-T1 without significant stenosis. 4. Empty sella. Electronically Signed   By: Jeannine Boga M.D.   On: 11/22/2021 19:19   ? ?Lab Results: ?Personally reviewed via [place] with mild elevation in eosinophils 200 and 02/2020 ?CBC ?No results found for: WBC, RBC, HGB, HCT, PLT, MCV, MCH, MCHC, RDW, LYMPHSABS, MONOABS, EOSABS, BASOSABS ? ?BMET ?   ?Component Value Date/Time  ? NA 142 11/22/2021 1057  ? K 4.5 11/22/2021 1057   ? CL 106 11/22/2021 1057  ? CO2 22 11/22/2021 1057  ? GLUCOSE 173 (H) 11/22/2021 1057  ? BUN 12 11/22/2021 1057  ? CREATININE 1.01 (H) 11/22/2021 1057  ? CALCIUM 9.1 11/22/2021 1057  ? ? ?BNP ?No results found for: BNP ? ?ProBNP ?No results found for: PROBNP ? ?Specialty Problems   ? ?  ? Pulmonary Problems  ? Mild intermittent asthma without complication  ? Allergic rhinitis  ? Asthma  ? Dyspnea on exertion  ? ? ?Allergies  ?Allergen Reactions  ? Lisinopril Shortness Of Breath  ?  Bloating   ? ? ?Immunization History  ?Administered Date(s) Administered  ? Influenza,inj,quad, With Preservative 06/19/2017  ? Influenza-Unspecified 06/28/2021  ? PFIZER(Purple Top)SARS-COV-2 Vaccination 12/19/2019, 01/13/2020  ? ? ?Past Medical History:  ?Diagnosis Date  ? Age-related osteoporosis without current pathological fracture   ? Allergic rhinitis   ? Anxiety   ? Asthma   ? B12 deficiency 05/06/2020  ? Benign essential HTN 08/29/2019  ? 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  ? Chronic kidney disease   ? Chronic pain syndrome   ? Class 2 obesity due to excess calories without serious comorbidity with body mass index (BMI) of 36.0 to 36.9 in adult 08/29/2019  ? Colitis 05/06/2020  ? Colon polyp   ?  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  ? Dyslipidemia 08/29/2019  ? Fibromyalgia 09/30/2021  ? GERD (gastroesophageal reflux disease)   ? Hypercalcemia   ? Hypertension   ? Iron deficiency anemia due to chronic blood loss 05/06/2020  ? Major depressive disorder, single episode   ? Mild intermittent asthma without complication 70/17/7939  ? Myopathy   ? Neuropathy   ? Pain in right hand 08/09/2018  ? Polyarthralgia 09/30/2021  ? Reflux esophagitis   ? Sjogren syndrome, unspecified ([place]) 09/30/2021  ? Sjogren's disease ([place])   ? Spinal stenosis of lumbar region 12/13/2017  ? Tenosynovitis of left hand 08/09/2018  ? Type 2 diabetes mellitus without complication, without  long-term current use of insulin ([place]) 08/29/2019  ? ? ?Tobacco History: ?Social History  ? ?Tobacco Use  ?Smoking Status Never  ?Smokeless Tobacco Never  ? ?Counseling given: Not Answered ? ? ?Continue to not sm

## 2021-12-27 ENCOUNTER — Ambulatory Visit (INDEPENDENT_AMBULATORY_CARE_PROVIDER_SITE_OTHER): Payer: 59 | Admitting: Orthopaedic Surgery

## 2021-12-27 DIAGNOSIS — M1711 Unilateral primary osteoarthritis, right knee: Secondary | ICD-10-CM

## 2021-12-27 DIAGNOSIS — M5441 Lumbago with sciatica, right side: Secondary | ICD-10-CM

## 2021-12-27 DIAGNOSIS — G8929 Other chronic pain: Secondary | ICD-10-CM

## 2021-12-27 DIAGNOSIS — M5442 Lumbago with sciatica, left side: Secondary | ICD-10-CM

## 2021-12-27 DIAGNOSIS — M25561 Pain in right knee: Secondary | ICD-10-CM | POA: Diagnosis not present

## 2021-12-27 NOTE — Progress Notes (Signed)
The patient is being seen in the office today in follow-up as a relates to her significant right knee pain.  She has known osteoarthritis of that right knee with bone-on-bone wear in the medial compartment.  A steroid injection in early February did help temporize the pain.  Her insurance company does not cover hyaluronic acid and she cannot afford to pay this out-of-pocket.  She is actually starting physical therapy later this week for her cervical spine.  She has also known lumbar spinal stenosis is having some radicular symptoms in the right leg but most of this to me is related to her knee.  There is varus malalignment of her right knee and continued medial joint line tenderness on my exam today.  There is also pain in the calf and lower ankle pain. ? ?Physical therapy should also work on strengthening her right knee and any modalities to decrease any low back pain as well.  There is really nothing else I can recommend from my standpoint other than knee replacement.  We will see her back in 6 weeks because we can always try a steroid injection and if needed. ?

## 2021-12-29 ENCOUNTER — Ambulatory Visit: Payer: 59 | Admitting: Rehabilitative and Restorative Service Providers"

## 2022-01-06 ENCOUNTER — Ambulatory Visit: Payer: 59 | Admitting: Nurse Practitioner

## 2022-01-27 ENCOUNTER — Telehealth: Payer: Self-pay | Admitting: Pulmonary Disease

## 2022-01-27 DIAGNOSIS — J4520 Mild intermittent asthma, uncomplicated: Secondary | ICD-10-CM

## 2022-01-27 NOTE — Telephone Encounter (Signed)
Called and left voicemail for patient to call office back in regards to the Trelegy samples. We need to know what dose she was placed on? And if she is still taking her Breo with it?  And how long she has been on the current sample of Trelegy? ?

## 2022-01-27 NOTE — Telephone Encounter (Signed)
Called patient and she states that for the past two weeks now she has been trailing Trelegy 200 from her PCP. She states that she is doing a lot better on this inhaler. She has stopped the Breo while trailing this medication. She is wanting to know if she could go on this inhaler and stop the [place] all together? ? ?MH please advise ?

## 2022-01-31 NOTE — Telephone Encounter (Signed)
ATC patient. LVMTCB. 

## 2022-01-31 NOTE — Telephone Encounter (Signed)
Patient is checking on message sent. Patient phone number is [phone]. ?

## 2022-01-31 NOTE — Telephone Encounter (Signed)
Ok with me 

## 2022-02-01 ENCOUNTER — Emergency Department (HOSPITAL_COMMUNITY)
Admission: EM | Admit: 2022-02-01 | Discharge: 2022-02-02 | Disposition: A | Discharge status: Home / Self Care | Payer: 59 | Attending: Emergency Medicine | Admitting: Emergency Medicine

## 2022-02-01 ENCOUNTER — Other Ambulatory Visit: Payer: Self-pay

## 2022-02-01 ENCOUNTER — Encounter (HOSPITAL_COMMUNITY): Payer: Self-pay | Admitting: Emergency Medicine

## 2022-02-01 ENCOUNTER — Emergency Department (HOSPITAL_COMMUNITY): Payer: 59

## 2022-02-01 DIAGNOSIS — M545 Low back pain, unspecified: Secondary | ICD-10-CM | POA: Diagnosis present

## 2022-02-01 DIAGNOSIS — R1031 Right lower quadrant pain: Secondary | ICD-10-CM | POA: Diagnosis not present

## 2022-02-01 DIAGNOSIS — W01190A Fall on same level from slipping, tripping and stumbling with subsequent striking against furniture, initial encounter: Secondary | ICD-10-CM | POA: Insufficient documentation

## 2022-02-01 DIAGNOSIS — W19XXXA Unspecified fall, initial encounter: Secondary | ICD-10-CM

## 2022-02-01 LAB — COMPREHENSIVE METABOLIC PANEL
ALT: 22 U/L (ref 0–44)
AST: 25 U/L (ref 15–41)
Albumin: 4.3 g/dL (ref 3.5–5.0)
Alkaline Phosphatase: 40 U/L (ref 38–126)
Anion gap: 8 (ref 5–15)
BUN: 17 mg/dL (ref 6–20)
CO2: 27 mmol/L (ref 22–32)
Calcium: 9.7 mg/dL (ref 8.9–10.3)
Chloride: 107 mmol/L (ref 98–111)
Creatinine, Ser: 1.23 mg/dL — ABNORMAL HIGH (ref 0.44–1.00)
GFR, Estimated: 51 mL/min — ABNORMAL LOW (ref >60)
Glucose, Bld: 85 mg/dL (ref 70–99)
Potassium: 4.8 mmol/L (ref 3.5–5.1)
Sodium: 142 mmol/L (ref 135–145)
Total Bilirubin: 0.5 mg/dL (ref 0.3–1.2)
Total Protein: 6.8 g/dL (ref 6.5–8.1)

## 2022-02-01 LAB — URINALYSIS, ROUTINE W REFLEX MICROSCOPIC
Bacteria, UA: NONE SEEN
Bilirubin Urine: NEGATIVE
Glucose, UA: >=500 mg/dL — AB
Hgb urine dipstick: NEGATIVE
Ketones, ur: NEGATIVE mg/dL
Leukocytes,Ua: NEGATIVE
Nitrite: NEGATIVE
Protein, ur: NEGATIVE mg/dL
Specific Gravity, Urine: 1.011 (ref 1.005–1.030)
pH: 6 (ref 5.0–8.0)

## 2022-02-01 LAB — CBC WITH DIFFERENTIAL/PLATELET
Abs Immature Granulocytes: 0.04 10*3/uL (ref 0.00–0.07)
Basophils Absolute: 0.1 10*3/uL (ref 0.0–0.1)
Basophils Relative: 1 %
Eosinophils Absolute: 0.2 10*3/uL (ref 0.0–0.5)
Eosinophils Relative: 2 %
HCT: 49.5 % — ABNORMAL HIGH (ref 36.0–46.0)
Hemoglobin: 15.6 g/dL — ABNORMAL HIGH (ref 12.0–15.0)
Immature Granulocytes: 0 %
Lymphocytes Relative: 21 %
Lymphs Abs: 2.2 10*3/uL (ref 0.7–4.0)
MCH: 28.2 pg (ref 26.0–34.0)
MCHC: 31.5 g/dL (ref 30.0–36.0)
MCV: 89.4 fL (ref 80.0–100.0)
Monocytes Absolute: 0.6 10*3/uL (ref 0.1–1.0)
Monocytes Relative: 6 %
Neutro Abs: 7.4 10*3/uL (ref 1.7–7.7)
Neutrophils Relative %: 70 %
Platelets: 201 10*3/uL (ref 150–400)
RBC: 5.54 MIL/uL — ABNORMAL HIGH (ref 3.87–5.11)
RDW: 13.5 % (ref 11.5–15.5)
WBC: 10.6 10*3/uL — ABNORMAL HIGH (ref 4.0–10.5)
nRBC: 0 % (ref 0.0–0.2)

## 2022-02-01 LAB — CBG MONITORING, ED: Glucose-Capillary: 69 mg/dL — ABNORMAL LOW (ref 70–99)

## 2022-02-01 LAB — LIPASE, BLOOD: Lipase: 30 U/L (ref 11–51)

## 2022-02-01 MED ORDER — IOHEXOL 300 MG/ML  SOLN
100.0000 mL | Freq: Once | INTRAMUSCULAR | Status: AC | PRN
  Administered 2022-02-01: 100 mL via INTRAVENOUS

## 2022-02-01 MED ORDER — METHOCARBAMOL 500 MG PO TABS: 500.0000 mg | ORAL_TABLET | Freq: Two times a day (BID) | ORAL | 0 refills | Status: DC

## 2022-02-01 NOTE — ED Provider Notes (Signed)
? ?[facility]  ?Provider Note ? ?CSN: [id number] ?Arrival date & time: 02/01/22 1356 ? ?History ?Chief Complaint  ?Patient presents with  ? Fall  ? ? ?[name] is a 61 y.o. female with history of spinal stenosis of her neck reports she fell several days ago, tripped as she was coming into her house, landed on her R side. She has had R lower back pain and RLQ abdominal pain since then. She has been able to walk. Some associated diarrhea. No vomiting or fevers. She reports some urinary hesitancy but no dysuria. She takes APAP for pain at home without much relief. Cannot tolerate NSAIDs due to ulcers.  ? ? ?Home Medications ?Prior to Admission medications   ?Medication Sig Start Date End Date Taking? Authorizing Provider  ?methocarbamol (ROBAXIN) 500 MG tablet Take 1 tablet (500 mg total) by mouth 2 (two) times daily. 02/01/22  Yes Truddie Hidden, MD  ?albuterol (PROVENTIL) (2.5 MG/3ML) 0.083% nebulizer solution Take 2.5 mg by nebulization every 6 (six) hours as needed for wheezing or shortness of breath.    [provider]  ?albuterol (VENTOLIN HFA) 108 (90 Base) MCG/ACT inhaler Inhale 1 puff into the lungs every 6 (six) hours as needed for wheezing or shortness of breath.    [provider]  ?cetirizine (ZYRTEC) 10 MG tablet Take 10 mg by mouth at bedtime. 07/17/21   [provider]  ?Cholecalciferol (VITAMIN D3) 125 MCG (5000 UT) CAPS Take 1 capsule by mouth daily.    [provider]  ?[facility] SENNA 8.6 MG tablet TAKE 1 TABLET (8.6 MG TOTAL) BY MOUTH EVERY OTHER DAY. 12/09/21   Daryel November, MD  ?Cyanocobalamin (B-12 COMPLIANCE INJECTION) 1000 MCG/ML KIT Inject 1,000 mcg as directed every 30 (thirty) days.    [provider]  ?cycloSPORINE (RESTASIS) 0.05 % ophthalmic emulsion Place 1 drop into both eyes 2 (two) times daily.    [provider]  ?DULoxetine (CYMBALTA) 60 MG capsule Take 120 mg by mouth daily. 06/20/21    [provider]  ?ferrous sulfate 325 (65 FE) MG tablet Take 325 mg by mouth daily with breakfast.    [provider]  ?fluticasone furoate-vilanterol (BREO ELLIPTA) 200-25 MCG/ACT AEPB Inhale 1 puff into the lungs daily. 12/20/21   Hunsucker, Bonna Gains, MD  ?Folic Acid 0.8 MG CAPS Take 2 capsules by mouth daily.    [provider]  ?hydrocortisone (ANUSOL-HC) 2.5 % rectal cream Place 1 application rectally daily.    [provider]  ?hydroxychloroquine (PLAQUENIL) 200 MG tablet Take 400 mg by mouth daily. 10/17/14   [provider]  ?ipratropium-albuterol (DUONEB) 0.5-2.5 (3) MG/3ML SOLN Take 3 mLs by nebulization daily as needed (shortness of breath).    [provider]  ?JARDIANCE 25 MG TABS tablet Take 25 mg by mouth daily. 09/21/21   [provider]  ?Magnesium 400 MG TABS Take 400 mg by mouth 2 (two) times daily.    [provider]  ?metFORMIN (GLUCOPHAGE) 500 MG tablet Take 500 mg by mouth 2 (two) times daily with a meal.    [provider]  ?montelukast (SINGULAIR) 10 MG tablet Take 10 mg by mouth at bedtime. 08/09/21   [provider]  ?mupirocin ointment (BACTROBAN) 2 % Apply 1 application topically 2 (two) times daily.    [provider]  ?NYSTATIN powder Apply 1 application topically 3 (three) times daily. 07/29/21   [provider]  ?pantoprazole (PROTONIX) 40 MG  tablet Take 40 mg by mouth daily.    [provider]  ?polyethylene glycol (MIRALAX / GLYCOLAX) 17 g packet Take 17 g by mouth every other day.    [provider]  ?potassium chloride SA (K-DUR,KLOR-CON) 20 MEQ tablet Take 20 mEq by mouth daily.    [provider]  ?promethazine (PHENERGAN) 12.5 MG tablet Take 12.5 mg by mouth every 6 (six) hours as needed for nausea or vomiting.    [provider]  ?rosuvastatin (CRESTOR) 5 MG tablet Take 5 mg by mouth daily.    [provider]   ?sacubitril-valsartan (ENTRESTO) 24-26 MG Take 1 tablet by mouth 2 (two) times daily. 10/29/21   Park Liter, MD  ? ? ? ?Allergies    ?Lisinopril ? ? ?Review of Systems   ?Review of Systems ?Please see HPI for pertinent positives and negatives ? ?Physical Exam ?BP (!) 143/93   Pulse 80   Temp 98.7 ?F (37.1 ?C) (Oral)   Resp 16   Ht _0  (1.499 m)   Wt 77.1 kg   LMP  (LMP Unknown)   SpO2 95%   BMI 34.34 kg/m?  ? ?Physical Exam ?Vitals and nursing note reviewed.  ?Constitutional:   ?   Appearance: Normal appearance.  ?HENT:  ?   Head: Normocephalic and atraumatic.  ?   Nose: Nose normal.  ?   Mouth/Throat:  ?   Mouth: Mucous membranes are moist.  ?Eyes:  ?   Extraocular Movements: Extraocular movements intact.  ?   Conjunctiva/sclera: Conjunctivae normal.  ?Cardiovascular:  ?   Rate and Rhythm: Normal rate.  ?Pulmonary:  ?   Effort: Pulmonary effort is normal.  ?   Breath sounds: Normal breath sounds.  ?Abdominal:  ?   General: Abdomen is flat.  ?   Palpations: Abdomen is soft.  ?   Tenderness: There is no abdominal tenderness. There is no guarding or rebound.  ?Musculoskeletal:     ?   General: Tenderness (R lumbar paraspinal muscles, no midline tenderness) present. No swelling. Normal range of motion.  ?   Cervical back: Neck supple.  ?Skin: ?   General: Skin is warm and dry.  ?Neurological:  ?   General: No focal deficit present.  ?   Mental Status: She is alert.  ?Psychiatric:     ?   Mood and Affect: Mood normal.  ? ? ?ED Results / Procedures / Treatments   ?EKG ?None ? ?Procedures ?Procedures ? ?Medications Ordered in the ED ?Medications  ?iohexol (OMNIPAQUE) 300 MG/ML solution 100 mL (100 mLs Intravenous Contrast Given 02/01/22 1840)  ? ? ?Initial Impression and Plan ? Patient here with R sided pain after a fall. Had labs and CT done in triage which showed normal CBC, BMP, Lipase and UA. I personally viewed the images from radiology studies and agree with radiologist interpretation: CT is neg  for acute intraabdominal process or MSK injury. There is degenerative changes in her spine. Overall she has a benign exam. Recommend she continue APAP, add muscle relaxer for her low back pain and follow up with her PCP.  ? ? ?ED Course  ? ?  ? ? ?MDM Rules/Calculators/A&P ?Medical Decision Making ?Problems Addressed: ?Acute right-sided low back pain without sciatica: acute illness or injury ?Fall, initial encounter: acute illness or injury ?Right lower quadrant abdominal pain: acute illness or injury ? ?Amount and/or Complexity of Data Reviewed ?Labs: ordered. Decision-making details documented in ED Course. ?Radiology: ordered and independent interpretation  performed. Decision-making details documented in ED Course. ? ?Risk ?Prescription drug management. ? ? ? ?Final Clinical Impression(s) / ED Diagnoses ?Final diagnoses:  ?Acute right-sided low back pain without sciatica  ?Right lower quadrant abdominal pain  ?Fall, initial encounter  ? ? ?Rx / DC Orders ?ED Discharge Orders   ? ?      Ordered  ?  methocarbamol (ROBAXIN) 500 MG tablet  2 times daily       ? 02/01/22 2333  ? ?  ?  ? ?  ? ?  ?Truddie Hidden, MD ?02/01/22 2333 ? ?

## 2022-02-01 NOTE — ED Triage Notes (Signed)
Patient reports falling about 5 days ago due to her spinal stenosis. Reports falling on right side onto wooden deck. Denies hitting head, no LOC. C/o dizziness, nausea and right lower abdominal pain since fall.  ?

## 2022-02-01 NOTE — ED Notes (Signed)
Patient given juice.  ?

## 2022-02-01 NOTE — ED Provider Triage Note (Signed)
Emergency Medicine Provider Triage Evaluation Note  Crystal Marquez , a 61 y.o. female  was evaluated in triage.  Pt complains of right lower quadrant abdominal pain with right flank pain following a fall 3 days ago.  Denies hitting head or LOC.  Noninsulin-dependent DMT2.  Per patient blood sugars below 100 is considered "low" for her.  Accompanied with diarrhea.  Denies urinary symptoms, constipation, vomiting, vision changes, chest pain, shortness of breath, fever, recent upper respiratory infection.  Hx of HTN, DLD, osteoporosis, anxiety, asthma, CKD, fibromyalgia, CAD, Sjogren's disease, GERD, IDA.  Review of Systems  Positive: As above Negative: As above  Physical Exam  BP (!) 142/94 (BP Location: Right Arm)   Pulse 85   Temp 98.7 F (37.1 C) (Oral)   Resp 16   Ht '4\' 11"'$  (1.499 m)   Wt 77.1 kg   LMP  (LMP Unknown)   SpO2 98%   BMI 34.34 kg/m  Gen:   Awake, no distress   Resp:  Normal effort, CTAB MSK:   Moves extremities without difficulty  Other:  Right lower quadrant and umbilical abdominal tenderness.  Abdomen soft.  No lower extremity edema.  Radial pulses 2+.  RRR without M/R/G.  Jaundice not appreciated.  Medical Decision Making  Medically screening exam initiated at 4:32 PM.  Appropriate orders placed.  Crystal Marquez was informed that the remainder of the evaluation will be completed by another provider, this initial triage assessment does not replace that evaluation, and the importance of remaining in the ED until their evaluation is complete.  Labs, imaging ordered.   Prince Rome, PA-C 02/40/97 1641

## 2022-02-02 ENCOUNTER — Encounter: Payer: Self-pay | Admitting: Pulmonary Disease

## 2022-02-03 DIAGNOSIS — W0110XA Fall on same level from slipping, tripping and stumbling with subsequent striking against unspecified object, initial encounter: Secondary | ICD-10-CM | POA: Insufficient documentation

## 2022-02-03 DIAGNOSIS — Z09 Encounter for follow-up examination after completed treatment for conditions other than malignant neoplasm: Secondary | ICD-10-CM | POA: Insufficient documentation

## 2022-02-04 MED ORDER — TRELEGY ELLIPTA 200-62.5-25 MCG/ACT IN AEPB: 1.0000 | INHALATION_SPRAY | Freq: Every day | RESPIRATORY_TRACT | 4 refills | Status: DC

## 2022-02-04 NOTE — Telephone Encounter (Signed)
Called patient and informed her that I was sending in her Trelegy 200 to her pharmacy that was approved by Dr Silas Flood. Also informed her that I did send a message to Dr Silas Flood about her CT scan. Nothing further needed

## 2022-02-07 ENCOUNTER — Encounter: Payer: Self-pay | Admitting: Orthopaedic Surgery

## 2022-02-07 ENCOUNTER — Ambulatory Visit (INDEPENDENT_AMBULATORY_CARE_PROVIDER_SITE_OTHER): Payer: 59

## 2022-02-07 ENCOUNTER — Ambulatory Visit: Payer: 59 | Admitting: Orthopaedic Surgery

## 2022-02-07 ENCOUNTER — Other Ambulatory Visit: Payer: Self-pay

## 2022-02-07 DIAGNOSIS — M545 Low back pain, unspecified: Secondary | ICD-10-CM

## 2022-02-07 DIAGNOSIS — M542 Cervicalgia: Secondary | ICD-10-CM

## 2022-02-07 DIAGNOSIS — G8929 Other chronic pain: Secondary | ICD-10-CM

## 2022-02-07 NOTE — Progress Notes (Signed)
The patient continues to complain of significant lumbar spine pain.  She was even in the emergency room recently and started on methocarbamol.  At the time she felt like she was having appendicitis.  A CT scan was performed of the abdomen and pelvis.  I was able to look at her lumbar spine from that standpoint of the study as well as get new plain films today.  The CT scan that does show her lumbar spine as well as the plain films show degenerative changes at multiple levels with degenerative disc disease and slight degenerative scoliosis.  She does have global pain along her lumbar spine.  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  She also has cervical spine pain.  She cannot take anti-inflammatories due to previous ulcers and she does not tolerate pain medication well.  She was given methocarbamol and she said that is helping.  It is essential we try outpatient physical therapy for both her lumbar spine and her neck to see if there is any modalities that the therapist can do to help with her pain in these areas.  She has been losing weight so her blood glucose is running better and that may certainly help with her back as well.  We will see her back in 4 weeks after course of therapy and determine whether or not a MRI of the lumbar spine will be warranted.  She agrees with this treatment plan.

## 2022-02-16 ENCOUNTER — Other Ambulatory Visit: Payer: Self-pay

## 2022-02-16 ENCOUNTER — Encounter: Payer: Self-pay | Admitting: Physical Therapy

## 2022-02-16 ENCOUNTER — Ambulatory Visit: Payer: 59 | Admitting: Physical Therapy

## 2022-02-16 DIAGNOSIS — M5459 Other low back pain: Secondary | ICD-10-CM | POA: Diagnosis not present

## 2022-02-16 DIAGNOSIS — M6281 Muscle weakness (generalized): Secondary | ICD-10-CM

## 2022-02-16 DIAGNOSIS — M542 Cervicalgia: Secondary | ICD-10-CM

## 2022-02-16 DIAGNOSIS — R293 Abnormal posture: Secondary | ICD-10-CM | POA: Diagnosis not present

## 2022-02-16 NOTE — Therapy (Signed)
OUTPATIENT PHYSICAL THERAPY THORACOLUMBAR EVALUATION   Patient Name: Crystal Marquez MRN: [id number] DOB:12/17/1960, 61 y.o., female Today's Date: 02/16/2022   PT End of Session - 02/16/22 1553     Visit Number 1    Number of Visits 6    Date for PT Re-Evaluation 03/30/22    Authorization Type [employer]    PT Start Time 1515    PT Stop Time 1550    PT Time Calculation (min) 35 min    Activity Tolerance Patient tolerated treatment well    Behavior During Therapy [facility] for tasks assessed/performed             Past Medical History:  Diagnosis Date   Age-related osteoporosis without current pathological fracture    Allergic rhinitis    Anxiety    Asthma    B12 deficiency 05/06/2020   Benign essential HTN 08/29/2019   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   Chronic kidney disease    Chronic pain syndrome    Class 2 obesity due to excess calories without serious comorbidity with body mass index (BMI) of 36.0 to 36.9 in adult 08/29/2019   Colitis 05/06/2020   Colon polyp    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[place]) 05/06/2020   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   Dyslipidemia 08/29/2019   Fibromyalgia 09/30/2021   GERD (gastroesophageal reflux disease)    Hypercalcemia    Hypertension    Iron deficiency anemia due to chronic blood loss 05/06/2020   Major depressive disorder, single episode    Mild intermittent asthma without complication 40/98/1191   Myopathy    Neuropathy    Pain in right hand 08/09/2018   Polyarthralgia 09/30/2021   Reflux esophagitis    Sjogren syndrome, unspecified ([place]) 09/30/2021   Sjogren's disease ([place])    Spinal stenosis of lumbar region 12/13/2017   Tenosynovitis of left hand 08/09/2018   Type 2 diabetes mellitus without complication, without long-term current use of insulin ([place]) 08/29/2019   Past Surgical History:  Procedure Laterality Date   ABDOMINAL HYSTERECTOMY     CARPAL TUNNEL RELEASE Left 2018   CERVICAL FUSION   10/07/2019   Anterior Cervical Discectomy due to spinal stenosis   CESAREAN SECTION     COLONOSCOPY     UPPER GASTROINTESTINAL ENDOSCOPY     Patient Active Problem List   Diagnosis Date Noted   Neck pain 11/11/2021   Low back pain 11/11/2021   Unilateral primary osteoarthritis, right knee 11/03/2021   Coronary atherosclerosis 10/06/2021   Dyspnea on exertion 10/06/2021   Polyarthralgia 09/30/2021   Sjogren syndrome, unspecified ([place]) 09/30/2021   Fibromyalgia 09/30/2021   Age-related osteoporosis without current pathological fracture 09/09/2021   Allergic rhinitis 09/09/2021   Anxiety 09/09/2021   Asthma 09/09/2021   Chronic kidney disease 09/09/2021   Chronic pain syndrome 09/09/2021   Colon polyp 09/09/2021   GERD (gastroesophageal reflux disease) 09/09/2021   Hypercalcemia 09/09/2021   Hypertension 09/09/2021   Major depressive disorder, single episode 09/09/2021   Myopathy 09/09/2021   Neuropathy 09/09/2021   Reflux esophagitis 09/09/2021   Sjogren's disease ([place]) 09/09/2021   Mild intermittent asthma without complication 47/82/9562   B12 deficiency 05/06/2020   Colitis 05/06/2020   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[place]) 05/06/2020   Iron deficiency anemia due to chronic blood loss 05/06/2020   Benign essential HTN 08/29/2019   Class 2 obesity due to excess calories without serious comorbidity with body mass index (BMI) of 36.0 to 36.9 in adult 08/29/2019   Dyslipidemia 08/29/2019  Type 2 diabetes mellitus without complication, without long-term current use of insulin ([place]) 08/29/2019   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   Pain in right hand 08/09/2018   Tenosynovitis of left hand 08/09/2018   Spinal stenosis of lumbar region 12/13/2017   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    PCP: Heide Scales, NP  REFERRING PROVIDER: Mcarthur Rossetti, MD   REFERRING DIAG: M54.42,M54.41,G89.29 (ICD-10-CM) - Chronic bilateral low back pain with bilateral  sciatica M54.2 (ICD-10-CM) - Cervicalgia   Rationale for Evaluation and Treatment Rehabilitation  THERAPY DIAG:  Cervicalgia - Plan: PT plan of care cert/re-cert  Other low back pain - Plan: PT plan of care cert/re-cert  Abnormal posture - Plan: PT plan of care cert/re-cert  Muscle weakness (generalized) - Plan: PT plan of care cert/re-cert  ONSET DATE: ~8 years  SUBJECTIVE:                                                                                                                                                                                           SUBJECTIVE STATEMENT: Pt is a 61 y/o female who presents to OPPT for chronic neck and lower back pain.  She reports she has spinal stenosis in neck and back.  She did have a fall on her Rt side about a month ago which exacerbated her back pain.  She has a hx of ACDF but reports there is a loose screw but has not has any surgery to repair.  PERTINENT HISTORY:  Osteoporosis, anxiety, asthma, HTN, chronic pain syndrome, obesity, hx ACDF, fibromyalgia, depression, arthritis, Sjogren's syndrome, DM  PAIN:  Are you having pain? Yes: NPRS scale: 5 currently, up to 8-9, at best 0/10 Pain location: low back Rt > Lt Pain description: shooting, occasional pain to toes Aggravating factors: sitting, any prolonged activity, picking up items  Relieving factors: massage/heat   PAIN:  Are you having pain? Yes: NPRS scale: 2 currently, up to 7, at best 0-1/10 Pain location: neck and into bil shoulders Pain description: sharp, tightness Aggravating factors: quick rotational turns, lifting Relieving factors: heat/massage    PRECAUTIONS: Fall  WEIGHT BEARING RESTRICTIONS No  FALLS:  Has patient fallen in last 6 months? Yes. 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  PLOF: Independent and  Leisure: reading, walking, swimming (has her own above ground pool)  PATIENT GOALS improve pain, exercises to help with pain   OBJECTIVE:   DIAGNOSTIC FINDINGS:  The CT scan that does show her lumbar spine as  well as the plain films show degenerative changes at multiple levels with degenerative disc disease and slight degenerative scoliosis   PATIENT SURVEYS:    02/16/22: 37 (predicted 41)  SCREENING FOR RED FLAGS: Bowel or bladder incontinence: No Spinal tumors: No  COGNITION:  Overall cognitive status: Within functional limits for tasks assessed     SENSATION: WFL  MUSCLE LENGTH: Hamstring tightness noted  POSTURE: rounded shoulders, forward head, and increased thoracic kyphosis  PALPATION: 02/16/22 Trigger points in Rt cervical paraspinals, upper traps and levator scapula (deferred lumbar palpation today)  CERVICAL ROM:   Active ROM A/PROM (deg) eval  Flexion 42  Extension 20 (mild pain)  Right lateral flexion 18 (with Rt side pain)  Left lateral flexion 23  Right rotation 67  Left rotation 49 (Rt side pain)   (Blank rows = not tested)  LUMBAR ROM:   Active  A/PROM  eval  Flexion Limited 50%  Extension WNL (directional preference)  Right lateral flexion WNL (Rt side pain)  Left lateral flexion WNL  Right rotation WNL  Left rotation WNL   (Blank rows = not tested)   MMT:    MMT Right eval Left eval  Hip flexion 3/5 3/5  Knee flexion 4/5 4/5  Knee extension 5/5 5/5  Shoulder Flexion 3/5 3/5  Shoulder Abduction 3/5 3/5   (Blank rows = not tested)   GAIT: Independent with amb, mild trendelenburg present bil hips    TODAY'S TREATMENT  02/16/22: See HEP -reviewed with pt and performed trial reps PRN for comprehension   PATIENT EDUCATION:  Education details: HEP Person educated: Patient Education method: Explanation, Demonstration, and Handouts Education comprehension: verbalized understanding, returned demonstration, and needs further  education   HOME EXERCISE PROGRAM: 02/16/22 Access Code: [facility] URL: [email or web address] Date: 02/16/2022 Prepared by: Faustino Congress  Exercises - Standing Upper Trapezius Mobilization with Small Ball  - 2-3 x daily - 7 x weekly - 1 sets - 1-2 reps - 2-3 min hold - Standing Backward Shoulder Rolls  - 2 x daily - 7 x weekly - 1 sets - 10 reps - Seated Scapular Retraction  - 2 x daily - 7 x weekly - 1 sets - 10 reps - 5 sec hold - Seated Upper Trapezius Stretch  - 2 x daily - 7 x weekly - 1 sets - 3 reps - 30 sec hold - Seated Levator Scapulae Stretch  - 2 x daily - 7 x weekly - 1 sets - 1 reps - 30 sec hold - Hooklying Single Knee to Chest  - 2 x daily - 7 x weekly - 1 sets - 3 reps - 30 sec hold - Supine Piriformis Stretch with Foot on Ground  - 2 x daily - 7 x weekly - 1 sets - 3 reps - 30 sec hold - Supine Lower Trunk Rotation  - 2 x daily - 7 x weekly - 1 sets - 3 reps - 30 sec hold  ASSESSMENT:  CLINICAL IMPRESSION: Patient is a 61 y.o. female who was seen today for physical therapy evaluation and treatment for chronic neck and back pain.  She demonstrates decreased strength and ROM as well as postural abnormalities and trigger points affecting functional mobility. She will benefit from PT to address deficits listed.     OBJECTIVE IMPAIRMENTS decreased activity tolerance, decreased mobility, decreased ROM, decreased strength, increased fascial restrictions, increased muscle spasms, impaired flexibility, postural dysfunction, and pain.   ACTIVITY LIMITATIONS carrying, lifting, bending, sitting, standing, transfers, and locomotion level  PARTICIPATION LIMITATIONS: cleaning, laundry, driving, and community activity  PERSONAL FACTORS 3+ comorbidities: Osteoporosis, anxiety, asthma, HTN, chronic pain syndrome, obesity, hx ACDF, fibromyalgia, depression, arthritis, Sjogren's syndrome, DM  are also affecting patient's functional outcome.   REHAB POTENTIAL:  Good  CLINICAL DECISION MAKING: Evolving/moderate complexity  EVALUATION COMPLEXITY: Moderate   GOALS: Goals reviewed with patient? Yes  SHORT TERM GOALS: Target date: 03/09/2022  Independent with initial HEP Goal status: INITIAL   LONG TERM GOALS: Target date: 03/30/2022  Independent with final HEP Goal status: INITIAL  2.  FOTO score improved to 41 Goal status: INITIAL  3.  Bil hip and shoulder strength improved to 4/5 for improved function and mobility Goal status: INIITAL  4.  Report pain < 4/10 with lifting and carrying activiites for improved function Goal status: INITIAL  PLAN: PT FREQUENCY: 1x/week  PT DURATION: 6 weeks  PLANNED INTERVENTIONS: Therapeutic exercises, Therapeutic activity, Neuromuscular re-education, Patient/Family education, Joint mobilization, Aquatic Therapy, Dry Needling, Electrical stimulation, Spinal manipulation, Spinal mobilization, Cryotherapy, Moist heat, Taping, Traction, Ultrasound, Manual therapy, and Re-evaluation.  PLAN FOR NEXT SESSION: review HEP, check palpation lumbar spine, progress postural/core/hip/shoulder strengthening exercises     Laureen Abrahams, PT, DPT 02/16/22 3:55 PM

## 2022-02-25 ENCOUNTER — Encounter: Payer: 59 | Admitting: Physical Therapy

## 2022-03-02 ENCOUNTER — Ambulatory Visit: Payer: 59 | Admitting: Physical Therapy

## 2022-03-02 ENCOUNTER — Encounter: Payer: Self-pay | Admitting: Physical Therapy

## 2022-03-02 DIAGNOSIS — M5459 Other low back pain: Secondary | ICD-10-CM

## 2022-03-02 DIAGNOSIS — R293 Abnormal posture: Secondary | ICD-10-CM

## 2022-03-02 DIAGNOSIS — M542 Cervicalgia: Secondary | ICD-10-CM | POA: Diagnosis not present

## 2022-03-02 DIAGNOSIS — M6281 Muscle weakness (generalized): Secondary | ICD-10-CM

## 2022-03-02 NOTE — Therapy (Addendum)
OUTPATIENT PHYSICAL THERAPY THORACOLUMBAR EVALUATION DISCHARGE SUMMARY   Patient Name: Crystal Marquez MRN: [id number] DOB:1960-11-10, 61 y.o., female Today's Date: 02/16/2022  END OF SESSION    PT End of Session - 03/02/22 0932     Visit Number 2    Number of Visits 6    Date for PT Re-Evaluation 03/30/22    Authorization Type [employer]    PT Start Time 0930    PT Stop Time 1008    PT Time Calculation (min) 38 min    Activity Tolerance Patient tolerated treatment well    Behavior During Therapy [facility] for tasks assessed/performed               Past Medical History:  Diagnosis Date   Age-related osteoporosis without current pathological fracture    Allergic rhinitis    Anxiety    Asthma    B12 deficiency 05/06/2020   Benign essential HTN 08/29/2019   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   Chronic kidney disease    Chronic pain syndrome    Class 2 obesity due to excess calories without serious comorbidity with body mass index (BMI) of 36.0 to 36.9 in adult 08/29/2019   Colitis 05/06/2020   Colon polyp    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[place]) 05/06/2020   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   Dyslipidemia 08/29/2019   Fibromyalgia 09/30/2021   GERD (gastroesophageal reflux disease)    Hypercalcemia    Hypertension    Iron deficiency anemia due to chronic blood loss 05/06/2020   Major depressive disorder, single episode    Mild intermittent asthma without complication 78/58/8502   Myopathy    Neuropathy    Pain in right hand 08/09/2018   Polyarthralgia 09/30/2021   Reflux esophagitis    Sjogren syndrome, unspecified ([place]) 09/30/2021   Sjogren's disease ([place])    Spinal stenosis of lumbar region 12/13/2017   Tenosynovitis of left hand 08/09/2018   Type 2 diabetes mellitus without complication, without long-term current use of insulin ([place]) 08/29/2019   Past Surgical History:  Procedure Laterality Date   ABDOMINAL HYSTERECTOMY     CARPAL TUNNEL  RELEASE Left 2018   CERVICAL FUSION  10/07/2019   Anterior Cervical Discectomy due to spinal stenosis   CESAREAN SECTION     COLONOSCOPY     UPPER GASTROINTESTINAL ENDOSCOPY     Patient Active Problem List   Diagnosis Date Noted   Neck pain 11/11/2021   Low back pain 11/11/2021   Unilateral primary osteoarthritis, right knee 11/03/2021   Coronary atherosclerosis 10/06/2021   Dyspnea on exertion 10/06/2021   Polyarthralgia 09/30/2021   Sjogren syndrome, unspecified ([place]) 09/30/2021   Fibromyalgia 09/30/2021   Age-related osteoporosis without current pathological fracture 09/09/2021   Allergic rhinitis 09/09/2021   Anxiety 09/09/2021   Asthma 09/09/2021   Chronic kidney disease 09/09/2021   Chronic pain syndrome 09/09/2021   Colon polyp 09/09/2021   GERD (gastroesophageal reflux disease) 09/09/2021   Hypercalcemia 09/09/2021   Hypertension 09/09/2021   Major depressive disorder, single episode 09/09/2021   Myopathy 09/09/2021   Neuropathy 09/09/2021   Reflux esophagitis 09/09/2021   Sjogren's disease ([place]) 09/09/2021   Mild intermittent asthma without complication 77/41/2878   B12 deficiency 05/06/2020   Colitis 05/06/2020   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[place]) 05/06/2020   Iron deficiency anemia due to chronic blood loss 05/06/2020   Benign essential HTN 08/29/2019   Class 2 obesity due to excess calories without serious comorbidity with body mass index (BMI) of 36.0 to 36.9  in adult 08/29/2019   Dyslipidemia 08/29/2019   Type 2 diabetes mellitus without complication, without long-term current use of insulin ([place]) 08/29/2019   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   Pain in right hand 08/09/2018   Tenosynovitis of left hand 08/09/2018   Spinal stenosis of lumbar region 12/13/2017   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    PCP: Heide Scales, NP  REFERRING PROVIDER: Mcarthur Rossetti, MD   REFERRING DIAG: M54.42,M54.41,G89.29 (ICD-10-CM) - Chronic  bilateral low back pain with bilateral sciatica M54.2 (ICD-10-CM) - Cervicalgia   Rationale for Evaluation and Treatment Rehabilitation  1. Cervicalgia   2. Other low back pain   3. Abnormal posture   4. Muscle weakness (generalized)      ONSET DATE: ~8 years  SUBJECTIVE:                                                                                                                                                                                           SUBJECTIVE STATEMENT: "I feel like I've been beat."  Reports 2 falls (one falling off toilet after falling asleep, and the other "I slid down."); doing HEP daily, reports they are helping but do hurt occasionally  PERTINENT HISTORY:  Osteoporosis, anxiety, asthma, HTN, chronic pain syndrome, obesity, hx ACDF, fibromyalgia, depression, arthritis, Sjogren's syndrome, DM  PAIN:   Are you having pain? Yes: NPRS scale: 5 currently, up to 8-9, at best 0/10 Pain location: low back Rt > Lt Pain description: shooting, occasional pain to toes Aggravating factors: sitting, any prolonged activity, picking up items  Relieving factors: massage/heat   PAIN:  Are you having pain? Yes: NPRS scale: 3 currently, up to 7, at best 0-1/10 Pain location: neck and into bil shoulders Pain description: sharp, tightness Aggravating factors: quick rotational turns, lifting Relieving factors: heat/massage  PRECAUTIONS: Fall   PATIENT GOALS improve pain, exercises to help with pain   OBJECTIVE:   PATIENT SURVEYS:    02/16/22: 37 (predicted 41)  MUSCLE LENGTH:   02/16/22: Hamstring tightness noted  POSTURE: rounded shoulders, forward head, and increased thoracic kyphosis  PALPATION: 02/16/22 Trigger points in Rt cervical paraspinals, upper traps and levator scapula (deferred lumbar palpation today)  CERVICAL ROM:   Active ROM A/PROM (deg) eval  Flexion 42  Extension 20 (mild pain)  Right lateral flexion 18 (with Rt side pain)  Left  lateral flexion 23  Right rotation 67  Left rotation 49 (Rt side pain)   (Blank rows = not tested)  LUMBAR ROM:   Active  A/PROM  eval  Flexion Limited 50%  Extension WNL (directional preference)  Right  lateral flexion WNL (Rt side pain)  Left lateral flexion WNL  Right rotation WNL  Left rotation WNL   (Blank rows = not tested)   MMT:    MMT Right eval Left eval  Hip flexion 3/5 3/5  Knee flexion 4/5 4/5  Knee extension 5/5 5/5  Shoulder Flexion 3/5 3/5  Shoulder Abduction 3/5 3/5   (Blank rows = not tested)   GAIT: Independent with amb, mild trendelenburg present bil hips    TODAY'S TREATMENT  03/02/22 Therex:     Aerobic: NuStep L5 x 8 min     Sitting: Shoulder rolls backwards x15 reps Scapular retraction x 10 reps bil UT stretch 2x30 sec bil LS stretch 2x30 sec bil     Supine: SKTC 2x30 sec bil Piriformis stretch 2x30 sec bil LTR 2x30 sec bil Bridges 2x10  02/16/22: See HEP -reviewed with pt and performed trial reps PRN for comprehension   PATIENT EDUCATION:  Education details: HEP Person educated: Patient Education method: Explanation, Demonstration, and Handouts Education comprehension: verbalized understanding, returned demonstration, and needs further education   HOME EXERCISE PROGRAM: Access Code: [email or web address] URL: [email or web address] Date: 02/16/2022 Prepared by: Faustino Congress  Exercises - Standing Upper Trapezius Mobilization with Small Ball  - 2-3 x daily - 7 x weekly - 1 sets - 1-2 reps - 2-3 min hold - Standing Backward Shoulder Rolls  - 2 x daily - 7 x weekly - 1 sets - 10 reps - Seated Scapular Retraction  - 2 x daily - 7 x weekly - 1 sets - 10 reps - 5 sec hold - Seated Upper Trapezius Stretch  - 2 x daily - 7 x weekly - 1 sets - 3 reps - 30 sec hold - Seated Levator Scapulae Stretch  - 2 x daily - 7 x weekly - 1 sets - 1 reps - 30 sec hold - Hooklying Single Knee to Chest  - 2 x daily - 7 x weekly - 1 sets - 3  reps - 30 sec hold - Supine Piriformis Stretch with Foot on Ground  - 2 x daily - 7 x weekly - 1 sets - 3 reps - 30 sec hold - Supine Lower Trunk Rotation  - 2 x daily - 7 x weekly - 1 sets - 3 reps - 30 sec hold  ASSESSMENT:  CLINICAL IMPRESSION: Pt tolerated session well today with reduction in pain at the end of session.  Mod cues needed for HEP review at this time.  Will continue to benefit from PT to maximize function.   OBJECTIVE IMPAIRMENTS decreased activity tolerance, decreased mobility, decreased ROM, decreased strength, increased fascial restrictions, increased muscle spasms, impaired flexibility, postural dysfunction, and pain.   ACTIVITY LIMITATIONS carrying, lifting, bending, sitting, standing, transfers, and locomotion level  PARTICIPATION LIMITATIONS: cleaning, laundry, driving, and community activity  PERSONAL FACTORS 3+ comorbidities: Osteoporosis, anxiety, asthma, HTN, chronic pain syndrome, obesity, hx ACDF, fibromyalgia, depression, arthritis, Sjogren's syndrome, DM are also affecting patient's functional outcome.   REHAB POTENTIAL: Good  CLINICAL DECISION MAKING: Evolving/moderate complexity  EVALUATION COMPLEXITY: Moderate   GOALS: Goals reviewed with patient? Yes  SHORT TERM GOALS: Target date: 03/09/2022  Independent with initial HEP Goal status: INITIAL   LONG TERM GOALS: Target date: 03/30/2022  Independent with final HEP Goal status: INITIAL  2.  FOTO score improved to 41 Goal status: INITIAL  3.  Bil hip and shoulder strength improved to 4/5 for improved function and mobility Goal status: INIITAL  4.  Report pain <  4/10 with lifting and carrying activiites for improved function Goal status: INITIAL  PLAN: PT FREQUENCY: 1x/week  PT DURATION: 6 weeks  PLANNED INTERVENTIONS: Therapeutic exercises, Therapeutic activity, Neuromuscular re-education, Patient/Family education, Joint mobilization, Aquatic Therapy, Dry Needling, Electrical  stimulation, Spinal manipulation, Spinal mobilization, Cryotherapy, Moist heat, Taping, Traction, Ultrasound, Manual therapy, and Re-evaluation.  PLAN FOR NEXT SESSION: check palpation lumbar spine, progress postural/core/hip/shoulder strengthening exercises     Laureen Abrahams, PT, DPT 02/16/22 3:55 PM    PHYSICAL THERAPY DISCHARGE SUMMARY  Visits from Start of Care: 2  Current functional level related to goals / functional outcomes: See above   Remaining deficits: See above   Education / Equipment: HEP   Patient agrees to discharge. Patient goals were not met. Patient is being discharged due to not returning since the last visit.  Laureen Abrahams, PT, DPT 04/19/22 8:42 AM  [facility] [address] [place], [place], [address] Phone: [phone]   Fax:  [phone]

## 2022-03-07 ENCOUNTER — Ambulatory Visit: Payer: 59 | Admitting: Orthopaedic Surgery

## 2022-03-07 ENCOUNTER — Encounter: Payer: Self-pay | Admitting: Orthopaedic Surgery

## 2022-03-07 DIAGNOSIS — M25561 Pain in right knee: Secondary | ICD-10-CM | POA: Diagnosis not present

## 2022-03-07 DIAGNOSIS — M5441 Lumbago with sciatica, right side: Secondary | ICD-10-CM | POA: Diagnosis not present

## 2022-03-07 DIAGNOSIS — G8929 Other chronic pain: Secondary | ICD-10-CM

## 2022-03-07 DIAGNOSIS — M5442 Lumbago with sciatica, left side: Secondary | ICD-10-CM

## 2022-03-07 MED ORDER — METHYLPREDNISOLONE ACETATE 40 MG/ML IJ SUSP
40.0000 mg | INTRAMUSCULAR | Status: AC | PRN
  Administered 2022-03-07: 40 mg via INTRA_ARTICULAR

## 2022-03-07 MED ORDER — LIDOCAINE HCL 1 % IJ SOLN
3.0000 mL | INTRAMUSCULAR | Status: AC | PRN
  Administered 2022-03-07: 3 mL

## 2022-03-07 NOTE — Progress Notes (Signed)
Office Visit Note   Patient: Crystal Marquez           Date of Birth: 05/25/1961           MRN: [id number] Visit Date: 03/07/2022              Requested by: Imagene Riches, NP [place],  [place] [address] PCP: Imagene Riches, NP   Assessment & Plan: Visit Diagnoses:  1. Chronic bilateral low back pain with bilateral sciatica   2. Chronic pain of right knee     Plan: I did place a steroid injection in her right knee today which she tolerated well.  At this point we will send her for an MRI of her lumbar spine to rule out nerve compression and stenosis given her continued symptoms in spite of failure of conservative treatment including physical therapy.  We will see her back as we have this MRI.  Follow-Up Instructions: No follow-ups on file.   Orders:  Orders Placed This Encounter  Procedures   Large Joint Inj   No orders of the defined types were placed in this encounter.     Procedures: Large Joint Inj: R knee on 03/07/2022 1:13 PM Indications: diagnostic evaluation and pain Details: 22 G 1.5 in needle, superolateral approach  Arthrogram: No  Medications: 3 mL lidocaine 1 %; 40 mg methylPREDNISolone acetate 40 MG/ML Outcome: tolerated well, no immediate complications Procedure, treatment alternatives, risks and benefits explained, specific risks discussed. Consent was given by the patient. Immediately prior to procedure a time out was called to verify the correct patient, procedure, equipment, support staff and site/side marked as required. Patient was prepped and draped in the usual sterile fashion.       Clinical Data: No additional findings.   Subjective: Chief Complaint  Patient presents with   Lower Back - Follow-up  The patient continues to have low back pain with bilateral sciatica.  She cannot take anti-inflammatories due to ulcers.  She has had methocarbamol which is helped as well as work on activity modification and rest as well as weight loss.   She has now been to physical therapy for her back and still has the same pain with low back pain and bilateral sciatica.  This is worsening her right knee pain.  It has been a long period time since she had a steroid injection in her right knee see if she would like to consider 1 today.  She is a diabetic but reports better blood glucose control.  She is still thinking about the possibility of hyaluronic acid even though her insurance does not cover this.  HPI  Review of Systems She denies any fever, chills, nausea, vomiting  Objective: Vital Signs: LMP  (LMP Unknown)   Physical Exam She is alert and orient x3 and in no acute distress Ortho Exam She does seem to have positive straight leg raise bilaterally with sciatica bilaterally.  There is subjective numbness in the L4 and L5 bilaterally.  Her right knee shows just a slight effusion with good range of motion. Specialty Comments:  No specialty comments available.  Imaging: No results found.   PMFS History: Patient Active Problem List   Diagnosis Date Noted   Neck pain 11/11/2021   Low back pain 11/11/2021   Unilateral primary osteoarthritis, right knee 11/03/2021   Coronary atherosclerosis 10/06/2021   Dyspnea on exertion 10/06/2021   Polyarthralgia 09/30/2021   Sjogren syndrome, unspecified ([place]) 09/30/2021   Fibromyalgia 09/30/2021  Age-related osteoporosis without current pathological fracture 09/09/2021   Allergic rhinitis 09/09/2021   Anxiety 09/09/2021   Asthma 09/09/2021   Chronic kidney disease 09/09/2021   Chronic pain syndrome 09/09/2021   Colon polyp 09/09/2021   GERD (gastroesophageal reflux disease) 09/09/2021   Hypercalcemia 09/09/2021   Hypertension 09/09/2021   Major depressive disorder, single episode 09/09/2021   Myopathy 09/09/2021   Neuropathy 09/09/2021   Reflux esophagitis 09/09/2021   Sjogren's disease ([place]) 09/09/2021   Mild intermittent asthma without complication 69/48/5462   B12 deficiency  05/06/2020   Colitis 05/06/2020   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[place]) 05/06/2020   Iron deficiency anemia due to chronic blood loss 05/06/2020   Benign essential HTN 08/29/2019   Class 2 obesity due to excess calories without serious comorbidity with body mass index (BMI) of 36.0 to 36.9 in adult 08/29/2019   Dyslipidemia 08/29/2019   Type 2 diabetes mellitus without complication, without long-term current use of insulin ([place]) 08/29/2019   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   Pain in right hand 08/09/2018   Tenosynovitis of left hand 08/09/2018   Spinal stenosis of lumbar region 12/13/2017   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   Past Medical History:  Diagnosis Date   Age-related osteoporosis without current pathological fracture    Allergic rhinitis    Anxiety    Asthma    B12 deficiency 05/06/2020   Benign essential HTN 08/29/2019   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   Chronic kidney disease    Chronic pain syndrome    Class 2 obesity due to excess calories without serious comorbidity with body mass index (BMI) of 36.0 to 36.9 in adult 08/29/2019   Colitis 05/06/2020   Colon polyp    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[place]) 05/06/2020   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   Dyslipidemia 08/29/2019   Fibromyalgia 09/30/2021   GERD (gastroesophageal reflux disease)    Hypercalcemia    Hypertension    Iron deficiency anemia due to chronic blood loss 05/06/2020   Major depressive disorder, single episode    Mild intermittent asthma without complication 70/35/0093   Myopathy    Neuropathy    Pain in right hand 08/09/2018   Polyarthralgia 09/30/2021   Reflux esophagitis    Sjogren syndrome, unspecified ([place]) 09/30/2021   Sjogren's disease ([place])    Spinal stenosis of lumbar region 12/13/2017   Tenosynovitis of left hand 08/09/2018   Type 2 diabetes mellitus without complication, without long-term current use of insulin ([place]) 08/29/2019    Family  History  Problem Relation Age of Onset   Asthma Mother    COPD Mother    Diabetes Mother    Hypertension Mother    Kidney disease Mother    Heart disease Father    Colon polyps Father    Skin cancer Father    Depression Father    Diabetes Father    Colon polyps Sister    Crohn's disease Sister    Diverticulitis Sister    Hypercholesterolemia Sister    Kidney disease Brother    Heart disease Maternal Grandmother    Heart disease Paternal Grandmother    Colon cancer Neg Hx    Rectal cancer Neg Hx    Stomach cancer Neg Hx     Past Surgical History:  Procedure Laterality Date   ABDOMINAL HYSTERECTOMY     CARPAL TUNNEL RELEASE Left 2018   CERVICAL FUSION  10/07/2019   Anterior Cervical Discectomy due to spinal stenosis   CESAREAN  SECTION     COLONOSCOPY     UPPER GASTROINTESTINAL ENDOSCOPY     Social History   Occupational History   Not on file  Tobacco Use   Smoking status: Never   Smokeless tobacco: Never  Vaping Use   Vaping Use: Never used  Substance and Sexual Activity   Alcohol use: No    Alcohol/week: 0.0 standard drinks of alcohol   Drug use: No   Sexual activity: Not on file

## 2022-03-08 ENCOUNTER — Other Ambulatory Visit: Payer: Self-pay

## 2022-03-08 DIAGNOSIS — G8929 Other chronic pain: Secondary | ICD-10-CM

## 2022-03-09 ENCOUNTER — Encounter: Payer: 59 | Admitting: Physical Therapy

## 2022-03-16 ENCOUNTER — Encounter: Payer: 59 | Admitting: Physical Therapy

## 2022-03-23 ENCOUNTER — Encounter: Payer: 59 | Admitting: Physical Therapy

## 2022-03-25 ENCOUNTER — Encounter: Payer: Self-pay | Admitting: Pulmonary Disease

## 2022-03-25 ENCOUNTER — Ambulatory Visit: Payer: 59 | Admitting: Pulmonary Disease

## 2022-03-25 VITALS — BP 124/76 | HR 80 | Temp 98.4°F | Ht 59.0 in | Wt 173.6 lb

## 2022-03-25 DIAGNOSIS — J4520 Mild intermittent asthma, uncomplicated: Secondary | ICD-10-CM

## 2022-03-25 LAB — CBC WITH DIFFERENTIAL/PLATELET
Basophils Absolute: 0.1 10*3/uL (ref 0.0–0.1)
Basophils Relative: 0.7 % (ref 0.0–3.0)
Eosinophils Absolute: 0.1 10*3/uL (ref 0.0–0.7)
Eosinophils Relative: 1.7 % (ref 0.0–5.0)
HCT: 47.4 % — ABNORMAL HIGH (ref 36.0–46.0)
Hemoglobin: 15.5 g/dL — ABNORMAL HIGH (ref 12.0–15.0)
Lymphocytes Relative: 19.3 % (ref 12.0–46.0)
Lymphs Abs: 1.6 10*3/uL (ref 0.7–4.0)
MCHC: 32.6 g/dL (ref 30.0–36.0)
MCV: 86.8 fl (ref 78.0–100.0)
Monocytes Absolute: 0.6 10*3/uL (ref 0.1–1.0)
Monocytes Relative: 6.8 % (ref 3.0–12.0)
Neutro Abs: 5.8 10*3/uL (ref 1.4–7.7)
Neutrophils Relative %: 71.5 % (ref 43.0–77.0)
Platelets: 156 10*3/uL (ref 150.0–400.0)
RBC: 5.46 Mil/uL — ABNORMAL HIGH (ref 3.87–5.11)
RDW: 15.9 % — ABNORMAL HIGH (ref 11.5–15.5)
WBC: 8.1 10*3/uL (ref 4.0–10.5)

## 2022-03-25 NOTE — Progress Notes (Signed)
_0  ID: Crystal Marquez, female    DOB: [birth year], 61 y.o.   MRN: [id number]  Chief Complaint  Patient presents with   Follow-up    Pt states her inhaler has been changed from [facility] to Trelegy by Dr Allean Found. Pt states she has to use her rescue inhaler and her nebulizer lately.    Referring provider: Imagene Riches, NP  HPI:   61 y.o. woman whom we are seeing in follow up for dyspnea, wheezing felt to be related to asthma.  ED note 02/01/2022 reviewed.  Returns for scheduled follow-up.  Has had mild improvement in asthmatic symptoms, dyspnea exertion, wheeze with Breo.  Was escalated to Trelegy by her PCP in the interim since last visit.  At that time she was having what sounds like exacerbation of prednisone was prescribed.  She does feel like Trelegy has been beneficial.  Helps with day-to-day symptoms.  However, she continues to need frequent albuterol rescue inhaler.  Using at least 2 times a day.  Usually after exertion with residual or ongoing prolonged shortness of breath and chest tightness.  HPI at initial visit: Patient in normal state of health.  Got COVID October 2022.  Since then she is had intermittent issues with shortness of breath, wheezing.  Occasional cough.  Sometimes worse in the evenings.  Worse with exertion although can occur at rest as well.  The feeling of inability get deep breath, mild chest tightness.  Seems to come and go.  Some days are better than others.  No other seasonal environmental factors he can identify to make things better or worse.  Mild relief with prednisone, albuterol.  However after prednisone symptoms returned.  She reports history of recurrent bronchitis usually at least once a year sometimes twice a year with season changes.  Reviewed CT chest 08/23/2021 reveals clear lungs bilaterally on my review interpretation.  Reviewed echocardiogram 09/02/2021 that shows reduction in EF to 40% as well as grade 2 diastolic dysfunction and dilated left atrium,  moderate MVR, normal RA size and normal RV function and size.   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  Lab Results: Personally reviewed via [place] with mild elevation in eosinophils 200 and 02/2020 CBC    Component Value Date/Time   WBC 10.6 (H) 02/01/2022 1648   RBC 5.54 (H) 02/01/2022 1648   HGB 15.6 (H) 02/01/2022 1648   HCT 49.5 (H) 02/01/2022 1648   PLT 201 02/01/2022 1648   MCV 89.4 02/01/2022 1648  MCH 28.2 02/01/2022 1648   MCHC 31.5 02/01/2022 1648   RDW 13.5 02/01/2022 1648   LYMPHSABS 2.2 02/01/2022 1648   MONOABS 0.6 02/01/2022 1648   EOSABS 0.2 02/01/2022 1648   BASOSABS 0.1 02/01/2022 1648    BMET    Component Value Date/Time   NA 142 02/01/2022 1648   NA 142 11/22/2021 1057   K 4.8 02/01/2022 1648   CL 107 02/01/2022 1648   CO2 27 02/01/2022 1648   GLUCOSE 85 02/01/2022 1648   BUN 17 02/01/2022 1648   BUN 12 11/22/2021 1057   CREATININE 1.23 (H) 02/01/2022 1648   CALCIUM 9.7 02/01/2022 1648   GFRNONAA 51 (L) 02/01/2022 1648    BNP No results found for: "BNP"  ProBNP No results found for: "PROBNP"  Specialty Problems       Pulmonary Problems   Mild intermittent  asthma without complication   Allergic rhinitis   Asthma   Dyspnea on exertion    Allergies  Allergen Reactions   Lisinopril Shortness Of Breath    Bloating     Immunization History  Administered Date(s) Administered   Influenza,inj,quad, With Preservative 06/19/2017   Influenza-Unspecified 06/28/2021   PFIZER(Purple Top)SARS-COV-2 Vaccination 12/19/2019, 01/13/2020    Past Medical History:  Diagnosis Date   Age-related osteoporosis without current pathological fracture    Allergic rhinitis    Anxiety    Asthma    B12 deficiency 05/06/2020   Benign essential HTN 08/29/2019   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   Chronic kidney disease    Chronic pain syndrome    Class 2 obesity due to excess calories without serious comorbidity with body mass index (BMI) of 36.0 to 36.9 in adult 08/29/2019   Colitis 05/06/2020   Colon polyp    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[place]) 05/06/2020   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   Dyslipidemia 08/29/2019   Fibromyalgia 09/30/2021   GERD (gastroesophageal reflux disease)    Hypercalcemia    Hypertension    Iron deficiency anemia due to chronic blood loss 05/06/2020   Major depressive disorder, single episode    Mild intermittent asthma without complication 41/74/0814   Myopathy    Neuropathy    Pain in right hand 08/09/2018   Polyarthralgia 09/30/2021   Reflux esophagitis    Sjogren syndrome, unspecified ([place]) 09/30/2021   Sjogren's disease ([place])    Spinal stenosis of lumbar region 12/13/2017   Tenosynovitis of left hand 08/09/2018   Type 2 diabetes mellitus without complication, without long-term current use of insulin ([place]) 08/29/2019    Tobacco History: Social History   Tobacco Use  Smoking Status Never  Smokeless Tobacco Never   Counseling given: Not Answered   Continue to not smoke  Outpatient Encounter Medications as of 03/25/2022  Medication Sig   albuterol (PROVENTIL) (2.5 MG/3ML) 0.083% nebulizer  solution Take 2.5 mg by nebulization every 6 (six) hours as needed for wheezing or shortness of breath.   albuterol (VENTOLIN HFA) 108 (90 Base) MCG/ACT inhaler Inhale 1 puff into the lungs every 6 (six) hours as needed for wheezing or shortness of breath.   Cholecalciferol (VITAMIN D3) 125 MCG (5000 UT) CAPS Take 1 capsule by mouth daily.   [employer] SENNA 8.6 MG tablet TAKE 1 TABLET (8.6 MG TOTAL) BY MOUTH EVERY OTHER DAY.   Cyanocobalamin (B-12 COMPLIANCE INJECTION) 1000 MCG/ML KIT Inject 1,000 mcg as directed every 30 (thirty) days.   cycloSPORINE (RESTASIS) 0.05 % ophthalmic emulsion Place 1 drop into both eyes 2 (  two) times daily.   DULoxetine (CYMBALTA) 60 MG capsule Take 120 mg by mouth daily.   ferrous sulfate 325 (65 FE) MG tablet Take 325 mg by mouth daily with breakfast.   Fluticasone-Umeclidin-Vilant (TRELEGY ELLIPTA) 200-62.5-25 MCG/ACT AEPB Inhale 1 puff into the lungs daily.   Folic Acid 0.8 MG CAPS Take 2 capsules by mouth daily.   hydrocortisone (ANUSOL-HC) 2.5 % rectal cream Place 1 application rectally daily.   hydroxychloroquine (PLAQUENIL) 200 MG tablet Take 400 mg by mouth daily.   ipratropium-albuterol (DUONEB) 0.5-2.5 (3) MG/3ML SOLN Take 3 mLs by nebulization daily as needed (shortness of breath).   JARDIANCE 25 MG TABS tablet Take 25 mg by mouth daily.   metFORMIN (GLUCOPHAGE) 500 MG tablet Take 500 mg by mouth 2 (two) times daily with a meal.   methocarbamol (ROBAXIN) 500 MG tablet Take 1 tablet (500 mg total) by mouth 2 (two) times daily.   montelukast (SINGULAIR) 10 MG tablet Take 10 mg by mouth at bedtime.   mupirocin ointment (BACTROBAN) 2 % Apply 1 application topically 2 (two) times daily.   NYSTATIN powder Apply 1 application topically 3 (three) times daily.   pantoprazole (PROTONIX) 40 MG tablet Take 40 mg by mouth daily.   polyethylene glycol (MIRALAX / GLYCOLAX) 17 g packet Take 17 g by mouth every other day.   potassium chloride SA (K-DUR,KLOR-CON) 20 MEQ tablet  Take 20 mEq by mouth daily.   promethazine (PHENERGAN) 12.5 MG tablet Take 12.5 mg by mouth every 6 (six) hours as needed for nausea or vomiting.   rosuvastatin (CRESTOR) 5 MG tablet Take 5 mg by mouth daily.   sacubitril-valsartan (ENTRESTO) 24-26 MG Take 1 tablet by mouth 2 (two) times daily.   Magnesium 400 MG TABS Take 400 mg by mouth 2 (two) times daily. (Patient not taking: Reported on 03/25/2022)   [DISCONTINUED] cetirizine (ZYRTEC) 10 MG tablet Take 10 mg by mouth at bedtime.   [DISCONTINUED] fluticasone furoate-vilanterol (BREO ELLIPTA) 200-25 MCG/ACT AEPB Inhale 1 puff into the lungs daily.   No facility-administered encounter medications on file as of 03/25/2022.     Review of Systems  Review of Systems  N/a Physical Exam  BP 124/76 (BP Location: Right Arm, Patient Position: Sitting, Cuff Size: Normal)   Pulse 80   Temp 98.4 F (36.9 C) (Oral)   Ht _0  (1.499 m)   Wt 173 lb 9.6 oz (78.7 kg)   LMP  (LMP Unknown)   SpO2 99%   BMI 35.06 kg/m   Wt Readings from Last 5 Encounters:  03/25/22 173 lb 9.6 oz (78.7 kg)  02/01/22 170 lb (77.1 kg)  12/20/21 182 lb 9.6 oz (82.8 kg)  11/11/21 176 lb (79.8 kg)  11/08/21 179 lb 9.6 oz (81.5 kg)    BMI Readings from Last 5 Encounters:  03/25/22 35.06 kg/m  02/01/22 34.34 kg/m  12/20/21 36.88 kg/m  11/11/21 36.16 kg/m  11/08/21 36.27 kg/m     Physical Exam General: Well-appearing, sitting in chair Eyes: EOMI, icterus Neck: Supple, no JVP Pulmonary: Clear, no wheeze Cardiovascular: Regular in rhythm, no murmur Abdomen: Nondistended, bowel sounds present MSK: No synovitis, no joint effusion Neuro: Normal gait, no weakness Psych: Normal mood, full affect   Assessment & Plan:   Dyspnea on exertion: Likely multifactorial.  Contribution of cardiac causes given her reduced EF to 40% as well as left atrial dilation and MVR.  Fortunately right side looks reassuring in terms of pulmonary hypertension.  Additionally,  high suspicion for  poorly controlled asthma especially with exacerbation or worsening since COVID infection 06/2021.  Improved overall with Breo.  Further improvement with Trelegy high-dose, to continue.  See below for further treatment of asthma.  Moderate to severe persistent asthma: Previous diagnosis.  Suspicious for uncontrolled asthma given worsening in symptoms after COVID infection, post viral.  Continue high-dose Trelegy.  Still with albuterol use at least twice daily.  Last for phenotyping today.  Based on results, will decide on biologic therapy, will start process next week.  Rationale for biologic therapy includes uncontrolled symptoms of frequent albuterol use and recurrent exacerbations requiring prednisone.  Return in about 3 months (around 06/25/2022).   Lanier Clam, MD 03/25/2022

## 2022-03-25 NOTE — Patient Instructions (Signed)
Nice to see you again  I think it is worth investigating additional medicines to treat your asthma.  The goal would be that you have minimal to no albuterol use and no prednisone use for a period of time.  I think additional medications will help with this.  These are injections.  Main risk is anaphylaxis or severe allergic reaction.  You are required to have an epinephrine pen with these medications.  We will get lab work today to see which medication will be best for you.  We will be in contact in the coming days about next steps.  We should be able to start the medication before your next follow-up visit.  The first visit will be in the office with our pharmacist.  We watched you for a couple of hours after the first dose to make sure everything goes well.  Bring a book or something to do during the time in the office and a phone charger with you when that appointment is scheduled.  Return to clinic in 3 months or sooner as needed with Dr. Silas Flood

## 2022-03-28 LAB — ALLERGEN PROFILE, PERENNIAL ALLERGEN IGE
Alternaria Alternata IgE: <0.1 kU/L
Aspergillus Fumigatus IgE: <0.1 kU/L
Aureobasidi Pullulans IgE: <0.1 kU/L
Candida Albicans IgE: 0.16 kU/L — AB
Cat Dander IgE: <0.1 kU/L
Chicken Feathers IgE: <0.1 kU/L
Cladosporium Herbarum IgE: <0.1 kU/L
Cow Dander IgE: <0.1 kU/L
D Farinae IgE: <0.1 kU/L
D Pteronyssinus IgE: <0.1 kU/L
Dog Dander IgE: <0.1 kU/L
Duck Feathers IgE: <0.1 kU/L
Goose Feathers IgE: <0.1 kU/L
Mouse Urine IgE: <0.1 kU/L
Mucor Racemosus IgE: <0.1 kU/L
Penicillium Chrysogen IgE: <0.1 kU/L
Phoma Betae IgE: <0.1 kU/L
Setomelanomma Rostrat: <0.1 kU/L
Stemphylium Herbarum IgE: <0.1 kU/L

## 2022-03-28 LAB — IGE: IgE (Immunoglobulin E), Serum: 18 kU/L (ref <=114)

## 2022-03-30 ENCOUNTER — Encounter: Payer: 59 | Admitting: Physical Therapy

## 2022-04-01 ENCOUNTER — Other Ambulatory Visit: Payer: Self-pay

## 2022-04-03 ENCOUNTER — Ambulatory Visit (HOSPITAL_COMMUNITY)
Admission: RE | Admit: 2022-04-03 | Discharge: 2022-04-03 | Disposition: A | Discharge status: Home / Self Care | Payer: 59 | Source: Ambulatory Visit | Attending: Orthopaedic Surgery | Admitting: Orthopaedic Surgery

## 2022-04-03 DIAGNOSIS — M5442 Lumbago with sciatica, left side: Secondary | ICD-10-CM | POA: Diagnosis present

## 2022-04-03 DIAGNOSIS — M5441 Lumbago with sciatica, right side: Secondary | ICD-10-CM | POA: Insufficient documentation

## 2022-04-03 DIAGNOSIS — G8929 Other chronic pain: Secondary | ICD-10-CM | POA: Insufficient documentation

## 2022-04-05 ENCOUNTER — Ambulatory Visit: Payer: 59 | Admitting: Cardiology

## 2022-04-05 ENCOUNTER — Encounter: Payer: Self-pay | Admitting: Cardiology

## 2022-04-05 VITALS — BP 130/80 | HR 82 | Ht 59.0 in | Wt 174.0 lb

## 2022-04-05 DIAGNOSIS — I2510 Atherosclerotic heart disease of native coronary artery without angina pectoris: Secondary | ICD-10-CM | POA: Diagnosis not present

## 2022-04-05 DIAGNOSIS — I10 Essential (primary) hypertension: Secondary | ICD-10-CM | POA: Diagnosis not present

## 2022-04-05 DIAGNOSIS — E119 Type 2 diabetes mellitus without complications: Secondary | ICD-10-CM

## 2022-04-05 DIAGNOSIS — E782 Mixed hyperlipidemia: Secondary | ICD-10-CM

## 2022-04-05 DIAGNOSIS — E6609 Other obesity due to excess calories: Secondary | ICD-10-CM

## 2022-04-05 DIAGNOSIS — Z6836 Body mass index (BMI) 36.0-36.9, adult: Secondary | ICD-10-CM

## 2022-04-05 NOTE — Patient Instructions (Signed)
Medication Instructions:  Your physician recommends that you continue on your current medications as directed. Please refer to the Current Medication list given to you today.  *If you need a refill on your cardiac medications before your next appointment, please call your pharmacy*   Lab Work: Your physician recommends that you have a BMET.  If you have labs (blood work) drawn today and your tests are completely normal, you will receive your results only by: [place] (if you have MyChart) OR A paper copy in the mail If you have any lab test that is abnormal or we need to change your treatment, we will call you to review the results.   Testing/Procedures: Your physician has requested that you have an echocardiogram. Echocardiography is a painless test that uses sound waves to create images of your heart. It provides your doctor with information about the size and shape of your heart and how well your heart's chambers and valves are working. This procedure takes approximately one hour. There are no restrictions for this procedure.    Follow-Up: At [facility], you and your health needs are our priority.  As part of our continuing mission to provide you with exceptional heart care, we have created designated Provider Care Teams.  These Care Teams include your primary Cardiologist (physician) and Advanced Practice Providers (APPs -  Physician Assistants and Nurse Practitioners) who all work together to provide you with the care you need, when you need it.  We recommend signing up for the patient portal called "MyChart".  Sign up information is provided on this After Visit Summary.  MyChart is used to connect with patients for Virtual Visits (Telemedicine).  Patients are able to view lab/test results, encounter notes, upcoming appointments, etc.  Non-urgent messages can be sent to your provider as well.   To learn more about what you can do with MyChart, go to [email or web address].     Your next appointment:   6 month(s)  The format for your next appointment:   In Person  Provider:   Jyl Heinz, MD   Other Instructions Echocardiogram An echocardiogram is a test that uses sound waves (ultrasound) to produce images of the heart. Images from an echocardiogram can provide important information about: Heart size and shape. The size and thickness and movement of your heart's walls. Heart muscle function and strength. Heart valve function or if you have stenosis. Stenosis is when the heart valves are too narrow. If blood is flowing backward through the heart valves (regurgitation). A tumor or infectious growth around the heart valves. Areas of heart muscle that are not working well because of poor blood flow or injury from a heart attack. Aneurysm detection. An aneurysm is a weak or damaged part of an artery wall. The wall bulges out from the normal force of blood pumping through the body. Tell a health care provider about: Any allergies you have. All medicines you are taking, including vitamins, herbs, eye drops, creams, and over-the-counter medicines. Any blood disorders you have. Any surgeries you have had. Any medical conditions you have. Whether you are pregnant or may be pregnant. What are the risks? Generally, this is a safe test. However, problems may occur, including an allergic reaction to dye (contrast) that may be used during the test. What happens before the test? No specific preparation is needed. You may eat and drink normally. What happens during the test? You will take off your clothes from the waist up and put on a hospital gown. Electrodes  or electrocardiogram (ECG)patches may be placed on your chest. The electrodes or patches are then connected to a device that monitors your heart rate and rhythm. You will lie down on a table for an ultrasound exam. A gel will be applied to your chest to help sound waves pass through your skin. A handheld  device, called a transducer, will be pressed against your chest and moved over your heart. The transducer produces sound waves that travel to your heart and bounce back (or "echo" back) to the transducer. These sound waves will be captured in real-time and changed into images of your heart that can be viewed on a video monitor. The images will be recorded on a computer and reviewed by your health care provider. You may be asked to change positions or hold your breath for a short time. This makes it easier to get different views or better views of your heart. In some cases, you may receive contrast through an IV in one of your veins. This can improve the quality of the pictures from your heart. The procedure may vary among health care providers and hospitals.   What can I expect after the test? You may return to your normal, everyday life, including diet, activities, and medicines, unless your health care provider tells you not to do that. Follow these instructions at home: It is up to you to get the results of your test. Ask your health care provider, or the department that is doing the test, when your results will be ready. Keep all follow-up visits. This is important. Summary An echocardiogram is a test that uses sound waves (ultrasound) to produce images of the heart. Images from an echocardiogram can provide important information about the size and shape of your heart, heart muscle function, heart valve function, and other possible heart problems. You do not need to do anything to prepare before this test. You may eat and drink normally. After the echocardiogram is completed, you may return to your normal, everyday life, unless your health care provider tells you not to do that. This information is not intended to replace advice given to you by your health care provider. Make sure you discuss any questions you have with your health care provider. Document Revised: 04/28/2020 Document Reviewed:  04/28/2020 Elsevier Patient Education  2021 [employer].

## 2022-04-05 NOTE — Progress Notes (Signed)
Cardiology Office Note:    Date:  04/05/2022   ID:  Crystal Marquez, DOB 1960-10-09, MRN [id number]  PCP:  Imagene Riches, NP  Cardiologist:  Jenean Lindau, MD   Referring MD: Imagene Riches, NP    ASSESSMENT:    1. Benign essential HTN   2. Atherosclerosis of native coronary artery of native heart without angina pectoris   3. Class 2 obesity due to excess calories without serious comorbidity with body mass index (BMI) of 36.0 to 36.9 in adult   4. Type 2 diabetes mellitus without complication, without long-term current use of insulin ([place])   5. Mixed dyslipidemia    PLAN:    In order of problems listed above:  Coronary artery disease: Secondary prevention stressed with the patient.  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  She is on guideline directed medical therapy and we will get an echocardiogram for follow-up.  She was advised to walk at least half an hour a day 5 days a week and she promises to do so. Essential hypertension: Blood pressure stable and diet was emphasized. Mixed dyslipidemia and diabetes mellitus and obesity: Lifestyle modification urged diet was emphasized.  She promises to do better.  Risks of obesity explained. Patient will be seen in follow-up appointment in 6 months or earlier if the patient has any concerns She is on multiple medications and will have a Chem-7 today.   Medication Adjustments/Labs and Tests Ordered: Current medicines are reviewed at length with the patient today.  Concerns regarding medicines are outlined above.  No orders of the defined types were placed in this encounter.  No orders of the defined types were placed in this encounter.    Chief Complaint  Patient presents with   Follow-up     History of Present Illness:    Crystal Marquez is a 61 y.o. female.  Patient has past medical history of coronary artery calcifications, essential  hypertension, dyslipidemia, diabetes mellitus, obesity and cardiomyopathy.  She is on guideline directed medical therapy.  She denies any problems at this time and takes care of activities of daily living.  No chest pain orthopnea or PND.  She tries to exercise to the best of her ability.  At the time of my evaluation, the patient is alert awake oriented and in no distress.  Past Medical History:  Diagnosis Date   Age-related osteoporosis without current pathological fracture    Allergic rhinitis    Anxiety    Asthma    B12 deficiency 05/06/2020   Benign essential HTN 08/29/2019   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   Chronic kidney disease    Chronic pain syndrome    Class 2 obesity due to excess calories without serious comorbidity with body mass index (BMI) of 36.0 to 36.9 in adult 08/29/2019   Colitis 05/06/2020   Colon polyp    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[place]) 05/06/2020   Coronary atherosclerosis 10/06/2021   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   Dyslipidemia 08/29/2019   Dyspnea on exertion 10/06/2021   Fibromyalgia 09/30/2021   GERD (gastroesophageal reflux disease)    Hypercalcemia    Hypertension    Iron deficiency anemia due to chronic blood loss 05/06/2020   Low back pain 11/11/2021   Major depressive disorder, single episode    Mild intermittent asthma without complication 07/13/8526   Myopathy    Neck pain 11/11/2021   Neuropathy    Pain in  right hand 08/09/2018   Polyarthralgia 09/30/2021   Reflux esophagitis    Sjogren syndrome, unspecified ([place]) 09/30/2021   Sjogren's disease ([place])    Spinal stenosis of lumbar region 12/13/2017   Tenosynovitis of left hand 08/09/2018   Type 2 diabetes mellitus without complication, without long-term current use of insulin ([place]) 08/29/2019   Unilateral primary osteoarthritis, right knee 11/03/2021    Past Surgical History:  Procedure Laterality Date   ABDOMINAL HYSTERECTOMY     CARPAL TUNNEL RELEASE Left 2018    CERVICAL FUSION  10/07/2019   Anterior Cervical Discectomy due to spinal stenosis   CESAREAN SECTION     COLONOSCOPY     UPPER GASTROINTESTINAL ENDOSCOPY      Current Medications: Current Meds  Medication Sig   albuterol (PROVENTIL) (2.5 MG/3ML) 0.083% nebulizer solution Take 2.5 mg by nebulization every 6 (six) hours as needed for wheezing or shortness of breath.   albuterol (VENTOLIN HFA) 108 (90 Base) MCG/ACT inhaler Inhale 1 puff into the lungs every 6 (six) hours as needed for wheezing or shortness of breath.   ALPRAZolam (XANAX) 0.5 MG tablet Take 0.5 mg by mouth 3 (three) times daily as needed for anxiety.   Cholecalciferol (VITAMIN D3) 125 MCG (5000 UT) CAPS Take 1 capsule by mouth daily.   [employer] SENNA 8.6 MG tablet TAKE 1 TABLET (8.6 MG TOTAL) BY MOUTH EVERY OTHER DAY.   Cyanocobalamin (B-12 COMPLIANCE INJECTION) 1000 MCG/ML KIT Inject 1,000 mcg as directed every 30 (thirty) days.   cycloSPORINE (RESTASIS) 0.05 % ophthalmic emulsion Place 1 drop into both eyes 2 (two) times daily.   DULoxetine (CYMBALTA) 60 MG capsule Take 120 mg by mouth daily.   ferrous sulfate 325 (65 FE) MG tablet Take 325 mg by mouth daily with breakfast.   fexofenadine (ALLEGRA) 180 MG tablet Take 180 mg by mouth daily.   Fluticasone-Umeclidin-Vilant (TRELEGY ELLIPTA) 200-62.5-25 MCG/ACT AEPB Inhale 1 puff into the lungs daily.   Folic Acid 0.8 MG CAPS Take 2 capsules by mouth daily.   hydrocortisone (ANUSOL-HC) 2.5 % rectal cream Place 1 application rectally daily.   hydroxychloroquine (PLAQUENIL) 200 MG tablet Take 400 mg by mouth daily.   ipratropium-albuterol (DUONEB) 0.5-2.5 (3) MG/3ML SOLN Take 3 mLs by nebulization daily as needed (shortness of breath).   JARDIANCE 25 MG TABS tablet Take 25 mg by mouth daily.   metFORMIN (GLUCOPHAGE) 500 MG tablet Take 500 mg by mouth 2 (two) times daily with a meal.   montelukast (SINGULAIR) 10 MG tablet Take 10 mg by mouth at bedtime.   mupirocin ointment  (BACTROBAN) 2 % Apply 1 application topically 2 (two) times daily.   NYSTATIN powder Apply 1 application topically 3 (three) times daily.   pantoprazole (PROTONIX) 40 MG tablet Take 40 mg by mouth daily.   polyethylene glycol (MIRALAX / GLYCOLAX) 17 g packet Take 17 g by mouth every other day.   potassium chloride SA (K-DUR,KLOR-CON) 20 MEQ tablet Take 20 mEq by mouth daily.   pregabalin (LYRICA) 150 MG capsule Take 150 mg by mouth 3 (three) times daily.   promethazine (PHENERGAN) 12.5 MG tablet Take 12.5 mg by mouth every 6 (six) hours as needed for nausea or vomiting.   rosuvastatin (CRESTOR) 5 MG tablet Take 5 mg by mouth daily.   sacubitril-valsartan (ENTRESTO) 24-26 MG Take 1 tablet by mouth 2 (two) times daily.     Allergies:   Lisinopril   Social History   Socioeconomic History   Marital status: Married  Spouse name: Not on file   Number of children: Not on file   Years of education: Not on file   Highest education level: Not on file  Occupational History   Not on file  Tobacco Use   Smoking status: Never   Smokeless tobacco: Never  Vaping Use   Vaping Use: Never used  Substance and Sexual Activity   Alcohol use: No    Alcohol/week: 0.0 standard drinks of alcohol   Drug use: No   Sexual activity: Not on file  Other Topics Concern   Not on file  Social History Narrative   Not on file   Social Determinants of Health   Financial Resource Strain: Not on file  Food Insecurity: Not on file  Transportation Needs: Not on file  Physical Activity: Not on file  Stress: Not on file  Social Connections: Not on file     Family History: The patient's family history includes Asthma in her mother; COPD in her mother; Colon polyps in her father and sister; Crohn's disease in her sister; Depression in her father; Diabetes in her father and mother; Diverticulitis in her sister; Heart disease in her father, maternal grandmother, and paternal grandmother; Hypercholesterolemia in  her sister; Hypertension in her mother; Kidney disease in her brother and mother; Skin cancer in her father. There is no history of Colon cancer, Rectal cancer, or Stomach cancer.  ROS:   Please see the history of present illness.    All other systems reviewed and are negative.  EKGs/Labs/Other Studies Reviewed:    The following studies were reviewed today: I discussed my findings with the patient at length.   Recent Labs: 02/01/2022: ALT 22; BUN 17; Creatinine, Ser 1.23; Potassium 4.8; Sodium 142 03/25/2022: Hemoglobin 15.5; Platelets 156.0  Recent Lipid Panel No results found for: "CHOL", "TRIG", "HDL", "CHOLHDL", "VLDL", "LDLCALC", "LDLDIRECT"  Physical Exam:    VS:  BP 130/80 (BP Location: Right Arm, Patient Position: Sitting, Cuff Size: Normal)   Pulse 82   Ht 4' 11"  (1.499 m)   Wt 174 lb (78.9 kg)   LMP  (LMP Unknown)   SpO2 96%   BMI 35.14 kg/m     Wt Readings from Last 3 Encounters:  04/05/22 174 lb (78.9 kg)  03/25/22 173 lb 9.6 oz (78.7 kg)  02/01/22 170 lb (77.1 kg)     GEN: Patient is in no acute distress HEENT: Normal NECK: No JVD; No carotid bruits LYMPHATICS: No lymphadenopathy CARDIAC: Hear sounds regular, 2/6 systolic murmur at the apex. RESPIRATORY:  Clear to auscultation without rales, wheezing or rhonchi  ABDOMEN: Soft, non-tender, non-distended MUSCULOSKELETAL:  No edema; No deformity  SKIN: Warm and dry NEUROLOGIC:  Alert and oriented x 3 PSYCHIATRIC:  Normal affect   Signed, Jenean Lindau, MD  04/05/2022 9:26 AM    [place]

## 2022-04-06 ENCOUNTER — Ambulatory Visit: Payer: 59 | Admitting: Orthopaedic Surgery

## 2022-04-06 ENCOUNTER — Encounter: Payer: Self-pay | Admitting: Orthopaedic Surgery

## 2022-04-06 DIAGNOSIS — M5442 Lumbago with sciatica, left side: Secondary | ICD-10-CM

## 2022-04-06 DIAGNOSIS — M5441 Lumbago with sciatica, right side: Secondary | ICD-10-CM | POA: Diagnosis not present

## 2022-04-06 DIAGNOSIS — G8929 Other chronic pain: Secondary | ICD-10-CM

## 2022-04-06 LAB — BASIC METABOLIC PANEL
BUN/Creatinine Ratio: 16 (ref 9–23)
BUN: 18 mg/dL (ref 6–24)
CO2: 24 mmol/L (ref 20–29)
Calcium: 10 mg/dL (ref 8.7–10.2)
Chloride: 105 mmol/L (ref 96–106)
Creatinine, Ser: 1.1 mg/dL — ABNORMAL HIGH (ref 0.57–1.00)
Glucose: 145 mg/dL — ABNORMAL HIGH (ref 70–99)
Potassium: 4.8 mmol/L (ref 3.5–5.2)
Sodium: 144 mmol/L (ref 134–144)
eGFR: 58 mL/min/{1.73_m2} — ABNORMAL LOW (ref >59)

## 2022-04-06 NOTE — Addendum Note (Signed)
Addended by: Robyne Peers on: 04/06/2022 03:16 PM   Modules accepted: Orders

## 2022-04-06 NOTE — Progress Notes (Signed)
The patient comes in today to go over a MRI of her lumbar spine.  She still reports significant low back pain with radicular symptoms with most of her pain seems to be in the lower aspect of her lumbar spine.  On my exam today it is at the midline and into the paraspinal muscles at the lowest aspect of the lumbar spine.  She has excellent strength in both her lower extremities today.  She has chronic numbness in her right foot.  She is a diabetic but reports good control.  She has remote history of cervical spine surgery which she said is apparently failing in terms of the screw that is loose.  Her original surgery of her neck I believe was done in [place].  The MRI of her lumbar spine was reviewed and it does show degenerative changes at several levels but no significant stenosis.  She does have significant facet joint degenerative changes at L5-S1 and this seems to correlate with where she is experiencing the most pain on my exam.  I would like to send her to Dr. Ernestina Patches for bilateral L5-S1 facet injections.  She agrees with this treatment plan.  I will see her back in follow-up.  All question concerns were answered and addressed.  Of note she has been through physical therapy as well.

## 2022-04-07 ENCOUNTER — Ambulatory Visit (INDEPENDENT_AMBULATORY_CARE_PROVIDER_SITE_OTHER): Payer: 59

## 2022-04-07 DIAGNOSIS — I2510 Atherosclerotic heart disease of native coronary artery without angina pectoris: Secondary | ICD-10-CM | POA: Diagnosis not present

## 2022-04-07 LAB — ECHOCARDIOGRAM COMPLETE
Area-P 1/2: 4.06 cm2
MV M vel: 5.23 m/s
MV Peak grad: 109.4 mmHg
P 1/2 time: 457 msec
Radius: 0.3 cm
S' Lateral: 2.5 cm

## 2022-04-11 ENCOUNTER — Telehealth: Payer: Self-pay | Admitting: Pulmonary Disease

## 2022-04-11 NOTE — Telephone Encounter (Signed)
Called patient and she is wanting to know the results of her lab work that was done earlier this month.   Please advise sir

## 2022-04-13 NOTE — Telephone Encounter (Signed)
Labs were re-assuring. No elevation in IgE or eosinophils. This is helpful to know because it helps [place] choose a biologic to see if it will better control asthma. Based on results, the injectable or biologic medication I would recommend is Tezspire. I will start the process of obtaining this.

## 2022-04-13 NOTE — Telephone Encounter (Signed)
Called patient and left voicemail for her to call office back in regards to lab work results.    She will need paperwork for the Tezspire.

## 2022-04-14 NOTE — Telephone Encounter (Signed)
Patient called office back and went over results from [facility]. Patient agreed to [facility]. Sent paperwork to patient in mail. Nothing further needed

## 2022-05-04 DIAGNOSIS — Z0182 Encounter for allergy testing: Secondary | ICD-10-CM | POA: Insufficient documentation

## 2022-05-04 DIAGNOSIS — R051 Acute cough: Secondary | ICD-10-CM | POA: Insufficient documentation

## 2022-05-09 ENCOUNTER — Ambulatory Visit: Payer: 59 | Admitting: Physical Medicine and Rehabilitation

## 2022-05-09 ENCOUNTER — Encounter: Payer: Self-pay | Admitting: Physical Medicine and Rehabilitation

## 2022-05-09 ENCOUNTER — Ambulatory Visit: Payer: Self-pay

## 2022-05-09 VITALS — BP 142/80 | HR 94

## 2022-05-09 DIAGNOSIS — M47816 Spondylosis without myelopathy or radiculopathy, lumbar region: Secondary | ICD-10-CM

## 2022-05-09 MED ORDER — METHYLPREDNISOLONE ACETATE 80 MG/ML IJ SUSP
80.0000 mg | Freq: Once | INTRAMUSCULAR | Status: AC
  Administered 2022-05-09: 80 mg

## 2022-05-09 NOTE — Progress Notes (Signed)
Pt state lower back pain that travels down her right leg. Pt state walking, standing and laying down makes the pain worse. Pt state she takes over the counter pain meds and uses a massager to help ease her pain.  Numeric Pain Rating Scale and Functional Assessment Average Pain 5   In the last MONTH (on 0-10 scale) has pain interfered with the following?  1. General activity like being  able to carry out your everyday physical activities such as walking, climbing stairs, carrying groceries, or moving a chair?  Rating(8)   +Driver, -BT, -Dye Allergies.

## 2022-05-09 NOTE — Patient Instructions (Signed)

## 2022-05-18 ENCOUNTER — Ambulatory Visit: Payer: 59 | Admitting: Orthopaedic Surgery

## 2022-05-19 NOTE — Progress Notes (Signed)
[name] - 61 y.o. female MRN [id number]  Date of birth: 1961-02-11  Office Visit Note: Visit Date: 05/09/2022 PCP: Rhea Bleacher, NP Referred by: Rhea Bleacher, NP  Subjective: Chief Complaint  Patient presents with   Lower Back - Pain   Right Leg - Pain   HPI:  Crystal Marquez is a 61 y.o. female who comes in today at the request of Dr. Jean Rosenthal for planned Bilateral  L5-S1 Lumbar facet/medial branch block with fluoroscopic guidance.  The patient has failed conservative care including home exercise, medications, time and activity modification.  This injection will be diagnostic and hopefully therapeutic.  Please see requesting physician notes for further details and justification.  Exam has shown concordant pain with facet joint loading.   ROS Otherwise per HPI.  Assessment & Plan: Visit Diagnoses:    ICD-10-CM   1. Spondylosis without myelopathy or radiculopathy, lumbar region  M47.816 XR C-ARM NO REPORT    Facet Injection    methylPREDNISolone acetate (DEPO-MEDROL) injection 80 mg      Plan: No additional findings.   Meds & Orders:  Meds ordered this encounter  Medications   methylPREDNISolone acetate (DEPO-MEDROL) injection 80 mg    Orders Placed This Encounter  Procedures   Facet Injection   XR C-ARM NO REPORT    Follow-up: Return for visit to requesting provider as needed.   Procedures: No procedures performed  Lumbar Facet Joint Intra-Articular Injection(s) with Fluoroscopic Guidance  Patient: Crystal Marquez      Date of Birth: [birth year] MRN: [id number] PCP: Rhea Bleacher, NP      Visit Date: 05/09/2022   Universal Protocol:    Date/Time: 05/09/2022  Consent Given By: the patient  Position: PRONE   Additional Comments: Vital signs were monitored before and after the procedure. Patient was prepped and draped in the usual sterile fashion. The correct patient, procedure, and site was verified.   Injection Procedure Details:  Procedure  Site One Meds Administered:  Meds ordered this encounter  Medications   methylPREDNISolone acetate (DEPO-MEDROL) injection 80 mg     Laterality: Bilateral  Location/Site:  L5-S1  Needle size: 22 guage  Needle type: Spinal  Needle Placement: Articular  Findings:  -Comments: Excellent flow of contrast producing a partial arthrogram.  Procedure Details: The fluoroscope beam is vertically oriented in AP, and the inferior recess is visualized beneath the lower pole of the inferior apophyseal process, which represents the target point for needle insertion. When direct visualization is difficult the target point is located at the medial projection of the vertebral pedicle. The region overlying each aforementioned target is locally anesthetized with a 1 to 2 ml. volume of 1% Lidocaine without Epinephrine.   The spinal needle was inserted into each of the above mentioned facet joints using biplanar fluoroscopic guidance. A 0.25 to 0.5 ml. volume of Isovue-250 was injected and a partial facet joint arthrogram was obtained. A single spot film was obtained of the resulting arthrogram.    One to 1.25 ml of the steroid/anesthetic solution was then injected into each of the facet joints noted above.   Additional Comments:  The patient tolerated the procedure well Dressing: 2 x 2 sterile gauze and Band-Aid    Post-procedure details: Patient was observed during the procedure. Post-procedure instructions were reviewed.  Patient left the clinic in stable condition.    Clinical History: No specialty comments available.     Objective:  VS:  HT:    WT:   BMI:  BP:(!) 142/80  HR:94bpm  TEMP: ( )  RESP:  Physical Exam Vitals and nursing note reviewed.  Constitutional:      General: She is not in acute distress.    Appearance: Normal appearance. She is not ill-appearing.  HENT:     Head: Normocephalic and atraumatic.     Right Ear: External ear normal.     Left Ear: External ear  normal.  Eyes:     Extraocular Movements: Extraocular movements intact.  Cardiovascular:     Rate and Rhythm: Normal rate.     Pulses: Normal pulses.  Pulmonary:     Effort: Pulmonary effort is normal. No respiratory distress.  Abdominal:     General: There is no distension.     Palpations: Abdomen is soft.  Musculoskeletal:        General: Tenderness present.     Cervical back: Neck supple.     Right lower leg: No edema.     Left lower leg: No edema.     Comments: Patient has good distal strength with no pain over the greater trochanters.  No clonus or focal weakness.  Skin:    Findings: No erythema, lesion or rash.  Neurological:     General: No focal deficit present.     Mental Status: She is alert and oriented to person, place, and time.     Sensory: No sensory deficit.     Motor: No weakness or abnormal muscle tone.     Coordination: Coordination normal.  Psychiatric:        Mood and Affect: Mood normal.        Behavior: Behavior normal.      Imaging: No results found.

## 2022-05-19 NOTE — Procedures (Signed)
Lumbar Facet Joint Intra-Articular Injection(s) with Fluoroscopic Guidance  Patient: Crystal Marquez      Date of Birth: 1961/08/09 MRN: [id number] PCP: Rhea Bleacher, NP      Visit Date: 05/09/2022   Universal Protocol:    Date/Time: 05/09/2022  Consent Given By: the patient  Position: PRONE   Additional Comments: Vital signs were monitored before and after the procedure. Patient was prepped and draped in the usual sterile fashion. The correct patient, procedure, and site was verified.   Injection Procedure Details:  Procedure Site One Meds Administered:  Meds ordered this encounter  Medications   methylPREDNISolone acetate (DEPO-MEDROL) injection 80 mg     Laterality: Bilateral  Location/Site:  L5-S1  Needle size: 22 guage  Needle type: Spinal  Needle Placement: Articular  Findings:  -Comments: Excellent flow of contrast producing a partial arthrogram.  Procedure Details: The fluoroscope beam is vertically oriented in AP, and the inferior recess is visualized beneath the lower pole of the inferior apophyseal process, which represents the target point for needle insertion. When direct visualization is difficult the target point is located at the medial projection of the vertebral pedicle. The region overlying each aforementioned target is locally anesthetized with a 1 to 2 ml. volume of 1% Lidocaine without Epinephrine.   The spinal needle was inserted into each of the above mentioned facet joints using biplanar fluoroscopic guidance. A 0.25 to 0.5 ml. volume of Isovue-250 was injected and a partial facet joint arthrogram was obtained. A single spot film was obtained of the resulting arthrogram.    One to 1.25 ml of the steroid/anesthetic solution was then injected into each of the facet joints noted above.   Additional Comments:  The patient tolerated the procedure well Dressing: 2 x 2 sterile gauze and Band-Aid    Post-procedure details: Patient was observed  during the procedure. Post-procedure instructions were reviewed.  Patient left the clinic in stable condition.

## 2022-06-13 ENCOUNTER — Telehealth: Payer: Self-pay | Admitting: Pulmonary Disease

## 2022-06-13 ENCOUNTER — Encounter: Payer: Self-pay | Admitting: Physician Assistant

## 2022-06-13 ENCOUNTER — Ambulatory Visit: Payer: Commercial Managed Care - HMO | Admitting: Physician Assistant

## 2022-06-13 DIAGNOSIS — M542 Cervicalgia: Secondary | ICD-10-CM

## 2022-06-13 DIAGNOSIS — M5441 Lumbago with sciatica, right side: Secondary | ICD-10-CM | POA: Diagnosis not present

## 2022-06-13 DIAGNOSIS — G8929 Other chronic pain: Secondary | ICD-10-CM

## 2022-06-13 DIAGNOSIS — M5442 Lumbago with sciatica, left side: Secondary | ICD-10-CM

## 2022-06-13 MED ORDER — METHOCARBAMOL 500 MG PO TABS: 500.0000 mg | ORAL_TABLET | Freq: Four times a day (QID) | ORAL | 1 refills | Status: DC

## 2022-06-13 NOTE — Progress Notes (Signed)
Office Visit Note   Patient: Crystal Marquez           Date of Birth: 1960-11-28           MRN: [id number] Visit Date: 06/13/2022              Requested by: Rhea Bleacher, NP [place],  [place] [address] PCP: Rhea Bleacher, NP   Assessment & Plan: Visit Diagnoses:  1. Cervicalgia   2. Chronic bilateral low back pain with bilateral sciatica     Plan: Given patient's failure of conservative treatment for both of her neck and her lumbar spine recommend she follow-up with Dr. Donivan Scull in [place] [employer] who performed her cervical neck surgery.  We were able to contact his office and found that he does accept her new insurance.  She will make an appointment with him.  He will regards to follow-up with [place] she can follow-up with [place] as needed.  Questions were encouraged and answered at length.  Follow-Up Instructions: Return if symptoms worsen or fail to improve.   Orders:  No orders of the defined types were placed in this encounter.  No orders of the defined types were placed in this encounter.     Procedures: No procedures performed   Clinical Data: No additional findings.   Subjective: Chief Complaint  Patient presents with   Neck - Pain   Lower Back - Pain    HPI Crystal Marquez returns today for follow-up status post epidural steroid injection lumbar spine with Dr. Ernestina Patches on 05/09/2022.  She underwent bilateral L5-S1 articular injections.  States she got no relief with this.  She has been in therapy for her back.  She is also having neck pain feels this is getting worse.  Has a history of neck surgery with a fusion from C4-C7 by Dr.Durrani 3 years ago.  States that she was unable to get back in with his office due to a change in insurance she has had a new change in insurance since then.  She saw Dr. Durward Fortes here in our office and actually underwent a new MRI of the cervical spine which shows an acute finding of C2-C3 2 mm anterior listhesis with mild disc bulge and  uncovertebral hypertrophy.  Also advanced left-sided facet arthrosis from prior study and mild spinal stenosis with moderate left C3 narrowing.  Areas from C4-C7 with some residual areas of foraminal and central stenosis.  Small disc protrusion at C7-T1.  He sent her to formal therapy.  She states that this has not helped with her neck pain.  She states she is now having pain down the right arm and numbness in the right hand.  She is having left-sided neck pain.  She is on Cymbalta and Lyrica and these are really not helping with her back pain or neck pain.  She states she is unable to take NSAIDs.  She does note that the Robaxin in the past has helped some with her back and neck pain.  Review of Systems See HPI otherwise negative or noncontributory.  Objective: Vital Signs: LMP  (LMP Unknown)   Physical Exam Constitutional:      Appearance: She is not ill-appearing or diaphoretic.  Pulmonary:     Effort: Pulmonary effort is normal.  Neurological:     Mental Status: She is alert and oriented to person, place, and time.  Psychiatric:        Mood and Affect: Mood normal.     Ortho  Exam Lower extremities 5 out of 5 strength throughout against resistance negative straight leg raise bilaterally.  Limited extension of the lumbar spine.  Full flexion lumbar spine without pain.  Tenderness lower lumbar paraspinous region bilaterally.  Good range of motion bilateral hips without pain. Specialty Comments:  No specialty comments available.  Imaging: No results found.   PMFS History: Patient Active Problem List   Diagnosis Date Noted   Mixed dyslipidemia 04/05/2022   Neck pain 11/11/2021   Low back pain 11/11/2021   Unilateral primary osteoarthritis, right knee 11/03/2021   Coronary atherosclerosis 10/06/2021   Dyspnea on exertion 10/06/2021   Polyarthralgia 09/30/2021   Sjogren syndrome, unspecified ([place]) 09/30/2021   Fibromyalgia 09/30/2021   Age-related osteoporosis without current  pathological fracture 09/09/2021   Allergic rhinitis 09/09/2021   Anxiety 09/09/2021   Asthma 09/09/2021   Chronic kidney disease 09/09/2021   Chronic pain syndrome 09/09/2021   Colon polyp 09/09/2021   GERD (gastroesophageal reflux disease) 09/09/2021   Hypercalcemia 09/09/2021   Hypertension 09/09/2021   Major depressive disorder, single episode 09/09/2021   Myopathy 09/09/2021   Neuropathy 09/09/2021   Reflux esophagitis 09/09/2021   Sjogren's disease ([place]) 09/09/2021   Mild intermittent asthma without complication 35/00/9381   B12 deficiency 05/06/2020   Colitis 05/06/2020   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[place]) 05/06/2020   Iron deficiency anemia due to chronic blood loss 05/06/2020   Benign essential HTN 08/29/2019   Class 2 obesity due to excess calories without serious comorbidity with body mass index (BMI) of 36.0 to 36.9 in adult 08/29/2019   Dyslipidemia 08/29/2019   Type 2 diabetes mellitus without complication, without long-term current use of insulin ([place]) 08/29/2019   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   Pain in right hand 08/09/2018   Tenosynovitis of left hand 08/09/2018   Spinal stenosis of lumbar region 12/13/2017   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   Past Medical History:  Diagnosis Date   Age-related osteoporosis without current pathological fracture    Allergic rhinitis    Anxiety    Asthma    B12 deficiency 05/06/2020   Benign essential HTN 08/29/2019   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   Chronic kidney disease    Chronic pain syndrome    Class 2 obesity due to excess calories without serious comorbidity with body mass index (BMI) of 36.0 to 36.9 in adult 08/29/2019   Colitis 05/06/2020   Colon polyp    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[place]) 05/06/2020   Coronary atherosclerosis 10/06/2021   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   Dyslipidemia 08/29/2019   Dyspnea on exertion 10/06/2021   Fibromyalgia 09/30/2021    GERD (gastroesophageal reflux disease)    Hypercalcemia    Hypertension    Iron deficiency anemia due to chronic blood loss 05/06/2020   Low back pain 11/11/2021   Major depressive disorder, single episode    Mild intermittent asthma without complication 82/99/3716   Myopathy    Neck pain 11/11/2021   Neuropathy    Pain in right hand 08/09/2018   Polyarthralgia 09/30/2021   Reflux esophagitis    Sjogren syndrome, unspecified ([place]) 09/30/2021   Sjogren's disease ([place])    Spinal stenosis of lumbar region 12/13/2017   Tenosynovitis of left hand 08/09/2018   Type 2 diabetes mellitus without complication, without long-term current use of insulin ([place]) 08/29/2019   Unilateral primary osteoarthritis, right knee 11/03/2021    Family History  Problem Relation Age of Onset   Asthma Mother  COPD Mother    Diabetes Mother    Hypertension Mother    Kidney disease Mother    Heart disease Father    Colon polyps Father    Skin cancer Father    Depression Father    Diabetes Father    Colon polyps Sister    Crohn's disease Sister    Diverticulitis Sister    Hypercholesterolemia Sister    Kidney disease Brother    Heart disease Maternal Grandmother    Heart disease Paternal Grandmother    Colon cancer Neg Hx    Rectal cancer Neg Hx    Stomach cancer Neg Hx     Past Surgical History:  Procedure Laterality Date   ABDOMINAL HYSTERECTOMY     CARPAL TUNNEL RELEASE Left 2018   CERVICAL FUSION  10/07/2019   Anterior Cervical Discectomy due to spinal stenosis   CESAREAN SECTION     COLONOSCOPY     UPPER GASTROINTESTINAL ENDOSCOPY     Social History   Occupational History   Not on file  Tobacco Use   Smoking status: Never   Smokeless tobacco: Never  Vaping Use   Vaping Use: Never used  Substance and Sexual Activity   Alcohol use: No    Alcohol/week: 0.0 standard drinks of alcohol   Drug use: No   Sexual activity: Not on file

## 2022-06-14 ENCOUNTER — Other Ambulatory Visit (HOSPITAL_COMMUNITY): Payer: Self-pay

## 2022-06-14 NOTE — Telephone Encounter (Signed)
Test billing for this patient's plan returns the following results for ICS/LABA products: Advair/Wixela $20.00 Advair HFA non-formulary Breo $40.00 Dulera non-formulary Symbicort non-formulary  LAMA products present the following copays: Incruse $40.00 Spiriva Handihaler non-formulary Spiriva Respimat non-formulary Tudorza non-formulary  Neither triple therapy option for inhaler (Trelegy, Judithann Sauger) are covered by this plan.

## 2022-06-14 NOTE — Telephone Encounter (Signed)
Called and spoke with patient.  Patient stated she called her insurance about Trelegy and was told it is not a preferred inhaler.  Patient stated she was not told what inhalers are approved. Patient stated she has changed to [employer] and is having issues with medications being covered. Advised patient I would send her message to the Pharmacy Team to see if they can find the preferred inhalers for Dr. Silas Flood to decide what she could use in place of Trelegy.   Message routed to Pharmacy Team

## 2022-06-15 MED ORDER — INCRUSE ELLIPTA 62.5 MCG/ACT IN AEPB: 1.0000 | INHALATION_SPRAY | Freq: Every day | RESPIRATORY_TRACT | 6 refills | Status: DC

## 2022-06-15 MED ORDER — FLUTICASONE-SALMETEROL 500-50 MCG/ACT IN AEPB: 1.0000 | INHALATION_SPRAY | Freq: Two times a day (BID) | RESPIRATORY_TRACT | 6 refills | Status: DC

## 2022-06-15 NOTE — Telephone Encounter (Signed)
Routed to Dr. Silas Flood to advise

## 2022-06-15 NOTE — Telephone Encounter (Signed)
Wixella 500 mcg dose 1 puff BID and Incruse 1 puff daily, I sent to pharmacy with 6 refills each. Cost will be $60 a month and this replicates what is in Trelegy, just in 2 inhalers instead of 1.   We mailed her paperwork for Kathrin Greathouse 2 months ago - can you ask if she has had a chance to send this back?

## 2022-06-17 NOTE — Telephone Encounter (Signed)
Called and spoke with patient. She verbalized understanding.   Nothing further needed at time of call.  

## 2022-06-28 ENCOUNTER — Telehealth: Payer: Self-pay | Admitting: Cardiology

## 2022-06-28 ENCOUNTER — Ambulatory Visit: Payer: Commercial Managed Care - HMO | Admitting: Pulmonary Disease

## 2022-06-28 ENCOUNTER — Encounter: Payer: Self-pay | Admitting: Pulmonary Disease

## 2022-06-28 VITALS — BP 130/74 | HR 78 | Temp 98.5°F | Wt 173.8 lb

## 2022-06-28 DIAGNOSIS — J4550 Severe persistent asthma, uncomplicated: Secondary | ICD-10-CM | POA: Diagnosis not present

## 2022-06-28 NOTE — Progress Notes (Signed)
_0  ID: Crystal Marquez, female    DOB: 02-25-1961, 61 y.o.   MRN: [id number]  Chief Complaint  Patient presents with   Follow-up    Follow up for asthma. Pt brought tezspire paperwork with her today. Pt states the wixella is doing ok for her and her asthma. Pt has had some flare ups with her asthma. Pt is also on incuse inhaler. Albuterol and duoneb treatments.     Referring provider: Rhea Bleacher, NP  HPI:   61 y.o. woman whom we are seeing in follow up for dyspnea, wheezing felt to be related to asthma.  Most recent cardiology note reviewed.  Returns for scheduled follow-up.  Historically improved with ICS/LABA therapy.  Escalated to Trelegy given recurrent symptoms and frequent albuterol use.  Trelegy helped immensely.  Was using albuterol about once a week.  Unfortunate, her insurance would not approve this.  She was denied patient assistance.  Visit she was changed to [place] and [place].  Relatively poor control disease since then.  Using albuterol least once daily, often twice daily.  This is much worse than the once a week prior.  We discussed the role of biologic therapy at last visit and given her symptoms poorly controlled plan to move forward with this.   HPI at initial visit: Patient in normal state of health.  Got COVID October 2022.  Since then she is had intermittent issues with shortness of breath, wheezing.  Occasional cough.  Sometimes worse in the evenings.  Worse with exertion although can occur at rest as well.  The feeling of inability get deep breath, mild chest tightness.  Seems to come and go.  Some days are better than others.  No other seasonal environmental factors he can identify to make things better or worse.  Mild relief with prednisone, albuterol.  However after prednisone symptoms returned.  She reports history of recurrent bronchitis usually at least once a year sometimes twice a year with season changes.  Reviewed CT chest 08/23/2021 reveals clear lungs  bilaterally on my review interpretation.  Reviewed echocardiogram 09/02/2021 that shows reduction in EF to 40% as well as grade 2 diastolic dysfunction and dilated left atrium, moderate MVR, normal RA size and normal RV function and size.   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  Lab Results: Personally reviewed via [place] with mild elevation in eosinophils 200 and 02/2020 CBC    Component Value Date/Time   WBC 8.1 03/25/2022 1102  RBC 5.46 (H) 03/25/2022 1102   HGB 15.5 (H) 03/25/2022 1102   HCT 47.4 (H) 03/25/2022 1102   PLT 156.0 03/25/2022 1102   MCV 86.8 03/25/2022 1102   MCH 28.2 02/01/2022 1648   MCHC 32.6 03/25/2022 1102   RDW 15.9 (H) 03/25/2022 1102   LYMPHSABS 1.6 03/25/2022 1102   MONOABS 0.6 03/25/2022 1102   EOSABS 0.1 03/25/2022 1102   BASOSABS 0.1 03/25/2022 1102    BMET    Component Value Date/Time   NA 144 04/05/2022 0935   K 4.8 04/05/2022 0935   CL 105 04/05/2022 0935   CO2 24 04/05/2022 0935   GLUCOSE 145 (H) 04/05/2022 0935   GLUCOSE 85 02/01/2022 1648   BUN 18 04/05/2022 0935   CREATININE 1.10 (H) 04/05/2022 0935   CALCIUM 10.0 04/05/2022 0935    GFRNONAA 51 (L) 02/01/2022 1648    BNP No results found for: "BNP"  ProBNP No results found for: "PROBNP"  Specialty Problems       Pulmonary Problems   Mild intermittent asthma without complication   Allergic rhinitis   Asthma   Dyspnea on exertion    Allergies  Allergen Reactions   Lisinopril Shortness Of Breath    Bloating     Immunization History  Administered Date(s) Administered   Influenza,inj,quad, With Preservative 06/19/2017   Influenza-Unspecified 06/28/2021   PFIZER(Purple Top)SARS-COV-2 Vaccination 12/19/2019, 01/13/2020    Past Medical History:  Diagnosis Date   Age-related osteoporosis without current pathological fracture    Allergic rhinitis    Anxiety    Asthma    B12 deficiency 05/06/2020   Benign essential HTN 08/29/2019   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   Chronic kidney disease    Chronic pain syndrome    Class 2 obesity due to excess calories without serious comorbidity with body mass index (BMI) of 36.0 to 36.9 in adult 08/29/2019   Colitis 05/06/2020   Colon polyp    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[place]) 05/06/2020   Coronary atherosclerosis 10/06/2021   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   Dyslipidemia 08/29/2019   Dyspnea on exertion 10/06/2021   Fibromyalgia 09/30/2021   GERD (gastroesophageal reflux disease)    Hypercalcemia    Hypertension    Iron deficiency anemia due to chronic blood loss 05/06/2020   Low back pain 11/11/2021   Major depressive disorder, single episode    Mild intermittent asthma without complication 65/53/7482   Myopathy    Neck pain 11/11/2021   Neuropathy    Pain in right hand 08/09/2018   Polyarthralgia 09/30/2021   Reflux esophagitis    Sjogren syndrome, unspecified ([place]) 09/30/2021   Sjogren's disease ([place])    Spinal stenosis of lumbar region 12/13/2017   Tenosynovitis of left hand 08/09/2018   Type 2 diabetes mellitus without complication, without long-term current use of  insulin ([place]) 08/29/2019   Unilateral primary osteoarthritis, right knee 11/03/2021    Tobacco History: Social History   Tobacco Use  Smoking Status Never  Smokeless Tobacco Never   Counseling given: Not Answered   Continue to not smoke  Outpatient Encounter Medications as of 06/28/2022  Medication Sig   albuterol (PROVENTIL) (2.5 MG/3ML) 0.083% nebulizer solution Take 2.5 mg by nebulization every 6 (six) hours as needed for wheezing or shortness of breath.   albuterol (VENTOLIN HFA) 108 (90 Base) MCG/ACT inhaler Inhale 1 puff into the lungs every 6 (six) hours as needed for wheezing or shortness of breath.   ALPRAZolam (XANAX) 0.5 MG tablet Take  0.5 mg by mouth 3 (three) times daily as needed for anxiety.   Cholecalciferol (VITAMIN D3) 125 MCG (5000 UT) CAPS Take 1 capsule by mouth daily.   [employer] SENNA 8.6 MG tablet TAKE 1 TABLET (8.6 MG TOTAL) BY MOUTH EVERY OTHER DAY.   Cyanocobalamin (B-12 COMPLIANCE INJECTION) 1000 MCG/ML KIT Inject 1,000 mcg as directed every 30 (thirty) days.   cycloSPORINE (RESTASIS) 0.05 % ophthalmic emulsion Place 1 drop into both eyes 2 (two) times daily.   DULoxetine (CYMBALTA) 60 MG capsule Take 120 mg by mouth daily.   ferrous sulfate 325 (65 FE) MG tablet Take 325 mg by mouth daily with breakfast.   fexofenadine (ALLEGRA) 180 MG tablet Take 180 mg by mouth daily.   fluticasone-salmeterol (WIXELA INHUB) 500-50 MCG/ACT AEPB Inhale 1 puff into the lungs in the morning and at bedtime.   Folic Acid 0.8 MG CAPS Take 2 capsules by mouth daily.   hydrocortisone (ANUSOL-HC) 2.5 % rectal cream Place 1 application rectally daily.   hydroxychloroquine (PLAQUENIL) 200 MG tablet Take 400 mg by mouth daily.   ipratropium-albuterol (DUONEB) 0.5-2.5 (3) MG/3ML SOLN Take 3 mLs by nebulization daily as needed (shortness of breath).   JARDIANCE 25 MG TABS tablet Take 25 mg by mouth daily.   metFORMIN (GLUCOPHAGE) 500 MG tablet Take 500 mg by mouth 2 (two) times daily with a  meal.   methocarbamol (ROBAXIN) 500 MG tablet Take 1 tablet (500 mg total) by mouth 4 (four) times daily.   montelukast (SINGULAIR) 10 MG tablet Take 10 mg by mouth at bedtime.   mupirocin ointment (BACTROBAN) 2 % Apply 1 application topically 2 (two) times daily.   NYSTATIN powder Apply 1 application topically 3 (three) times daily.   pantoprazole (PROTONIX) 40 MG tablet Take 40 mg by mouth daily.   polyethylene glycol (MIRALAX / GLYCOLAX) 17 g packet Take 17 g by mouth every other day.   potassium chloride SA (K-DUR,KLOR-CON) 20 MEQ tablet Take 20 mEq by mouth daily.   pregabalin (LYRICA) 150 MG capsule Take 150 mg by mouth 3 (three) times daily.   promethazine (PHENERGAN) 12.5 MG tablet Take 12.5 mg by mouth every 6 (six) hours as needed for nausea or vomiting.   rosuvastatin (CRESTOR) 5 MG tablet Take 5 mg by mouth daily.   sacubitril-valsartan (ENTRESTO) 24-26 MG Take 1 tablet by mouth 2 (two) times daily.   umeclidinium bromide (INCRUSE ELLIPTA) 62.5 MCG/ACT AEPB Inhale 1 puff into the lungs daily.   No facility-administered encounter medications on file as of 06/28/2022.     Review of Systems  Review of Systems  N/a Physical Exam  BP 130/74 (BP Location: Left Arm, Patient Position: Sitting, Cuff Size: Normal)   Pulse 78   Temp 98.5 F (36.9 C) (Oral)   Wt 173 lb 12.8 oz (78.8 kg)   LMP  (LMP Unknown)   SpO2 98%   BMI 35.10 kg/m   Wt Readings from Last 5 Encounters:  06/28/22 173 lb 12.8 oz (78.8 kg)  04/05/22 174 lb (78.9 kg)  03/25/22 173 lb 9.6 oz (78.7 kg)  02/01/22 170 lb (77.1 kg)  12/20/21 182 lb 9.6 oz (82.8 kg)    BMI Readings from Last 5 Encounters:  06/28/22 35.10 kg/m  04/05/22 35.14 kg/m  03/25/22 35.06 kg/m  02/01/22 34.34 kg/m  12/20/21 36.88 kg/m     Physical Exam General: Well-appearing, sitting in chair Eyes: EOMI, icterus Neck: Supple, no JVP Pulmonary: Clear, no wheeze Cardiovascular: Regular in rhythm, no murmur  Abdomen:  Nondistended, bowel sounds present MSK: No synovitis, no joint effusion Neuro: Normal gait, no weakness Psych: Normal mood, full affect   Assessment & Plan:   Dyspnea on exertion: Likely multifactorial.  Contribution of cardiac causes given her reduced EF to 40% as well as left atrial dilation and MVR.  Fortunately right side looks reassuring in terms of pulmonary hypertension.  Additionally, high suspicion for poorly controlled asthma especially with exacerbation or worsening since COVID infection 06/2021.  Improved overall with Breo.  Further improvement with Trelegy high-dose, down to once weekly albuterol use.  Her insurance company, [place], will not approve Trelegy.  She has worse control since then on Wixela and Incruse for triple inhaled therapy.  Albuterol does provide some temporary improvement.  Moderate to severe persistent asthma: Previous diagnosis.  Suspicious for uncontrolled asthma given worsening in symptoms after COVID infection, post viral.  Disease control was adequate on Trelegy high-dose.  She no longer has access to this as her insurance does not cover it and she was denied patient assistance.  This Wixela and Incruse.  Worsening symptoms, using albuterol 1-2 times daily.  Albuterol does provide some temporary relief.  IgE and eos not elevated.  Will move forward with Tezspire, paperwork completed today.  This will require intensive drug therapy monitoring.  Return in about 3 months (around 09/28/2022).   Lanier Clam, MD 06/28/2022

## 2022-06-28 NOTE — Telephone Encounter (Signed)
Called pt in regards to [place] cost.  Pt reports insurance has changed and can not afford med now.   Pt reports does not have commercial insurance.  Does not think has ever used 30 day free trial.  Gave pt information for 30 day free trial.  Pt to call pharmacy to see if code is valid.  Also gave pt information for [employer] patient assistance.    Advised pt to call back if needs additional assistance.

## 2022-06-28 NOTE — Telephone Encounter (Signed)
Pt c/o medication issue:  1. Name of Medication: sacubitril-valsartan (ENTRESTO) 24-26 MG  2. How are you currently taking this medication (dosage and times per day)? As prescribed   3. Are you having a reaction (difficulty breathing--STAT)? No   4. What is your medication issue? Patient is calling stating she has switched insurance from Friday to [place] and they do not cover this medication. She states it would cost her over $700 a month and she cannot afford this. Patient has enough medication to last her until Wednesday at this time. Please advise.

## 2022-06-28 NOTE — Patient Instructions (Signed)
Nice to see you again  Continue Wixela and Incruse for now  I will send an additional appeal for the Trelegy but unfortunately I think it will be denied again  Assuming Trelegy will not be an option, we will move forward with Tezspire injections.  Thank you for bringing the paperwork.  We will work through Administrator. to get it as is cost effective as possible.  Return to clinic in 3 months or sooner as needed with Dr. Silas Flood

## 2022-06-30 NOTE — Telephone Encounter (Signed)
Called pt back reports was able to use 30 day free trial.  Also has a $10 co pay card will check with pharmacy to see if can use it.  Advised pt to find out before 30 days so will know if needs patient assistance.  Pt had no questions or concerns.

## 2022-07-01 ENCOUNTER — Other Ambulatory Visit (HOSPITAL_COMMUNITY): Payer: Self-pay

## 2022-07-01 ENCOUNTER — Telehealth: Payer: Self-pay

## 2022-07-01 NOTE — Telephone Encounter (Signed)
Received new start paperwork for Tezspire '210mg'$  pen. We will begin BIV process.

## 2022-07-01 NOTE — Telephone Encounter (Signed)
PA has been submitted for new start Tezspire '210MG'$ /1.91ML auto-injectors.  Awaiting determination will update.  Key: [id number]

## 2022-07-05 ENCOUNTER — Encounter (HOSPITAL_BASED_OUTPATIENT_CLINIC_OR_DEPARTMENT_OTHER): Payer: Self-pay | Admitting: Emergency Medicine

## 2022-07-05 ENCOUNTER — Emergency Department (HOSPITAL_BASED_OUTPATIENT_CLINIC_OR_DEPARTMENT_OTHER)
Admission: EM | Admit: 2022-07-05 | Discharge: 2022-07-06 | Disposition: A | Discharge status: Home / Self Care | Payer: Commercial Managed Care - HMO | Attending: Emergency Medicine | Admitting: Emergency Medicine

## 2022-07-05 ENCOUNTER — Other Ambulatory Visit: Payer: Self-pay

## 2022-07-05 ENCOUNTER — Emergency Department (HOSPITAL_BASED_OUTPATIENT_CLINIC_OR_DEPARTMENT_OTHER): Payer: Commercial Managed Care - HMO

## 2022-07-05 DIAGNOSIS — Z79899 Other long term (current) drug therapy: Secondary | ICD-10-CM | POA: Insufficient documentation

## 2022-07-05 DIAGNOSIS — I10 Essential (primary) hypertension: Secondary | ICD-10-CM | POA: Insufficient documentation

## 2022-07-05 DIAGNOSIS — R0789 Other chest pain: Secondary | ICD-10-CM | POA: Insufficient documentation

## 2022-07-05 DIAGNOSIS — J45909 Unspecified asthma, uncomplicated: Secondary | ICD-10-CM | POA: Diagnosis not present

## 2022-07-05 DIAGNOSIS — Z7951 Long term (current) use of inhaled steroids: Secondary | ICD-10-CM | POA: Insufficient documentation

## 2022-07-05 LAB — TROPONIN I (HIGH SENSITIVITY): Troponin I (High Sensitivity): 10 ng/L (ref <18)

## 2022-07-05 LAB — CBC
HCT: 47.8 % — ABNORMAL HIGH (ref 36.0–46.0)
Hemoglobin: 15.4 g/dL — ABNORMAL HIGH (ref 12.0–15.0)
MCH: 28.4 pg (ref 26.0–34.0)
MCHC: 32.2 g/dL (ref 30.0–36.0)
MCV: 88 fL (ref 80.0–100.0)
Platelets: 184 10*3/uL (ref 150–400)
RBC: 5.43 MIL/uL — ABNORMAL HIGH (ref 3.87–5.11)
RDW: 15 % (ref 11.5–15.5)
WBC: 9.5 10*3/uL (ref 4.0–10.5)
nRBC: 0 % (ref 0.0–0.2)

## 2022-07-05 LAB — BASIC METABOLIC PANEL
Anion gap: 7 (ref 5–15)
BUN: 15 mg/dL (ref 6–20)
CO2: 25 mmol/L (ref 22–32)
Calcium: 9.5 mg/dL (ref 8.9–10.3)
Chloride: 108 mmol/L (ref 98–111)
Creatinine, Ser: 0.97 mg/dL (ref 0.44–1.00)
GFR, Estimated: >60 mL/min (ref >60)
Glucose, Bld: 105 mg/dL — ABNORMAL HIGH (ref 70–99)
Potassium: 4.3 mmol/L (ref 3.5–5.1)
Sodium: 140 mmol/L (ref 135–145)

## 2022-07-05 NOTE — ED Triage Notes (Signed)
Intermittent left sided chest pain with radiation down left arm, nausea, dizziness, and shob X 2 -3 days. Also endorses intermittent chest "heaviness" States she is shob at baseline but has worsened over the past few days.

## 2022-07-06 ENCOUNTER — Other Ambulatory Visit: Payer: Self-pay

## 2022-07-06 NOTE — ED Provider Notes (Signed)
[facility] EMERGENCY DEPARTMENT Provider Note   CSN: [id number] Arrival date & time: 07/05/22  1838     History  Chief Complaint  Patient presents with   Chest Pain    Crystal Marquez is a 61 y.o. female.  Patient is a 61 year old female with past medical history of hypertension, asthma.  Patient presenting today with complaints of chest discomfort.  She describes a 2-day history of tightness to the front of her chest.  This seems to come and go and is unrelated to exertion.  There are no aggravating or alleviating factors.  She denies shortness of breath, nausea, diaphoresis, or radiation to the arm or jaw.  She denies any recent exertional symptoms.  She has no prior cardiac history, but did have a stress echo performed in February 2023.  The history is provided by the patient.       Home Medications Prior to Admission medications   Medication Sig Start Date End Date Taking? Authorizing Provider  albuterol (PROVENTIL) (2.5 MG/3ML) 0.083% nebulizer solution Take 2.5 mg by nebulization every 6 (six) hours as needed for wheezing or shortness of breath.    [provider]  albuterol (VENTOLIN HFA) 108 (90 Base) MCG/ACT inhaler Inhale 1 puff into the lungs every 6 (six) hours as needed for wheezing or shortness of breath.    [provider]  ALPRAZolam Duanne Moron) 0.5 MG tablet Take 0.5 mg by mouth 3 (three) times daily as needed for anxiety. 03/15/22   [provider]  Cholecalciferol (VITAMIN D3) 125 MCG (5000 UT) CAPS Take 1 capsule by mouth daily.    [provider]  [employer] SENNA 8.6 MG tablet TAKE 1 TABLET (8.6 MG TOTAL) BY MOUTH EVERY OTHER DAY. 12/09/21   Daryel November, MD  Cyanocobalamin (B-12 COMPLIANCE INJECTION) 1000 MCG/ML KIT Inject 1,000 mcg as directed every 30 (thirty) days.    [provider]  cycloSPORINE (RESTASIS) 0.05 % ophthalmic emulsion Place 1 drop into both eyes 2 (two) times daily.    [provider]   DULoxetine (CYMBALTA) 60 MG capsule Take 120 mg by mouth daily. 06/20/21   [provider]  ferrous sulfate 325 (65 FE) MG tablet Take 325 mg by mouth daily with breakfast.    [provider]  fexofenadine (ALLEGRA) 180 MG tablet Take 180 mg by mouth daily. 01/17/22   [provider]  fluticasone-salmeterol (WIXELA INHUB) 500-50 MCG/ACT AEPB Inhale 1 puff into the lungs in the morning and at bedtime. 06/15/22   Hunsucker, Bonna Gains, MD  Folic Acid 0.8 MG CAPS Take 2 capsules by mouth daily.    [provider]  hydrocortisone (ANUSOL-HC) 2.5 % rectal cream Place 1 application rectally daily.    [provider]  hydroxychloroquine (PLAQUENIL) 200 MG tablet Take 400 mg by mouth daily. 10/17/14   [provider]  ipratropium-albuterol (DUONEB) 0.5-2.5 (3) MG/3ML SOLN Take 3 mLs by nebulization daily as needed (shortness of breath).    [provider]  JARDIANCE 25 MG TABS tablet Take 25 mg by mouth daily. 09/21/21   [provider]  metFORMIN (GLUCOPHAGE) 500 MG tablet Take 500 mg by mouth 2 (two) times daily with a meal.    [provider]  methocarbamol (ROBAXIN) 500 MG tablet Take 1 tablet (500 mg total) by mouth 4 (four) times daily. 06/13/22   Pete Pelt, PA-C  montelukast (SINGULAIR) 10 MG tablet Take 10 mg by mouth at bedtime. 08/09/21   [provider]  mupirocin ointment (BACTROBAN) 2 % Apply 1 application topically 2 (two) times daily.    [provider]  NYSTATIN powder Apply 1 application topically 3 (three) times daily. 07/29/21   [provider]  pantoprazole (PROTONIX) 40 MG tablet Take 40 mg by mouth daily.    [provider]  polyethylene glycol (MIRALAX / GLYCOLAX) 17 g packet Take 17 g by mouth every other day.    [provider]  potassium chloride SA (K-DUR,KLOR-CON) 20 MEQ tablet Take 20 mEq by mouth daily.    [provider]  pregabalin (LYRICA)  150 MG capsule Take 150 mg by mouth 3 (three) times daily. 02/20/22   [provider]  promethazine (PHENERGAN) 12.5 MG tablet Take 12.5 mg by mouth every 6 (six) hours as needed for nausea or vomiting.    [provider]  rosuvastatin (CRESTOR) 5 MG tablet Take 5 mg by mouth daily.    [provider]  sacubitril-valsartan (ENTRESTO) 24-26 MG Take 1 tablet by mouth 2 (two) times daily. 10/29/21   Park Liter, MD  umeclidinium bromide (INCRUSE ELLIPTA) 62.5 MCG/ACT AEPB Inhale 1 puff into the lungs daily. 06/15/22   Hunsucker, Bonna Gains, MD      Allergies    Lisinopril    Review of Systems   Review of Systems  All other systems reviewed and are negative.   Physical Exam Updated Vital Signs BP (!) 134/103 (BP Location: Right Arm)   Pulse 72   Temp 98.3 F (36.8 C) (Oral)   Resp (!) 24   Ht _0  (1.499 m)   Wt 78 kg   LMP  (LMP Unknown)   SpO2 99%   BMI 34.74 kg/m  Physical Exam Vitals and nursing note reviewed.  Constitutional:      General: She is not in acute distress.    Appearance: She is well-developed. She is not diaphoretic.  HENT:     Head: Normocephalic and atraumatic.  Cardiovascular:     Rate and Rhythm: Normal rate and regular rhythm.     Heart sounds: No murmur heard.    No friction rub. No gallop.  Pulmonary:     Effort: Pulmonary effort is normal. No respiratory distress.     Breath sounds: Normal breath sounds. No wheezing.  Abdominal:     General: Bowel sounds are normal. There is no distension.     Palpations: Abdomen is soft.     Tenderness: There is no abdominal tenderness.  Musculoskeletal:        General: Normal range of motion.     Cervical back: Normal range of motion and neck supple.     Right lower leg: No tenderness. No edema.     Left lower leg: No tenderness. No edema.  Skin:    General: Skin is warm and dry.  Neurological:     General: No focal deficit present.     Mental Status: She is alert and  oriented to person, place, and time.     ED Results / Procedures / Treatments   Labs (all labs ordered are listed, but only abnormal results are displayed) Labs Reviewed  BASIC METABOLIC PANEL - Abnormal; Notable for the following components:      Result Value   Glucose, Bld 105 (*)    All other components within normal limits  CBC - Abnormal; Notable for the following components:   RBC 5.43 (*)    Hemoglobin 15.4 (*)    HCT 47.8 (*)  All other components within normal limits  TROPONIN I (HIGH SENSITIVITY)  TROPONIN I (HIGH SENSITIVITY)    EKG EKG Interpretation  Date/Time:  Tuesday July 05 2022 19:53:29 EDT Ventricular Rate:  72 PR Interval:  146 QRS Duration: 92 QT Interval:  420 QTC Calculation: 459 R Axis:   57 Text Interpretation: Normal sinus rhythm Normal ECG No previous ECGs available Confirmed by Veryl Speak [phone]) on 07/06/2022 12:33:53 AM  Radiology DG Chest 2 View  Result Date: 07/05/2022 CLINICAL DATA:  Intermittent left-sided chest pain with left upper extremity radiation, nausea, dizziness and shortness of breath. EXAM: CHEST - 2 VIEW COMPARISON:  Chest PA Lat 10/12/2021. FINDINGS: There is mild cardiomegaly without evidence of CHF. No pleural effusion is seen. The mediastinum is normally outlined. The lungs are clear. There is thoracic spondylosis and old 4 level ACDF plating C4-7. IMPRESSION: No active cardiopulmonary disease.  Stable chest with cardiomegaly. Electronically Signed   By: Telford Nab M.D.   On: 07/05/2022 20:08    Procedures Procedures    Medications Ordered in ED Medications - No data to display  ED Course/ Medical Decision Making/ A&P  Patient is a 61 year old female presenting with complaints of chest discomfort as described in the HPI.  She arrives here with stable vital signs and is afebrile.  Physical examination reveals normal heart sounds and breath sounds.  Legs without edema.  Physical examination otherwise  unremarkable.  Work-up initiated including CBC, metabolic panel, troponin, EKG, and chest x-ray.  These were all essentially unremarkable.  EKG is unchanged and troponin is negative despite 2 days of symptoms.  Chest x-ray is clear.  At this point, I feel as though patient can safely be discharged.  I highly doubt a cardiac etiology.  She has had a negative troponin and unchanged EKG despite 2 days of symptoms.  Also reassuring is the fact she had a stress echo in February of this year showing low risk results.  I also highly doubt pulmonary embolism or other acute process.  Symptoms likely musculoskeletal in nature.  Patient will be advised to take ibuprofen and return as needed for any problems.  Final Clinical Impression(s) / ED Diagnoses Final diagnoses:  None    Rx / DC Orders ED Discharge Orders     None         Veryl Speak, MD 07/06/22 [phone]

## 2022-07-06 NOTE — Discharge Instructions (Signed)
Begin taking ibuprofen 600 mg every 6 hours as needed for pain.  Follow-up with primary doctor if not improving in the next few days, and return to the ER if symptoms significantly worsen or change.

## 2022-07-07 ENCOUNTER — Other Ambulatory Visit (HOSPITAL_COMMUNITY): Payer: Self-pay

## 2022-07-12 DIAGNOSIS — U071 COVID-19: Secondary | ICD-10-CM | POA: Insufficient documentation

## 2022-07-12 DIAGNOSIS — R002 Palpitations: Secondary | ICD-10-CM | POA: Insufficient documentation

## 2022-07-12 DIAGNOSIS — R5383 Other fatigue: Secondary | ICD-10-CM | POA: Insufficient documentation

## 2022-07-12 NOTE — Telephone Encounter (Signed)
PA still pending through [employer]

## 2022-07-15 ENCOUNTER — Other Ambulatory Visit (HOSPITAL_COMMUNITY): Payer: Self-pay

## 2022-07-15 NOTE — Telephone Encounter (Signed)
Second follow up with [employer] [phone] to inquire about PA status of Tezspire injection.  PA is pending - Awaiting Medical Director sign off - per insurance rep [place].  Typical turnaround is 8 business days.  Verified that there is no additional information needed.

## 2022-07-18 ENCOUNTER — Telehealth: Payer: Self-pay

## 2022-07-18 ENCOUNTER — Telehealth: Payer: Self-pay | Admitting: Cardiology

## 2022-07-18 NOTE — Telephone Encounter (Signed)
Office calling to see if want to see the patient. States she is afib been having palpation, office sent over the rhythm stripe from heart monitor showing the afib. Please advise, office ask that we call the patient back.

## 2022-07-18 NOTE — Telephone Encounter (Signed)
Follow up from note sent by [facility]. Dr. Agustin Cree looked at her rhythm strips and recommended that she be seen next wee for follow up. Pt agreed and said she would see any doctor. Sent to front desk for appt to be scheduled.

## 2022-07-19 ENCOUNTER — Observation Stay (HOSPITAL_COMMUNITY)
Admission: EM | Admit: 2022-07-19 | Discharge: 2022-07-20 | Disposition: A | Discharge status: Home / Self Care | Payer: Commercial Managed Care - HMO | Attending: Internal Medicine | Admitting: Internal Medicine

## 2022-07-19 ENCOUNTER — Encounter (HOSPITAL_COMMUNITY): Payer: Self-pay

## 2022-07-19 ENCOUNTER — Emergency Department (HOSPITAL_COMMUNITY): Payer: Commercial Managed Care - HMO

## 2022-07-19 DIAGNOSIS — R079 Chest pain, unspecified: Secondary | ICD-10-CM | POA: Diagnosis present

## 2022-07-19 DIAGNOSIS — I110 Hypertensive heart disease with heart failure: Secondary | ICD-10-CM | POA: Insufficient documentation

## 2022-07-19 DIAGNOSIS — I25118 Atherosclerotic heart disease of native coronary artery with other forms of angina pectoris: Secondary | ICD-10-CM | POA: Diagnosis not present

## 2022-07-19 DIAGNOSIS — R0789 Other chest pain: Secondary | ICD-10-CM | POA: Diagnosis present

## 2022-07-19 DIAGNOSIS — I5022 Chronic systolic (congestive) heart failure: Secondary | ICD-10-CM

## 2022-07-19 DIAGNOSIS — Z7901 Long term (current) use of anticoagulants: Secondary | ICD-10-CM | POA: Diagnosis not present

## 2022-07-19 DIAGNOSIS — Z7984 Long term (current) use of oral hypoglycemic drugs: Secondary | ICD-10-CM | POA: Diagnosis not present

## 2022-07-19 DIAGNOSIS — J4550 Severe persistent asthma, uncomplicated: Secondary | ICD-10-CM | POA: Insufficient documentation

## 2022-07-19 DIAGNOSIS — I4891 Unspecified atrial fibrillation: Secondary | ICD-10-CM | POA: Insufficient documentation

## 2022-07-19 DIAGNOSIS — K219 Gastro-esophageal reflux disease without esophagitis: Secondary | ICD-10-CM | POA: Diagnosis not present

## 2022-07-19 DIAGNOSIS — Z79899 Other long term (current) drug therapy: Secondary | ICD-10-CM | POA: Diagnosis not present

## 2022-07-19 DIAGNOSIS — I200 Unstable angina: Secondary | ICD-10-CM

## 2022-07-19 LAB — BASIC METABOLIC PANEL
Anion gap: 11 (ref 5–15)
BUN: 19 mg/dL (ref 6–20)
CO2: 22 mmol/L (ref 22–32)
Calcium: 9.8 mg/dL (ref 8.9–10.3)
Chloride: 108 mmol/L (ref 98–111)
Creatinine, Ser: 0.95 mg/dL (ref 0.44–1.00)
GFR, Estimated: >60 mL/min (ref >60)
Glucose, Bld: 99 mg/dL (ref 70–99)
Potassium: 4.4 mmol/L (ref 3.5–5.1)
Sodium: 141 mmol/L (ref 135–145)

## 2022-07-19 LAB — TROPONIN I (HIGH SENSITIVITY)
Troponin I (High Sensitivity): 10 ng/L (ref <18)
Troponin I (High Sensitivity): 10 ng/L (ref <18)

## 2022-07-19 LAB — CBC
HCT: 47.8 % — ABNORMAL HIGH (ref 36.0–46.0)
Hemoglobin: 15.5 g/dL — ABNORMAL HIGH (ref 12.0–15.0)
MCH: 29.4 pg (ref 26.0–34.0)
MCHC: 32.4 g/dL (ref 30.0–36.0)
MCV: 90.5 fL (ref 80.0–100.0)
Platelets: 173 10*3/uL (ref 150–400)
RBC: 5.28 MIL/uL — ABNORMAL HIGH (ref 3.87–5.11)
RDW: 14.8 % (ref 11.5–15.5)
WBC: 8.5 10*3/uL (ref 4.0–10.5)
nRBC: 0 % (ref 0.0–0.2)

## 2022-07-19 NOTE — ED Provider Notes (Signed)
[facility] EMERGENCY DEPARTMENT Provider Note   CSN: [id number] Arrival date & time: 07/19/22  1339     History {Add pertinent medical, surgical, social history, OB history to HPI:1} Chief Complaint  Patient presents with   Chest Pain    Crystal Marquez is a 61 y.o. female.  The history is provided by the patient.  Patient with multiple cardiac risk factors presents with chest pain.  Patient reports she was walking with minimal exertion when she began having left arm pain, chest tightness and shortness of breath.  No diaphoresis or vomiting.  No syncope.  The pain did not improve until she received 2 nitroglycerin at her primary care office.  She reports over the past 2 weeks has had increasing episodes of chest pain.  She is also had feelings of heart palpitations and fluttering, and was recently diagnosed with atrial fibrillation.  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   Chronic kidney disease    Chronic pain syndrome    Class 2 obesity due to excess calories without serious comorbidity with body mass index (BMI) of 36.0 to 36.9 in adult 08/29/2019   Colitis 05/06/2020   Colon polyp    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 05/06/[place]   Coronary atherosclerosis 10/06/2021   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   Dyslipidemia 08/29/2019   Dyspnea on exertion 10/06/2021   Fibromyalgia 09/30/2021   GERD (gastroesophageal reflux disease)    Hypercalcemia    Hypertension    Iron deficiency anemia  due to chronic blood loss 05/06/2020   Low back pain 11/11/2021   Major depressive disorder, single episode    Mild intermittent asthma without complication 140/98/1191  Myopathy    Neck pain 11/11/2021   Neuropathy    Pain in right hand 08/09/2018   Polyarthralgia 09/30/2021   Reflux esophagitis    S[place]ogren syndrome, unspecified ([place] 09/30/2021   [place]'s disease ([place]    Spinal stenosis of lumbar region 12/13/2017   Tenosynovitis of left hand 08/09/2018   Type 2 diabetes mellitus without complication, without long-term current use of insulin ([place] 08/29/2019   Unilateral primary osteoarthritis, right knee 11/03/2021    Home Medications Prior to Admission medications   Medication Sig Start Date End Date Taking? Authorizing Provider  albuterol (PROVENTIL) (2.5 MG/3ML) 0.083% nebulizer solution Take 2.5 mg by nebulization every 6 (six) hours as needed for wheezing or shortness of breath.    [provider]  albuterol (VENTOLIN HFA) 108 (90 Base) MCG/ACT inhaler Inhale 1 puff into the lungs every 6 (six) hours as needed for wheezing or shortness of breath.    [provider]  ALPRAZolam (Duanne Moron 0.5 MG tablet Take 0.5 mg by mouth 3 (three) times daily as needed for anxiety. 03/15/22   [provider]  Cholecalciferol (VITAMIN D3) 125 MCG (5000 UT) CAPS Take 1 capsule by mouth daily.    [provider]  [employer] SENNA 8.6 MG tablet TAKE 1 TABLET (8.6 MG TOTAL) BY MOUTH EVERY OTHER DAY.  12/09/21   Daryel November, MD  Cyanocobalamin (B-12 COMPLIANCE INJECTION) 1000 MCG/ML KIT Inject 1,000 mcg as directed every 30 (thirty) days.    [provider]  cycloSPORINE (RESTASIS) 0.05 % ophthalmic emulsion Place 1 drop into both eyes 2 (two) times daily.    [provider]  DULoxetine (CYMBALTA) 60 MG capsule Take 120 mg by mouth daily. 06/20/21   [provider]  ferrous sulfate 325 (65 FE) MG tablet Take 325 mg by mouth daily with breakfast.     [provider]  fexofenadine (ALLEGRA) 180 MG tablet Take 180 mg by mouth daily. 01/17/22   [provider]  fluticasone-salmeterol (WIXELA INHUB) 500-50 MCG/ACT AEPB Inhale 1 puff into the lungs in the morning and at bedtime. 06/15/22   Hunsucker, Bonna Gains, MD  Folic Acid 0.8 MG CAPS Take 2 capsules by mouth daily.    [provider]  hydrocortisone (ANUSOL-HC) 2.5 % rectal cream Place 1 application rectally daily.    [provider]  hydroxychloroquine (PLAQUENIL) 200 MG tablet Take 400 mg by mouth daily. 10/17/14   [provider]  ipratropium-albuterol (DUONEB) 0.5-2.5 (3) MG/3ML SOLN Take 3 mLs by nebulization daily as needed (shortness of breath).    [provider]  JARDIANCE 25 MG TABS tablet Take 25 mg by mouth daily. 09/21/21   [provider]  metFORMIN (GLUCOPHAGE) 500 MG tablet Take 500 mg by mouth 2 (two) times daily with a meal.    [provider]  methocarbamol (ROBAXIN) 500 MG tablet Take 1 tablet (500 mg total) by mouth 4 (four) times daily. 06/13/22   Pete Pelt, PA-C  montelukast (SINGULAIR) 10 MG tablet Take 10 mg by mouth at bedtime. 08/09/21   [provider]  mupirocin ointment (BACTROBAN) 2 % Apply 1 application topically 2 (two) times daily.    [provider]  NYSTATIN powder Apply 1 application topically 3 (three) times daily. 07/29/21   [provider]  pantoprazole (PROTONIX) 40 MG tablet Take 40 mg by mouth daily.    [provider]  polyethylene glycol (MIRALAX / GLYCOLAX) 17 g packet Take 17 g by mouth every other day.    [provider]  potassium chloride SA (K-DUR,KLOR-CON) 20 MEQ tablet Take 20 mEq by mouth daily.    [provider]  pregabalin (LYRICA) 150 MG capsule Take 150 mg by mouth 3 (three) times daily. 02/20/22   [provider]  promethazine (PHENERGAN) 12.5 MG tablet Take 12.5 mg by mouth every 6 (six) hours as needed  for nausea or vomiting.    [provider]  rosuvastatin (CRESTOR) 5 MG tablet Take 5 mg by mouth daily.    [provider]  sacubitril-valsartan (ENTRESTO) 24-26 MG Take 1 tablet by mouth 2 (two) times daily. 10/29/21   Park Liter, MD  umeclidinium bromide (INCRUSE ELLIPTA) 62.5 MCG/ACT AEPB Inhale 1 puff into the lungs daily. 06/15/22   Hunsucker, Bonna Gains, MD      Allergies    Lisinopril    Review of Systems   Review of Systems  Constitutional:  Negative for diaphoresis and fever.  Respiratory:  Positive for shortness of breath.   Cardiovascular:  Positive for chest pain.  Gastrointestinal:  Negative for vomiting.  Neurological:  Negative for weakness.    Physical Exam Updated Vital Signs BP 131/72   Pulse 89   Temp 98.5 F (36.9 C)   Resp 19   Ht 1.499 m (_0 )  Wt 78 kg   LMP  (LMP Unknown)   SpO2 98%   BMI 34.74 kg/m  Physical Exam CONSTITUTIONAL: Appears older than stated age, no acute distress HEAD: Normocephalic/atraumatic EYES: EOMI/PERRL ENMT: Mucous membranes moist NECK: supple no meningeal signs SPINE/BACK:entire spine nontender CV: S1/S2 noted, no murmurs/rubs/gallops noted LUNGS: Lungs are clear to auscultation bilaterally, no apparent distress ABDOMEN: soft, nontender, obese NEURO: Pt is awake/alert/appropriate, moves all extremitiesx4.  No facial droop.   EXTREMITIES: pulses normal/equalx4, full ROM SKIN: warm, color normal PSYCH: no abnormalities of mood noted, alert and oriented to situation  ED Results / Procedures / Treatments   Labs (all labs ordered are listed, but only abnormal results are displayed) Labs Reviewed  CBC - Abnormal; Notable for the following components:      Result Value   RBC 5.28 (*)    Hemoglobin 15.5 (*)    HCT 47.8 (*)    All other components within normal limits  BASIC METABOLIC PANEL  TROPONIN I (HIGH SENSITIVITY)  TROPONIN I (HIGH SENSITIVITY)    EKG EKG  Interpretation  Date/Time:  Tuesday July 19 2022 21:46:42 EDT Ventricular Rate:  80 PR Interval:  152 QRS Duration: 96 QT Interval:  400 QTC Calculation: 461 R Axis:   32 Text Interpretation: Normal sinus rhythm Anterior infarct , age undetermined Abnormal ECG Confirmed by Ripley Fraise [phone]) on 07/19/2022 11:23:53 PM  Radiology DG Chest 2 View  Result Date: 07/19/2022 CLINICAL DATA:  Chest pain for 1 hour, pain down LEFT arm, some abdominal pain EXAM: CHEST - 2 VIEW COMPARISON:  07/05/2022 FINDINGS: Enlargement of cardiac silhouette. Mediastinal contours and pulmonary vascularity normal. Lungs clear. No pulmonary infiltrate, pleural effusion, or pneumothorax. Prior cervical spine fusion. IMPRESSION: No acute abnormalities. Mild enlargement of cardiac silhouette. Electronically Signed   By: Lavonia Dana M.D.   On: 07/19/2022 15:26    Procedures Procedures  {Document cardiac monitor, telemetry assessment procedure when appropriate:1}  Medications Ordered in ED Medications - No data to display  ED Course/ Medical Decision Making/ A&P           HEART Score: 6                Medical Decision Making  This patient presents to the ED for concern of chest pain, this involves an extensive number of treatment options, and is a complaint that carries with it a high risk of complications and morbidity.  The differential diagnosis includes but is not limited to acute coronary syndrome, aortic dissection, pulmonary embolism, pericarditis, pneumothorax, pneumonia, myocarditis, pleurisy, esophageal rupture    Comorbidities that complicate the patient evaluation: Patient's presentation is complicated by their history of hypertension, diabetes, obesity, cardiac family history  Social Determinants of Health: Patient's  obesity   increases the complexity of managing their presentation  Additional history obtained: Records reviewed  outpatient records reviewed  Lab Tests: I Ordered, and  personally interpreted labs.  The pertinent results include: Labs are overall unremarkable  Imaging Studies ordered: I ordered imaging studies including X-ray chest   I independently visualized and interpreted imaging which showed no acute findings I agree with the radiologist interpretation  Cardiac Monitoring: The patient was maintained on a cardiac monitor.  I personally viewed and interpreted the cardiac monitor which showed an underlying rhythm of:  sinus rhythm  Medicines ordered and prescription drug management: I ordered medication including ***  for ***  Reevaluation of the patient after these medicines showed that the patient    {resolved/improved/worsened:23923::"improved"}  Test Considered: Patient is low risk / negative by ***, therefore do not feel that *** is indicated.  Critical Interventions:  ***  Consultations Obtained: I requested consultation with the {consultation:26851}, and discussed  findings as well as pertinent plan - they recommend: ***  Reevaluation: After the interventions noted above, I reevaluated the patient and found that they have :{resolved/improved/worsened:23923::"improved"}  Complexity of problems addressed: Patient's presentation is most consistent with  {IZXY:81188}  Disposition: After consideration of the diagnostic results and the patient's response to treatment,  I feel that the patent would benefit from {disposition:26850}.     {Document critical care time when appropriate:1} {Document review of labs and clinical decision tools ie heart score, Chads2Vasc2 etc:1}  {Document your independent review of radiology images, and any outside records:1} {Document your discussion with family members, caretakers, and with consultants:1} {Document social determinants of health affecting pt's care:1} {Document your decision making why or why not admission, treatments were needed:1} Final Clinical Impression(s) / ED Diagnoses Final diagnoses:   None    Rx / DC Orders ED Discharge Orders     None

## 2022-07-19 NOTE — Telephone Encounter (Signed)
Received a fax regarding Prior Authorization from [employer] for [place]. Authorization has been DENIED because: There is no indication that patient's asthma diagnosis has been confirmed by evidence of pre-FEV1 < 80% or that patient was shown to have variable expiratory airflow obstruction documented by one of the following: increase of at least 12% and 262m in FEV1 after SABA admin, increase of at least 12% and 2088min FEV1 from basekine between visits or after 4 weeks of tratment, or positive exercise of bronchial challenge testing..  I do not see any PFTs on file or noted in chart. Routing to Dr. HuSilas Floodor advisement since liGlen Echoill need PFTs ordered and scheduled for patient to be approved  Can fax additional clinical documentation to [phone]: [phone] SalivaPharmD, MPH, BCPS, CPP Clinical Pharmacist (Rheumatology and Pulmonology)

## 2022-07-19 NOTE — ED Triage Notes (Addendum)
Pt BIBA from [facility]. Pt states CP x 1 hour and down her left arm, as well as abd pain. NSR.  Pt received 2 nitros PTA. 20 R AC. 324 ASA received

## 2022-07-19 NOTE — Telephone Encounter (Signed)
ATC patient to get her scheduled for PFTs per MH request. Mailbox was full. Will try again to get her scheduled for PFTs for Tezspire.

## 2022-07-19 NOTE — Telephone Encounter (Signed)
The problem is, in clinical practice, the presence of the criteria listed by [employer] do not rule in nor rule out asthma.  It is largely a clinical diagnosis.  Obviously, bronchodilator response is a strong indicator of the presence of asthma.  Crystal Marquez, can you please order pulmonary function tests next available.  Please let her know that her insurance company is denying the medication I would like to use for her asthma and insisting on additional testing prior to moving forward with this.  Thank you!

## 2022-07-19 NOTE — ED Provider Triage Note (Signed)
Emergency Medicine Provider Triage Evaluation Note  Crystal Marquez , a 61 y.o. female  was evaluated in triage.  Pt complains of chest pain radiating of the left arm since 12:30 PM.  Reports it feeling like "indigestion" though states it is unusual for it to radiate down the left arm.  Also reporting some diffuse upper abdominal pain.  States she was recently diagnosed with A-fib yesterday however is unable to see her cardiologist until next week.  Is concerned this may be related.  Denies fever, new shortness of breath, N/V/D, neck stiffness, or lower extremity weakness.  Received 2 nitros in route to ED with some improvement, however states it is starting to worsen again.  Review of Systems  Positive:  Negative: See above  Physical Exam  BP 132/64   Pulse 68   Temp 98.1 F (36.7 C) (Oral)   Resp 18   Ht '4\' 11"'$  (1.499 m)   Wt 78 kg   LMP  (LMP Unknown)   SpO2 99%   BMI 34.74 kg/m  Gen:   Awake, no distress   Resp:  Normal effort, equal chest rise, CTAB MSK:   Moves extremities without difficulty  Other:  Chest TTP just below the left clavicle, no bony deformity appreciated.  Upper and lower extremities appear neurovascular intact.  Abdomen soft, nondistended, with mild RUQ, epigastric, and LUQ tenderness.  No pulsatile mass appreciated.  Medical Decision Making  Medically screening exam initiated at 2:57 PM.  Appropriate orders placed.  Crystal Marquez was informed that the remainder of the evaluation will be completed by another provider, this initial triage assessment does not replace that evaluation, and the importance of remaining in the ED until their evaluation is complete.     Prince Rome, PA-C 32/99/24 1501

## 2022-07-20 ENCOUNTER — Other Ambulatory Visit: Payer: Self-pay

## 2022-07-20 ENCOUNTER — Observation Stay (HOSPITAL_BASED_OUTPATIENT_CLINIC_OR_DEPARTMENT_OTHER): Payer: Commercial Managed Care - HMO

## 2022-07-20 DIAGNOSIS — R079 Chest pain, unspecified: Secondary | ICD-10-CM

## 2022-07-20 DIAGNOSIS — I200 Unstable angina: Secondary | ICD-10-CM

## 2022-07-20 DIAGNOSIS — I5022 Chronic systolic (congestive) heart failure: Secondary | ICD-10-CM | POA: Diagnosis not present

## 2022-07-20 LAB — LIPID PANEL
Cholesterol: 122 mg/dL (ref 0–200)
HDL: 61 mg/dL (ref >40)
LDL Cholesterol: 47 mg/dL (ref 0–99)
Total CHOL/HDL Ratio: 2 RATIO
Triglycerides: 70 mg/dL (ref <150)
VLDL: 14 mg/dL (ref 0–40)

## 2022-07-20 LAB — BASIC METABOLIC PANEL
Anion gap: 15 (ref 5–15)
BUN: 16 mg/dL (ref 6–20)
CO2: 20 mmol/L — ABNORMAL LOW (ref 22–32)
Calcium: 9.4 mg/dL (ref 8.9–10.3)
Chloride: 108 mmol/L (ref 98–111)
Creatinine, Ser: 1.07 mg/dL — ABNORMAL HIGH (ref 0.44–1.00)
GFR, Estimated: 60 mL/min — ABNORMAL LOW (ref >60)
Glucose, Bld: 103 mg/dL — ABNORMAL HIGH (ref 70–99)
Potassium: 3.8 mmol/L (ref 3.5–5.1)
Sodium: 143 mmol/L (ref 135–145)

## 2022-07-20 LAB — ECHOCARDIOGRAM COMPLETE
AR max vel: 1.83 cm2
AV Area VTI: 1.86 cm2
AV Area mean vel: 1.73 cm2
AV Mean grad: 3 mmHg
AV Peak grad: 5.6 mmHg
Ao pk vel: 1.18 m/s
Area-P 1/2: 3.85 cm2
Calc EF: 49.3 %
Height: 59 in
MV M vel: 5.18 m/s
MV Peak grad: 107.3 mmHg
P 1/2 time: 592 msec
S' Lateral: 4.5 cm
Single Plane A2C EF: 54.4 %
Single Plane A4C EF: 47.4 %
Weight: 2752 oz

## 2022-07-20 LAB — HIV ANTIBODY (ROUTINE TESTING W REFLEX): HIV Screen 4th Generation wRfx: NONREACTIVE

## 2022-07-20 LAB — HEMOGLOBIN A1C
Hgb A1c MFr Bld: 5.8 % — ABNORMAL HIGH (ref 4.8–5.6)
Mean Plasma Glucose: 119.76 mg/dL

## 2022-07-20 LAB — TROPONIN I (HIGH SENSITIVITY)
Troponin I (High Sensitivity): 15 ng/L (ref <18)
Troponin I (High Sensitivity): 16 ng/L (ref <18)

## 2022-07-20 MED ORDER — LIDOCAINE 5 % EX PTCH
1.0000 | MEDICATED_PATCH | CUTANEOUS | Status: DC
  Administered 2022-07-20: 1 via TRANSDERMAL
  Filled 2022-07-20: qty 1

## 2022-07-20 MED ORDER — MONTELUKAST SODIUM 10 MG PO TABS: 10.0000 mg | ORAL_TABLET | Freq: Every day | ORAL | Status: DC

## 2022-07-20 MED ORDER — PREGABALIN 75 MG PO CAPS
150.0000 mg | ORAL_CAPSULE | Freq: Three times a day (TID) | ORAL | Status: DC
  Administered 2022-07-20: 150 mg via ORAL
  Filled 2022-07-20: qty 2

## 2022-07-20 MED ORDER — PERFLUTREN LIPID MICROSPHERE
1.0000 mL | INTRAVENOUS | Status: AC | PRN
  Administered 2022-07-20: 4 mL via INTRAVENOUS

## 2022-07-20 MED ORDER — ALPRAZOLAM 0.5 MG PO TABS: 0.5000 mg | ORAL_TABLET | Freq: Three times a day (TID) | ORAL | Status: DC | PRN

## 2022-07-20 MED ORDER — ACETAMINOPHEN 650 MG RE SUPP: 650.0000 mg | Freq: Four times a day (QID) | RECTAL | Status: DC | PRN

## 2022-07-20 MED ORDER — ROSUVASTATIN CALCIUM 5 MG PO TABS
5.0000 mg | ORAL_TABLET | Freq: Every day | ORAL | Status: DC
  Administered 2022-07-20: 5 mg via ORAL
  Filled 2022-07-20: qty 1

## 2022-07-20 MED ORDER — DULOXETINE HCL 60 MG PO CPEP
120.0000 mg | ORAL_CAPSULE | Freq: Every day | ORAL | Status: DC
  Administered 2022-07-20: 120 mg via ORAL
  Filled 2022-07-20: qty 2

## 2022-07-20 MED ORDER — HYDROXYCHLOROQUINE SULFATE 200 MG PO TABS
400.0000 mg | ORAL_TABLET | Freq: Every day | ORAL | Status: DC
  Administered 2022-07-20: 400 mg via ORAL
  Filled 2022-07-20: qty 2

## 2022-07-20 MED ORDER — ENOXAPARIN SODIUM 40 MG/0.4ML IJ SOSY
40.0000 mg | PREFILLED_SYRINGE | INTRAMUSCULAR | Status: DC
  Administered 2022-07-20: 40 mg via SUBCUTANEOUS
  Filled 2022-07-20: qty 0.4

## 2022-07-20 MED ORDER — NITROGLYCERIN 0.4 MG SL SUBL
0.4000 mg | SUBLINGUAL_TABLET | SUBLINGUAL | Status: DC | PRN
  Administered 2022-07-20: 0.4 mg via SUBLINGUAL
  Filled 2022-07-20: qty 1

## 2022-07-20 MED ORDER — SACUBITRIL-VALSARTAN 24-26 MG PO TABS
1.0000 | ORAL_TABLET | Freq: Two times a day (BID) | ORAL | Status: DC
  Administered 2022-07-20: 1 via ORAL
  Filled 2022-07-20 (×2): qty 1

## 2022-07-20 MED ORDER — ACETAMINOPHEN 325 MG PO TABS
650.0000 mg | ORAL_TABLET | Freq: Four times a day (QID) | ORAL | Status: DC | PRN
  Administered 2022-07-20 (×2): 650 mg via ORAL
  Filled 2022-07-20 (×2): qty 2

## 2022-07-20 MED ORDER — POLYETHYLENE GLYCOL 3350 17 G PO PACK: 17.0000 g | PACK | Freq: Every day | ORAL | Status: DC | PRN

## 2022-07-20 MED ORDER — LIDOCAINE 5 % EX PTCH: 1.0000 | MEDICATED_PATCH | CUTANEOUS | 0 refills | Status: DC

## 2022-07-20 MED ORDER — FERROUS SULFATE 325 (65 FE) MG PO TABS
325.0000 mg | ORAL_TABLET | Freq: Every day | ORAL | Status: DC
  Administered 2022-07-20: 325 mg via ORAL
  Filled 2022-07-20: qty 1

## 2022-07-20 MED ORDER — ALBUTEROL SULFATE (2.5 MG/3ML) 0.083% IN NEBU: 2.5000 mg | INHALATION_SOLUTION | Freq: Four times a day (QID) | RESPIRATORY_TRACT | Status: DC | PRN

## 2022-07-20 MED ORDER — DAPAGLIFLOZIN PROPANEDIOL 10 MG PO TABS
10.0000 mg | ORAL_TABLET | Freq: Every day | ORAL | Status: DC
  Administered 2022-07-20: 10 mg via ORAL
  Filled 2022-07-20: qty 1

## 2022-07-20 MED ORDER — FLUTICASONE FUROATE-VILANTEROL 200-25 MCG/ACT IN AEPB
1.0000 | INHALATION_SPRAY | Freq: Every day | RESPIRATORY_TRACT | Status: DC
  Filled 2022-07-20: qty 28

## 2022-07-20 MED ORDER — PANTOPRAZOLE SODIUM 40 MG PO TBEC
40.0000 mg | DELAYED_RELEASE_TABLET | Freq: Every day | ORAL | Status: DC
  Administered 2022-07-20: 40 mg via ORAL
  Filled 2022-07-20: qty 1

## 2022-07-20 NOTE — ED Notes (Signed)
Paged IM Nooruddin. On initial assessment pt denied CP. Pt called out to nurses station d/t sudden onset of chest pain. EKG obtained at that time. SL NTG x1 given. Pt initial CP 7/10, with improvement 3/10 after NTG.

## 2022-07-20 NOTE — ED Notes (Signed)
Report given and care endorsed to [facility] EMT-P

## 2022-07-20 NOTE — H&P (Signed)
Date: 07/20/2022               Patient Name:  Crystal Marquez MRN: [id number]  DOB: [birth year] Age / Sex: 61 y.o., female   PCP: Rhea Bleacher, NP         Medical Service: Internal Medicine Teaching Service         Attending Physician: Dr. Lucious Groves, DO    First Contact: Delene Ruffini, MD      Pager: Lysbeth Penner [phone]      Second Contact: Buddy Duty, DO      Pager: RA [phone]           After Hours (After 5p/  First Contact Pager: [phone]  weekends / holidays): Second Contact Pager: [phone]   SUBJECTIVE   Chief Complaint: Chest Pain  History of Present Illness:   Crystal Marquez is a 61 year old female with past medical history of asthma, Sjorgens Syndrome, coronary artery disease, essential hypertension, dyslipidemia, depression, anxiety, and diabetes mellitus who presents with chest pain. Pt reports this morning after walking to her chicken coop and back she started to develop chest pain underneath her left breast that radiated down her L arm. At the time, the pain was a 9/10, and was burning. She states the pain was similar to the pain she gets with her reflux, however usually that pain is located in the middle of her chest. She also was complaining of dyspnea accompanied with the chest pain. She has never had chest pain like this before. Of note, everytime patient becomes dypsneic she develops pressure in her chest located in the middle of sternum. She laid down, had no improvement of symptoms, so she went to her PCP office and they gave her nitroglycerin and aspirin 325, which seemed to relieve all symptoms. Pt is currently pain free. Of note, she presented to the ED on 10/18 with chest pain and no associated symptoms, was deemed to be MSK in nature and was sent home with ibuprofen.   She also states that for the past 2-3 months she has been requiring 2-3 pillows to sleep. She has also noticed her legs become more edematous, specifically the right one. She has a  history of Asthma, but states that walking around the house makes her short of breath, and she needs to rest for a few minutes before it goes away. She uses her albuterol inhaler which helps relieve the symptoms.   She also has newly diagnosed paroxysmal atrial fibrillation after being put on a Holter monitor for 24 hours. Her appointment with cardiology for evaluation is supposed to be on 07/28/22.   She endorses diarrhea, cold sweats for the last 3-4 months that occur once or twice a month. She denies recent fevers or cough.     ED Course: Troponins were flat, CBC, BMP all WNL. Chest X-Ray obtained no acute findings, and EKG revealed sinus rhythm.    Meds:  Vitamin D3 Folic Acid .'8mg'$  Ferrous Sulfate '325mg'$   Promethazine 12.'5mg'$  Miralax as needed  Senna as needed  Rosuvastatin '5mg'$   Magic Mouthwash with lidocaine  Entresto '24mg'$ -'26mg'$   Duloxetine '60mg'$   Albuterol  Metformin '500mg'$   Montelukast '10mg'$   Alprazolam .'5mg'$   Farxiga '10mg'$   Pantoprazole '40mg'$   Fexofenadine '180mg'$   Pregabalin '150mg'$   Hydroxychloroquine '200mg'$   KLOR-CON M20 Tablet  Past Medical History  Past Surgical History:  Procedure Laterality Date   ABDOMINAL HYSTERECTOMY     CARPAL TUNNEL RELEASE Left 2018   CERVICAL FUSION  10/07/2019   Anterior  Cervical Discectomy due to spinal stenosis   CESAREAN SECTION     COLONOSCOPY     UPPER GASTROINTESTINAL ENDOSCOPY      Social:  Lives With: Her husband Occupation: Network engineer but hasn't worked in 50 months Support: Husband lives with her Level of Function: Able to perform all ADLs and IADLs independently  PCP: Marcie Mowers, NP Substances: Denies cigarette use, alcohol use, marijuana, cocaine, and heroin use  Family History:   Mother: Asthma, Chronic Obstructive Pulmonary Disease Father: Atrial Fibrillation, Congestive Heart Failure, Myocardial Infarction at 59 y/o  Paternal Grandmother: MI (67-60 y/o), CVA Paternal Grandfather: MI (46's), Leukemia Siblings:   Sister 1: Atrial Fibrillation Chron's and Diverticulitis  Sister 2: Atrial Fibrillation  Brother: Lung Problems   Allergies: Allergies as of 07/19/2022 - Review Complete 07/19/2022  Allergen Reaction Noted   Lisinopril Shortness Of Breath 04/16/2021    Review of Systems: A complete ROS was negative except as per HPI.   OBJECTIVE:   Physical Exam: Blood pressure 127/62, pulse 81, temperature 98.5 F (36.9 C), resp. rate 18, height '4\' 11"'$  (1.499 m), weight 78 kg, SpO2 95 %.  Constitutional: well-appearing elderly woman, laying in bed, in no acute distress HENT: normocephalic atraumatic, mucous membranes moist Eyes: conjunctiva non-erythematous Neck: supple Cardiovascular: regular rate and rhythm, no m/r/g, trace edema on lower extremities bilaterally, +JVP at angle of mandible Pulmonary/Chest: normal work of breathing on room air, lungs clear to auscultation bilaterally Abdominal: soft, non-tender, non-distended MSK: normal bulk and tone Skin: warm and dry   Labs: CBC    Component Value Date/Time   WBC 8.5 07/19/2022 1509   RBC 5.28 (H) 07/19/2022 1509   HGB 15.5 (H) 07/19/2022 1509   HCT 47.8 (H) 07/19/2022 1509   PLT 173 07/19/2022 1509   MCV 90.5 07/19/2022 1509   MCH 29.4 07/19/2022 1509   MCHC 32.4 07/19/2022 1509   RDW 14.8 07/19/2022 1509   LYMPHSABS 1.6 03/25/2022 1102   MONOABS 0.6 03/25/2022 1102   EOSABS 0.1 03/25/2022 1102   BASOSABS 0.1 03/25/2022 1102     CMP     Component Value Date/Time   NA 141 07/19/2022 1509   NA 144 04/05/2022 0935   K 4.4 07/19/2022 1509   CL 108 07/19/2022 1509   CO2 22 07/19/2022 1509   GLUCOSE 99 07/19/2022 1509   BUN 19 07/19/2022 1509   BUN 18 04/05/2022 0935   CREATININE 0.95 07/19/2022 1509   CALCIUM 9.8 07/19/2022 1509   PROT 6.8 02/01/2022 1648   ALBUMIN 4.3 02/01/2022 1648   AST 25 02/01/2022 1648   ALT 22 02/01/2022 1648   ALKPHOS 40 02/01/2022 1648   BILITOT 0.5 02/01/2022 1648   GFRNONAA >60  07/19/2022 1509    Imaging: Chest X-Ray with no acute abnormality   EKG: personally reviewed my interpretation is normal sinus rhythm, no prolonged intervals, no ST wave changes no axis deviation, similar to previous EKG.   ASSESSMENT & PLAN:   Assessment & Plan by Problem: Principal Problem:   Chest pain   Crystal Marquez is a 61 y.o. person living with a history of asthma, Sjorgens Syndrome, coronary artery disease, essential hypertension, dyslipidemia, depression, anxiety, and diabetes mellitus who presented with chest pain and admitted for chest pain workup on hospital day 0  #Stable Angina Pt previously presented to the emergency department complaining of chest pain on 10/18, which was deemed to be MSK in nature and was sent home with ibuprofen. She presents again with worse chest pain,  and now associated symptoms of SOB during the episode. At this point, since troponins are negative and EKG is reassuring, low risk for ACS. Heart score is 3, which correlates with a .6% chance of MI within the next six weeks. Stress test done in February of this year revealed no ischemia and no prior myocardial infarction. Overall low risk patient for ACS, however considering the recurrent nature of these symptoms, will need to watch over night and if symptoms re-present. Vitals stable within normal limits.   While low risk for ACS, given new symptoms of fluid overload, new diagnosis of Atrial fibrillation, and frequency of anginal pain, echo will help evaluate if HF has worsened as well as help rule out any ischemic changes.   Plan:  - Nitroglycerin PRN (RN will notify physician if administered)  - Echo pending to look for regional wall abnormalities  - F/U Lipid Panel, TSH  #HFmrEF Pt's last echo was done in July which showed an EF of 40-45% with mildly decreased function. She states for the past 2-3 months she has required two-three pillows to sleep due to shortness of breath while laying flat. On  exam, JVP was positive and there was trace edema of the bilateral lower extremities. She denies any weight gain, but feels her abdomen has been distended recently. At home, she takes Entresto '24mg'$ -'26mg'$  and Farxiga '10mg'$ . Chest X-Ray did not show pulmonary edema.  Plan:  -Echo Pending  -Continue Entresto and Farxiga   #Newly Diagnosed Atrial Fibrillation Pt has been feeling flutters in her chest for the past few months, and was given a Holter monitor by PCP to evaluate symptoms. Holter monitor revealed Afib per patient, and she has an appointment to see cardiology on 11/9 for evaluation. Monitor during encounter was normal sinus rhythm. CHADVASC Score is 4, corresponding with a 6.7% chance of CVA within the next year. There is no documentation per chart review that this is a definitive diagnosis, and tele has been sinus rhythm so far. She also is not on any anti-coagulation.  Plan:  - Continue cardiac monitoring  - F/U Outpatient with PCP to discuss anti-coagulation - F/U TSH  #Severe Persistent Asthma Pt has had difficult time finding an inhaler that works effectively. Since she was diagnosed with COVID in October, asthma has been much more difficult to control She was trialed on Trelegy, which helped immensely, however insurance did not cover it. She follows closely with [facility], and per last note on 10/10, seems like next step was Tezspire, a biologic. Her current inhaler at home is Albuterol, and she also takes montelukast. Will start her on Breao Ellipta and restart montelukast '10mg'$ .   Plan:  - Breo-Ellipta 200-45mg, one puff daily  - Montelukast '10mg'$  - Inquiry about Tezspire  #Hx of Anxiety and Depression  Pt takes Duloxetine 60 BID at home, as well as xanax '5mg'$  as needed. Will schedule Duloxetine and put Xanax as PRN.   #Hyperlipidiemia  Will continue home crestor '5mg'$   #GERD  Will continue protonix '40mg'$   #Sjorgen Syndrome Pt takes hydroxychloroquine '400mg'$  for  management, will continue.    Diet: Normal VTE: Enoxaparin IVF: None,None Code: Full  Prior to Admission Living Arrangement: Home, living   Anticipated Discharge Location: Home   Dispo: Admit patient to Observation with expected length of stay less than 2 midnights.  Signed: SDrucie Opitz MD Pager: [phone] Medicine Resident PGY-1  07/20/2022, 12:24 AM

## 2022-07-20 NOTE — Hospital Course (Addendum)
Seen in ED, heart skipping a beat.   Saw PCP for sinus infection. Put on heart monitor for a week. Evidence of afib on Holter monitor. PCP contacted her heart doctor. Was supposed to have an appointment next week with her heart doctor.   This morning she woke up and began having chest and arm pain. She described the pain as acid reflux. Pain was bad enough to where she went to her PCP. Her PCP did an EKG and sent the patient to the ED.   Never had similar pain before. The pain is under her left breast and into the left axilla.   Sometimes feel like elephant is sitting on center of chest.   She has a flutter in the center of her chest.     PCP gave her two nitroglycerins which improved the pain.   Chest pain this morning moved down her left arm to her elbow. Pain is not as bad but has some sorenss in the left arm. No chest pain at the moment.   When chest pain started she was getting eggs out of chicken coop and made her bed. She began having pain and laid down.   For the last few months she has had increasing fatigue but she is unsure as to why.   Associated sx of dyspnea. Pain and dyspnea stayed until she had the nitroglycerin in at her PCP office. Felt like she had to vomit. Some productive cough with phlegm. Denies leg swelling. Has orthopnea (2-3 pillow) for past 2-3 months. When lies flat feels fluttering in chest and shortness of breath more. These feelings made her anxious because she didn't know what these were. Denies fevers. Has had cold sweats 3-4 months. **** Diarrhea and constipation. Last BM yesterday  PMHx of asthma, heart failure, sjogrens, fM, HLD, HTN, T2DM, anxiety/depression.   No history of cancer, stroke, MI.  No histoyr of cardiac catheterization.   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 No other substance use  Allergy to lisinopril - swelling face throat  tongue  Family history Mother COPD asthma Father afib congestive heart failure, MI 59 y/o  Paternal Grandmother MI (18-60 y/o) CVA Paternal Grandfather MI (40's) Leukemia Both sisters afib, Crohn's and diverticulitis Brother lung problems   11/1: patient still having chest pain and stating that it is better but not gone. She states "something is wrong with my heart but I don't know what". Discussed that not concerned about ACS.  - Still endorsing heart palpitations periodically, not associated with dizziness or pain.  - PVC/PAC noted on monitor in room - chest wall non-tender to palpation - bilateral LE edema, L>R

## 2022-07-20 NOTE — ED Notes (Signed)
Report received from previous RN. Pt currently sleeping comfortably.

## 2022-07-20 NOTE — Progress Notes (Signed)
  Echocardiogram 2D Echocardiogram has been performed.  Crystal Marquez 07/20/2022, 10:58 AM

## 2022-07-20 NOTE — Discharge Summary (Signed)
Name: Crystal Marquez MRN: [id number] DOB: 1961-09-10 61 y.o. PCP: Rhea Bleacher, NP  Date of Admission: 07/19/2022  2:15 PM Date of Discharge: 07/20/22 Attending Physician: Dr. Angelia Mould  Discharge Diagnosis: Principal Problem:   Chest pain Active Problems:   Unstable angina ([place])   Chronic systolic congestive heart failure [facility])    Discharge Medications: Allergies as of 07/20/2022       Reactions   Lisinopril Shortness Of Breath   Bloating         Medication List     TAKE these medications    albuterol 108 (90 Base) MCG/ACT inhaler Commonly known as: VENTOLIN HFA Inhale 1 puff into the lungs every 6 (six) hours as needed for wheezing or shortness of breath.   albuterol (2.5 MG/3ML) 0.083% nebulizer solution Commonly known as: PROVENTIL Take 2.5 mg by nebulization every 6 (six) hours as needed for wheezing or shortness of breath.   ALPRAZolam 0.5 MG tablet Commonly known as: XANAX Take 0.5 mg by mouth 3 (three) times daily as needed for anxiety.   [facility] Senna 8.6 MG tablet Generic drug: senna TAKE 1 TABLET (8.6 MG TOTAL) BY MOUTH EVERY OTHER DAY. What changed: See the new instructions.   DULoxetine 60 MG capsule Commonly known as: CYMBALTA Take 120 mg by mouth daily.   Entresto 24-26 MG Generic drug: sacubitril-valsartan Take 1 tablet by mouth 2 (two) times daily.   Farxiga 10 MG Tabs tablet Generic drug: dapagliflozin propanediol Take 10 mg by mouth daily.   ferrous sulfate 325 (65 FE) MG tablet Take 325 mg by mouth daily with breakfast.   fexofenadine 180 MG tablet Commonly known as: ALLEGRA Take 180 mg by mouth daily.   fluticasone-salmeterol 500-50 MCG/ACT Aepb Commonly known as: Wixela Inhub Inhale 1 puff into the lungs in the morning and at bedtime.   Folic Acid 0.8 MG Caps Take 2 capsules by mouth daily.   hydroxychloroquine 200 MG tablet Commonly known as: PLAQUENIL Take 400 mg by mouth daily.   Incruse Ellipta 62.5 MCG/ACT  Aepb Generic drug: umeclidinium bromide Inhale 1 puff into the lungs daily.   lidocaine 5 % Commonly known as: LIDODERM Place 1 patch onto the skin daily. Remove & Discard patch within 12 hours or as directed by MD Start taking on: July 21, 2022   metFORMIN 500 MG tablet Commonly known as: GLUCOPHAGE Take 500 mg by mouth 2 (two) times daily with a meal.   methocarbamol 500 MG tablet Commonly known as: ROBAXIN Take 1 tablet (500 mg total) by mouth 4 (four) times daily. What changed:  when to take this reasons to take this   montelukast 10 MG tablet Commonly known as: SINGULAIR Take 10 mg by mouth at bedtime.   nystatin powder Generic drug: nystatin Apply 1 application  topically daily as needed (under breast/folds of skin).   pantoprazole 40 MG tablet Commonly known as: PROTONIX Take 40 mg by mouth daily.   polyethylene glycol 17 g packet Commonly known as: MIRALAX / GLYCOLAX Take 17 g by mouth daily as needed for mild constipation.   potassium chloride SA 20 MEQ tablet Commonly known as: KLOR-CON M Take 20 mEq by mouth daily.   pregabalin 150 MG capsule Commonly known as: LYRICA Take 150 mg by mouth 3 (three) times daily.   rosuvastatin 5 MG tablet Commonly known as: CRESTOR Take 5 mg by mouth daily.   triamcinolone 0.1 % paste Commonly known as: KENALOG Use as directed 1 Application in the mouth  or throat 2 (two) times daily.   Vitamin D3 125 MCG (5000 UT) Caps Take 5,000 Units by mouth daily.        Disposition and follow-up:   Ms.Nigeria Hinshaw was discharged from [facility] in Stable condition.  At the hospital follow up visit please address:  1.  Follow-up:  a. Atypical chest pain - negative troponins, EKG. Some PVCs seen on tele, but low burden. Resolved at time of discharge. Has cardiology f/u on 11/9.     b. HFrEF - ECHO with EF 35%. Decreased compared to prior. Having some orthopnea, but close to euvolemic on exam. Consider  increasing Entresto and/or [place]. Follow up with cardiology on 11/9.    c. Possible Hx afib - not seen on tele. F/u outpatient. Consider checking tsh.    d.  2.  Labs / imaging needed at time of follow-up: BMP  3.  Pending labs/ test needing follow-up: none  4.  Medication Changes  Started: none  Stopped: none  Changed: none  Follow-up Appointments:  Follow-up Information     Rhea Bleacher, NP. Schedule an appointment as soon as possible for a visit in 1 week(s).   Contact information: [address] [place] [address] [phone]               cardiology  Hospital Course by problem list:   Atypical chest pain - patient presented with left substernal chest pain radiating down left arm. Also noted orthopnea. Workup included EKG which showed no signs of ischemia and her troponins were negative and flat. She underwent ECHO which showed EF slightly reduced compared to prior, but no regional wall motion abnormalities. Telemetry showed some PVCs but was otherwise sinus. Chest pain not reproducible with palpation, but had resolved. Heart was noted to be regular rate and rhythm with only trace pedal edema but otherwise appeared to be close to euvolemia. Felt to be low concern for ACS. Etiology of CP uncertain, but felt stable for discharge home.   Discharge Subjective: CP has resolved. Able to ambulate without recurrence of symptoms. Still having some orthopnea.   Discharge Exam:   BP 128/82 (BP Location: Left Arm)   Pulse 79   Temp 98.3 F (36.8 C) (Oral)   Resp 19   Ht '4\' 11"'$  (1.499 m)   Wt 77.8 kg   LMP  (LMP Unknown)   SpO2 100%   BMI 34.64 kg/m  Constitutional: well-appearing female sitting in bed, in no acute distress Cardiovascular: regular rate and rhythm, no m/r/g, no JVD, trace edema bilateral lower extremities Pulmonary/Chest: normal work of breathing on room air, lungs clear to auscultation bilaterally Abdominal: soft, non-tender, non-distended MSK:  normal bulk and tone Neurological: alert & oriented  Skin: warm and dry Psych: mood and affect appropriate   Pertinent Labs, Studies, and Procedures:     Latest Ref Rng & Units 07/19/2022    3:09 PM 07/05/2022    7:49 PM 03/25/2022   11:02 AM  CBC  WBC 4.0 - 10.5 K/uL 8.5  9.5  8.1   Hemoglobin 12.0 - 15.0 g/dL 15.5  15.4  15.5   Hematocrit 36.0 - 46.0 % 47.8  47.8  47.4   Platelets 150 - 400 K/uL 173  184  156.0        Latest Ref Rng & Units 07/20/2022    4:59 AM 07/19/2022    3:09 PM 07/05/2022    7:49 PM  CMP  Glucose 70 - 99  mg/dL 103  99  105   BUN 6 - 20 mg/dL '16  19  15   '$ Creatinine 0.44 - 1.00 mg/dL 1.07  0.95  0.97   Sodium 135 - 145 mmol/L 143  141  140   Potassium 3.5 - 5.1 mmol/L 3.8  4.4  4.3   Chloride 98 - 111 mmol/L 108  108  108   CO2 22 - 32 mmol/L '20  22  25   '$ Calcium 8.9 - 10.3 mg/dL 9.4  9.8  9.5     ECHOCARDIOGRAM COMPLETE  Result Date: 07/20/2022    ECHOCARDIOGRAM REPORT   Patient Name:   Omar Person Date of Exam: 07/20/2022 Medical Rec #:  [id number]   Height:       59.0 in Accession #:    [id number]  Weight:       172.0 lb Date of Birth:  [birth year]    BSA:          1.730 m Patient Age:    42 years    BP:           143/78 mmHg Patient Gender: F           HR:           66 bpm. Exam Location:  Inpatient Procedure: 2D Echo and Intracardiac Opacification Agent Indications:    chest pain  History:        Patient has prior history of Echocardiogram examinations, most                 recent 04/07/2022. Risk Factors:Dyslipidemia and Hypertension.  Sonographer:    Harvie Junior Referring Phys: [id number] ERIK C HOFFMAN  Sonographer Comments: Technically difficult study due to poor echo windows, no subcostal window and patient is obese. Image acquisition challenging due to patient body habitus. IMPRESSIONS  1. Left ventricular ejection fraction, by estimation, is 30%. The left ventricle has moderate to severely decreased function. The left ventricle demonstrates global  hypokinesis. The left ventricular internal cavity size was mildly dilated. Left ventricular diastolic parameters are consistent with Grade I diastolic dysfunction (impaired relaxation).  2. Right ventricular systolic function is normal. The right ventricular size is normal. Tricuspid regurgitation signal is inadequate for assessing PA pressure.  3. Left atrial size was mild to moderately dilated.  4. The mitral valve is normal in structure. Trivial mitral valve regurgitation. No evidence of mitral stenosis.  5. The aortic valve is tricuspid. Aortic valve regurgitation is mild. No aortic stenosis is present.  6. IVC not visualized. FINDINGS  Left Ventricle: Left ventricular ejection fraction, by estimation, is 30%. The left ventricle has moderate to severely decreased function. The left ventricle demonstrates global hypokinesis. Definity contrast agent was given IV to delineate the left ventricular endocardial borders. The left ventricular internal cavity size was mildly dilated. There is no left ventricular hypertrophy. Left ventricular diastolic parameters are consistent with Grade I diastolic dysfunction (impaired relaxation). Right Ventricle: The right ventricular size is normal. No increase in right ventricular wall thickness. Right ventricular systolic function is normal. Tricuspid regurgitation signal is inadequate for assessing PA pressure. Left Atrium: Left atrial size was mild to moderately dilated. Right Atrium: Right atrial size was normal in size. Pericardium: There is no evidence of pericardial effusion. Mitral Valve: The mitral valve is normal in structure. Trivial mitral valve regurgitation. No evidence of mitral valve stenosis. Tricuspid Valve: The tricuspid valve is normal in structure. Tricuspid valve regurgitation is trivial. Aortic Valve: The aortic valve is tricuspid.  Aortic valve regurgitation is mild. Aortic regurgitation PHT measures 592 msec. No aortic stenosis is present. Aortic valve mean  gradient measures 3.0 mmHg. Aortic valve peak gradient measures 5.6 mmHg. Aortic  valve area, by VTI measures 1.86 cm. Pulmonic Valve: The pulmonic valve was normal in structure. Pulmonic valve regurgitation is not visualized. Aorta: The aortic root is normal in size and structure. Venous: The inferior vena cava was not well visualized. IAS/Shunts: No atrial level shunt detected by color flow Doppler.  LEFT VENTRICLE PLAX 2D LVIDd:         5.50 cm      Diastology LVIDs:         4.50 cm      LV e' medial:    4.90 cm/s LV PW:         0.90 cm      LV E/e' medial:  21.0 LV IVS:        1.00 cm      LV e' lateral:   6.53 cm/s LVOT diam:     2.10 cm      LV E/e' lateral: 15.8 LV SV:         43 LV SV Index:   25 LVOT Area:     3.46 cm  LV Volumes (MOD) LV vol d, MOD A2C: 100.0 ml LV vol d, MOD A4C: 134.0 ml LV vol s, MOD A2C: 45.6 ml LV vol s, MOD A4C: 70.5 ml LV SV MOD A2C:     54.4 ml LV SV MOD A4C:     134.0 ml LV SV MOD BP:      59.2 ml RIGHT VENTRICLE RV Basal diam:  3.20 cm RV Mid diam:    2.60 cm RV S prime:     12.50 cm/s TAPSE (M-mode): 2.0 cm LEFT ATRIUM             Index        RIGHT ATRIUM           Index LA diam:        3.70 cm 2.14 cm/m   RA Area:     11.40 cm LA Vol (A2C):   68.1 ml 39.37 ml/m  RA Volume:   27.00 ml  15.61 ml/m LA Vol (A4C):   69.8 ml 40.35 ml/m LA Biplane Vol: 70.2 ml 40.58 ml/m  AORTIC VALVE                    PULMONIC VALVE AV Area (Vmax):    1.83 cm     PV Vmax:       1.04 m/s AV Area (Vmean):   1.73 cm     PV Peak grad:  4.3 mmHg AV Area (VTI):     1.86 cm AV Vmax:           118.00 cm/s AV Vmean:          79.900 cm/s AV VTI:            0.229 m AV Peak Grad:      5.6 mmHg AV Mean Grad:      3.0 mmHg LVOT Vmax:         62.20 cm/s LVOT Vmean:        39.900 cm/s LVOT VTI:          0.123 m LVOT/AV VTI ratio: 0.54 AI PHT:            592 msec  AORTA Ao Root diam: 3.00 cm Ao Asc  diam:  2.70 cm MITRAL VALVE                TRICUSPID VALVE MV Area (PHT): 3.85 cm     TR Peak grad:   11.2  mmHg MV Decel Time: 197 msec     TR Vmax:        167.00 cm/s MR Peak grad: 107.3 mmHg MR Vmax:      518.00 cm/s   SHUNTS MV E velocity: 103.00 cm/s  Systemic VTI:  0.12 m MV A velocity: 130.00 cm/s  Systemic Diam: 2.10 cm MV E/A ratio:  0.79 Dalton McleanMD Electronically signed by Franki Monte Signature Date/Time: 07/20/2022/2:34:57 PM    Final    DG Chest 2 View  Result Date: 07/19/2022 CLINICAL DATA:  Chest pain for 1 hour, pain down LEFT arm, some abdominal pain EXAM: CHEST - 2 VIEW COMPARISON:  07/05/2022 FINDINGS: Enlargement of cardiac silhouette. Mediastinal contours and pulmonary vascularity normal. Lungs clear. No pulmonary infiltrate, pleural effusion, or pneumothorax. Prior cervical spine fusion. IMPRESSION: No acute abnormalities. Mild enlargement of cardiac silhouette. Electronically Signed   By: Lavonia Dana M.D.   On: 07/19/2022 15:26     Discharge Instructions: Discharge Instructions     Call MD for:  difficulty breathing, headache or visual disturbances   Complete by: As directed    Call MD for:  extreme fatigue   Complete by: As directed    Call MD for:  hives   Complete by: As directed    Call MD for:  persistant dizziness or light-headedness   Complete by: As directed    Call MD for:  persistant nausea and vomiting   Complete by: As directed    Call MD for:  redness, tenderness, or signs of infection (pain, swelling, redness, odor or green/yellow discharge around incision site)   Complete by: As directed    Call MD for:  severe uncontrolled pain   Complete by: As directed    Call MD for:  temperature >100.4   Complete by: As directed    Diet - low sodium heart healthy   Complete by: As directed    Increase activity slowly   Complete by: As directed        Signed: Delene Ruffini, MD 07/20/2022, 5:26 PM   Pager: [phone]

## 2022-07-20 NOTE — Plan of Care (Signed)

## 2022-07-20 NOTE — Telephone Encounter (Signed)
Called patient and went over how [place] is needing additional testing for her [place] due to [place]. She verbalized understanding. Got her scheduled for   Pulmonary Function Testing 08/15/2022 Monday At 4pm  Results will be called to patient once [place] reviews them. Nothing further needed

## 2022-07-20 NOTE — Progress Notes (Signed)
Evaluated at bedside after notified by RN patient had left sided chest pain resolved with nitroglycerin.   Patient resting in bed comfortably, BP and HR within normal range. She states the pain was a sharp pain under the left breast. The pain was reproducible with palpation. Low suspicion for cardiac pain. Will DC PRN nitroglycerin and add lidocaine patch for continued pain.

## 2022-07-20 NOTE — Discharge Instructions (Addendum)
Dear Crystal Marquez,  Thank you for trusting [place] with your care. We evaluated you in the hospital for chest pain. We did not see any damage to your heart. We did notice that it is pumping slightly less well compared to before. It will be important to follow up with the cardiologist on the 9th. We also recommend that you follow up with your primary care doctor in 1-2 weeks as well.

## 2022-07-26 ENCOUNTER — Other Ambulatory Visit: Payer: Self-pay | Admitting: Physician Assistant

## 2022-07-28 ENCOUNTER — Ambulatory Visit: Payer: Commercial Managed Care - HMO | Attending: Cardiology | Admitting: Cardiology

## 2022-07-28 ENCOUNTER — Encounter: Payer: Self-pay | Admitting: Cardiology

## 2022-07-28 VITALS — BP 130/70 | HR 81 | Ht 59.0 in | Wt 171.2 lb

## 2022-07-28 DIAGNOSIS — E785 Hyperlipidemia, unspecified: Secondary | ICD-10-CM

## 2022-07-28 DIAGNOSIS — R072 Precordial pain: Secondary | ICD-10-CM | POA: Diagnosis not present

## 2022-07-28 DIAGNOSIS — E119 Type 2 diabetes mellitus without complications: Secondary | ICD-10-CM

## 2022-07-28 DIAGNOSIS — I2510 Atherosclerotic heart disease of native coronary artery without angina pectoris: Secondary | ICD-10-CM | POA: Diagnosis not present

## 2022-07-28 DIAGNOSIS — I480 Paroxysmal atrial fibrillation: Secondary | ICD-10-CM

## 2022-07-28 DIAGNOSIS — I420 Dilated cardiomyopathy: Secondary | ICD-10-CM

## 2022-07-28 MED ORDER — METOPROLOL TARTRATE 100 MG PO TABS: 100.0000 mg | ORAL_TABLET | Freq: Once | ORAL | 0 refills | Status: DC

## 2022-07-28 MED ORDER — APIXABAN 5 MG PO TABS: 5.0000 mg | ORAL_TABLET | Freq: Two times a day (BID) | ORAL | 5 refills | Status: DC

## 2022-07-28 NOTE — Progress Notes (Signed)
Cardiology Office Note:    Date:  07/28/2022   ID:  Crystal Marquez, DOB 1961/08/17, MRN [id number]  PCP:  Rhea Bleacher, NP  Cardiologist:  Jenne Campus, MD    Referring MD: Rhea Bleacher, NP   Chief Complaint  Patient presents with   Results    Echo    History of Present Illness:    Crystal Marquez is a 61 y.o. female with past medical history significant for essential hypertension, dyslipidemia, asthma, anxiety she did have echocardiogram done few months ago because of shortness of breath she was found to have ejection fraction 40%, after that stress test has been performed which showed no evidence of ischemia then recently she ended up coming to the hospital shortness of breath as well as chest pain.  She ruled out for myocardial infarction, echocardiogram repeated showed decreasing left ventricle ejection fraction to 30%.  She is being discharged home and she requested to be follow-up with [place].  Also her primary care physician put monitor on her because of palpitations monitor showed episodes of atrial fibrillation.  She still complain of having some palpitations sometimes.  Since the time of discharge from hospital sometimes she will get chest pain at different situations sometimes at rest sometimes with exercise.  Past Medical History:  Diagnosis Date   Age-related osteoporosis without current pathological fracture    Allergic rhinitis    Anxiety    Asthma    B12 deficiency 05/06/2020   Benign essential HTN 08/29/2019   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   Chronic kidney disease    Chronic pain syndrome    Class 2 obesity due to excess calories without serious comorbidity with body mass index (BMI) of 36.0 to 36.9 in adult 08/29/2019   Colitis 05/06/2020   Colon polyp    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[place]) 05/06/2020   Coronary atherosclerosis 10/06/2021   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   Dyslipidemia 08/29/2019   Dyspnea on exertion 10/06/2021    Fibromyalgia 09/30/2021   GERD (gastroesophageal reflux disease)    Hypercalcemia    Hypertension    Iron deficiency anemia due to chronic blood loss 05/06/2020   Low back pain 11/11/2021   Major depressive disorder, single episode    Mild intermittent asthma without complication 27/11/5007   Myopathy    Neck pain 11/11/2021   Neuropathy    Pain in right hand 08/09/2018   Polyarthralgia 09/30/2021   Reflux esophagitis    Sjogren syndrome, unspecified ([place]) 09/30/2021   Sjogren's disease ([place])    Spinal stenosis of lumbar region 12/13/2017   Tenosynovitis of left hand 08/09/2018   Type 2 diabetes mellitus without complication, without long-term current use of insulin ([place]) 08/29/2019   Unilateral primary osteoarthritis, right knee 11/03/2021    Past Surgical History:  Procedure Laterality Date   ABDOMINAL HYSTERECTOMY     CARPAL TUNNEL RELEASE Left 2018   CERVICAL FUSION  10/07/2019   Anterior Cervical Discectomy due to spinal stenosis   CESAREAN SECTION     COLONOSCOPY     UPPER GASTROINTESTINAL ENDOSCOPY      Current Medications: Current Meds  Medication Sig   albuterol (PROVENTIL) (2.5 MG/3ML) 0.083% nebulizer solution Take 2.5 mg by nebulization every 6 (six) hours as needed for wheezing or shortness of breath.   albuterol (VENTOLIN HFA) 108 (90 Base) MCG/ACT inhaler Inhale 1 puff into the lungs every 6 (six) hours as needed for wheezing or shortness of breath.   ALPRAZolam (XANAX) 0.5 MG  tablet Take 0.5 mg by mouth 3 (three) times daily as needed for anxiety.   apixaban (ELIQUIS) 5 MG TABS tablet Take 1 tablet (5 mg total) by mouth 2 (two) times daily.   Cholecalciferol (VITAMIN D3) 125 MCG (5000 UT) CAPS Take 5,000 Units by mouth daily.   [employer] SENNA 8.6 MG tablet TAKE 1 TABLET (8.6 MG TOTAL) BY MOUTH EVERY OTHER DAY. (Patient taking differently: Take 1 tablet by mouth daily as needed for constipation.)   DULoxetine (CYMBALTA) 60 MG capsule Take 120 mg by mouth daily.    FARXIGA 10 MG TABS tablet Take 10 mg by mouth daily.   ferrous sulfate 325 (65 FE) MG tablet Take 325 mg by mouth daily with breakfast.   fexofenadine (ALLEGRA) 180 MG tablet Take 180 mg by mouth daily.   fluticasone-salmeterol (WIXELA INHUB) 500-50 MCG/ACT AEPB Inhale 1 puff into the lungs in the morning and at bedtime.   Folic Acid 0.8 MG CAPS Take 2 capsules by mouth daily.   hydroxychloroquine (PLAQUENIL) 200 MG tablet Take 400 mg by mouth daily.   lidocaine (LIDODERM) 5 % Place 1 patch onto the skin daily. Remove & Discard patch within 12 hours or as directed by MD   metFORMIN (GLUCOPHAGE) 500 MG tablet Take 500 mg by mouth 2 (two) times daily with a meal.   methocarbamol (ROBAXIN) 500 MG tablet TAKE 1 TABLET BY MOUTH 4 TIMES DAILY.   metoprolol tartrate (LOPRESSOR) 100 MG tablet Take 1 tablet (100 mg total) by mouth once for 1 dose. Take 2 hours prior to CT   montelukast (SINGULAIR) 10 MG tablet Take 10 mg by mouth at bedtime.   NYSTATIN powder Apply 1 application  topically daily as needed (under breast/folds of skin).   pantoprazole (PROTONIX) 40 MG tablet Take 40 mg by mouth daily.   polyethylene glycol (MIRALAX / GLYCOLAX) 17 g packet Take 17 g by mouth daily as needed for mild constipation.   potassium chloride SA (K-DUR,KLOR-CON) 20 MEQ tablet Take 20 mEq by mouth daily.   pregabalin (LYRICA) 150 MG capsule Take 150 mg by mouth 3 (three) times daily.   rosuvastatin (CRESTOR) 5 MG tablet Take 5 mg by mouth daily.   sacubitril-valsartan (ENTRESTO) 24-26 MG Take 1 tablet by mouth 2 (two) times daily.   triamcinolone (KENALOG) 0.1 % paste Use as directed 1 Application in the mouth or throat 2 (two) times daily.   umeclidinium bromide (INCRUSE ELLIPTA) 62.5 MCG/ACT AEPB Inhale 1 puff into the lungs daily.     Allergies:   Lisinopril   Social History   Socioeconomic History   Marital status: Married    Spouse name: Not on file   Number of children: Not on file   Years of  education: Not on file   Highest education level: Not on file  Occupational History   Not on file  Tobacco Use   Smoking status: Never   Smokeless tobacco: Never  Vaping Use   Vaping Use: Never used  Substance and Sexual Activity   Alcohol use: No    Alcohol/week: 0.0 standard drinks of alcohol   Drug use: No   Sexual activity: Not on file  Other Topics Concern   Not on file  Social History Narrative   Not on file   Social Determinants of Health   Financial Resource Strain: Not on file  Food Insecurity: No Food Insecurity (07/20/2022)   Hunger Vital Sign    Worried About Running Out of Food in the Last  Year: Never true    [place] in the Last Year: Never true  Transportation Needs: No Transportation Needs (07/20/2022)   PRAPARE - Hydrologist (Medical): No    Lack of Transportation (Non-Medical): No  Physical Activity: Not on file  Stress: Not on file  Social Connections: Not on file     Family History: The patient's family history includes Asthma in her mother; COPD in her mother; Colon polyps in her father and sister; Crohn's disease in her sister; Depression in her father; Diabetes in her father and mother; Diverticulitis in her sister; Heart disease in her father, maternal grandmother, and paternal grandmother; Hypercholesterolemia in her sister; Hypertension in her mother; Kidney disease in her brother and mother; Skin cancer in her father. There is no history of Colon cancer, Rectal cancer, or Stomach cancer. ROS:   Please see the history of present illness.    All 14 point review of systems negative except as described per history of present illness  EKGs/Labs/Other Studies Reviewed:      Recent Labs: 02/01/2022: ALT 22 07/19/2022: Hemoglobin 15.5; Platelets 173 07/20/2022: BUN 16; Creatinine, Ser 1.07; Potassium 3.8; Sodium 143  Recent Lipid Panel    Component Value Date/Time   CHOL 122 07/20/2022 0459   TRIG 70 07/20/2022  0459   HDL 61 07/20/2022 0459   CHOLHDL 2.0 07/20/2022 0459   VLDL 14 07/20/2022 0459   LDLCALC 47 07/20/2022 0459    Physical Exam:    VS:  BP 130/70 (BP Location: Left Arm, Patient Position: Sitting)   Pulse 81   Ht '4\' 11"'$  (1.499 m)   Wt 171 lb 3.2 oz (77.7 kg)   LMP  (LMP Unknown)   SpO2 98%   BMI 34.58 kg/m     Wt Readings from Last 3 Encounters:  07/28/22 171 lb 3.2 oz (77.7 kg)  07/20/22 171 lb 8.3 oz (77.8 kg)  07/05/22 172 lb (78 kg)     GEN:  Well nourished, well developed in no acute distress HEENT: Normal NECK: No JVD; No carotid bruits LYMPHATICS: No lymphadenopathy CARDIAC: RRR, no murmurs, no rubs, no gallops RESPIRATORY:  Clear to auscultation without rales, wheezing or rhonchi  ABDOMEN: Soft, non-tender, non-distended MUSCULOSKELETAL:  No edema; No deformity  SKIN: Warm and dry LOWER EXTREMITIES: no swelling NEUROLOGIC:  Alert and oriented x 3 PSYCHIATRIC:  Normal affect   ASSESSMENT:    1. Precordial pain   2. Paroxysmal atrial fibrillation (HCC)   3. Dilated cardiomyopathy (HCC) ejection fraction percent based on done in November 2 023   4. Atherosclerosis of native coronary artery of native heart without angina pectoris   5. Type 2 diabetes mellitus without complication, without long-term current use of insulin ([place])   6. Dyslipidemia    PLAN:    In order of problems listed above:  Precordial chest pain this lady with multiple risk factors for coronary artery disease.  She did have recently stress test done which showed no evidence of ischemia however symptomatology is worrisome on top of that she does have worsening of left ventricle ejection fraction, I did review CT of her chest from 2022 which showed calcification of the coronary artery, therefore, I am worried that she may have significant coronary artery disease that simply being missed on the stress test which could happen if he got triple-vessel disease the left main.  This is one of the  condition need to be rule out I will think we  have enough argumentation to pursue cardiac catheterization, but coronary CT angio will be reasonable.  In the meantime we will continue antiplatelets therapy. Cardiomyopathy ejection fraction 30% she is on Entresto 2426.  I will ask her to have Chem-7 if Chem-7 is fine we will double the dose of the medication.  She is already on SGLT 2 inhibitor which I continue.  She is not on beta-blocker because of asthma. Paroxysmal atrial fibrillation she to be anticoagulated I will initiate Eliquis will check CBC today we will check CBC in a few weeks. This is a very complex situation I would like to see her back in my office in about 3 weeks to continue adjusting her medications, hopefully in the meantime she will have coronary CT angiogram to rule out significant coronary artery disease   Medication Adjustments/Labs and Tests Ordered: Current medicines are reviewed at length with the patient today.  Concerns regarding medicines are outlined above.  Orders Placed This Encounter  Procedures   CT CORONARY MORPH W/CTA COR W/SCORE W/CA W/CM &/OR WO/CM   Basic metabolic panel   CBC with Differential/Platelet   EKG 12-Lead   Medication changes:  Meds ordered this encounter  Medications   apixaban (ELIQUIS) 5 MG TABS tablet    Sig: Take 1 tablet (5 mg total) by mouth 2 (two) times daily.    Dispense:  60 tablet    Refill:  5   metoprolol tartrate (LOPRESSOR) 100 MG tablet    Sig: Take 1 tablet (100 mg total) by mouth once for 1 dose. Take 2 hours prior to CT    Dispense:  1 tablet    Refill:  0    Signed, Park Liter, MD, [facility] 07/28/2022 4:00 PM    C

## 2022-07-28 NOTE — Patient Instructions (Signed)
Medication Instructions:  Your physician has recommended you make the following change in your medication:  Start Eliquis 5 mg two times daily  *If you need a refill on your cardiac medications before your next appointment, please call your pharmacy*   Lab Work: Your physician recommends that you return for lab work in: Today for BMP and CBC  If you have labs (blood work) drawn today and your tests are completely normal, you will receive your results only by: MyChart Message (if you have MyChart) OR A paper copy in the mail If you have any lab test that is abnormal or we need to change your treatment, we will call you to review the results.   Testing/Procedures:  Your cardiac CT will be scheduled at one of the below locations:   [facility] [address] [place], [place] [address] [phone]   If scheduled at [facility], please arrive at the [facility] (Entrance C2) of [facility] 30 minutes prior to test start time. You can use the FREE valet parking offered at entrance C (encouraged to control the heart rate for the test)  Proceed to the [facility] Radiology Department (first floor) to check-in and test prep.  All radiology patients and guests should use entrance C2 at [facility], accessed from [facility], even though the hospital's physical address listed is [address].      Please follow these instructions carefully (unless otherwise directed):    On the Night Before the Test: Be sure to Drink plenty of water. Do not consume any caffeinated/decaffeinated beverages or chocolate 12 hours prior to your test. Do not take any antihistamines 12 hours prior to your test. On the Day of the Test: Drink plenty of water until 1 hour prior to the test. Do not eat any food 4 hours prior to the test. You may take your regular medications prior to the test.  Take metoprolol (Lopressor) two  hours prior to test. FEMALES- please wear underwire-free bra if available, avoid dresses & tight clothing        After the Test: Drink plenty of water. After receiving IV contrast, you may experience a mild flushed feeling. This is normal. On occasion, you may experience a mild rash up to 24 hours after the test. This is not dangerous. If this occurs, you can take Benadryl 25 mg and increase your fluid intake. If you experience trouble breathing, this can be serious. If it is severe call 911 IMMEDIATELY. If it is mild, please call our office. If you take any of these medications: Glipizide/Metformin, Avandament, Glucavance, please do not take 48 hours after completing test unless otherwise instructed.  We will call to schedule your test 2-4 weeks out understanding that some insurance companies will need an authorization prior to the service being performed.   For non-scheduling related questions, please contact the cardiac imaging nurse navigator should you have any questions/concerns: Marchia Bond, Cardiac Imaging Nurse Navigator Gordy Clement, Cardiac Imaging Nurse Navigator [facility] Direct Office Dial: [phone]   For scheduling needs, including cancellations and rescheduling, please call [place], [phone].    Follow-Up: At [facility], you and your health needs are our priority.  As part of our continuing mission to provide you with exceptional heart care, we have created designated Provider Care Teams.  These Care Teams include your primary Cardiologist (physician) and Advanced Practice Providers (APPs -  Physician Assistants and  Nurse Practitioners) who all work together to provide you with the care you need, when you need it.  We recommend signing up for the patient portal called "MyChart".  Sign up information is provided on this After Visit Summary.  MyChart is used to connect with patients for Virtual Visits (Telemedicine).   Patients are able to view lab/test results, encounter notes, upcoming appointments, etc.  Non-urgent messages can be sent to your provider as well.   To learn more about what you can do with MyChart, go to [email or web address].    Your next appointment:   3 week(s)  The format for your next appointment:   In Person  Provider:   Jenne Campus, MD    Other Instructions   Important Information About Sugar

## 2022-07-28 NOTE — Telephone Encounter (Signed)
Prior Auth for Eliquis done through cover my meds with an approved status. Confirmation [id number]. Patient notified

## 2022-07-29 ENCOUNTER — Encounter: Payer: Self-pay | Admitting: Cardiology

## 2022-07-29 LAB — CBC WITH DIFFERENTIAL/PLATELET
Basophils Absolute: 0 10*3/uL (ref 0.0–0.2)
Basos: 1 %
EOS (ABSOLUTE): 0.4 10*3/uL (ref 0.0–0.4)
Eos: 5 %
Hematocrit: 46.6 % (ref 34.0–46.6)
Hemoglobin: 15.6 g/dL (ref 11.1–15.9)
Immature Grans (Abs): 0 10*3/uL (ref 0.0–0.1)
Immature Granulocytes: 0 %
Lymphocytes Absolute: 1.6 10*3/uL (ref 0.7–3.1)
Lymphs: 22 %
MCH: 29.4 pg (ref 26.6–33.0)
MCHC: 33.5 g/dL (ref 31.5–35.7)
MCV: 88 fL (ref 79–97)
Monocytes Absolute: 0.6 10*3/uL (ref 0.1–0.9)
Monocytes: 8 %
Neutrophils Absolute: 4.6 10*3/uL (ref 1.4–7.0)
Neutrophils: 64 %
Platelets: 178 10*3/uL (ref 150–450)
RBC: 5.3 x10E6/uL — ABNORMAL HIGH (ref 3.77–5.28)
RDW: 14.1 % (ref 11.7–15.4)
WBC: 7.1 10*3/uL (ref 3.4–10.8)

## 2022-07-29 LAB — BASIC METABOLIC PANEL
BUN/Creatinine Ratio: 20 (ref 9–23)
BUN: 19 mg/dL (ref 6–24)
CO2: 23 mmol/L (ref 20–29)
Calcium: 9.6 mg/dL (ref 8.7–10.2)
Chloride: 103 mmol/L (ref 96–106)
Creatinine, Ser: 0.93 mg/dL (ref 0.57–1.00)
Glucose: 92 mg/dL (ref 70–99)
Potassium: 5 mmol/L (ref 3.5–5.2)
Sodium: 141 mmol/L (ref 134–144)
eGFR: 71 mL/min/{1.73_m2} (ref >59)

## 2022-08-02 NOTE — Telephone Encounter (Signed)
Results reviewed with pt as per Dr. Krasowski's note.  Pt verbalized understanding and had no additional questions. Routed to PCP  

## 2022-08-05 ENCOUNTER — Encounter: Payer: Self-pay | Admitting: Cardiology

## 2022-08-12 ENCOUNTER — Other Ambulatory Visit: Payer: Self-pay | Admitting: *Deleted

## 2022-08-12 DIAGNOSIS — J4550 Severe persistent asthma, uncomplicated: Secondary | ICD-10-CM

## 2022-08-15 ENCOUNTER — Ambulatory Visit (INDEPENDENT_AMBULATORY_CARE_PROVIDER_SITE_OTHER): Payer: Commercial Managed Care - HMO | Admitting: Pulmonary Disease

## 2022-08-15 DIAGNOSIS — J4550 Severe persistent asthma, uncomplicated: Secondary | ICD-10-CM | POA: Diagnosis not present

## 2022-08-15 LAB — PULMONARY FUNCTION TEST
DL/VA % pred: 121 %
DL/VA: 5.35 ml/min/mmHg/L
DLCO cor % pred: 106 %
DLCO cor: 18.06 ml/min/mmHg
DLCO unc % pred: 112 %
DLCO unc: 19.17 ml/min/mmHg
FEF 25-75 Post: 2.83 L/sec
FEF 25-75 Pre: 2.36 L/sec
FEF2575-%Change-Post: 20 %
FEF2575-%Pred-Post: 133 %
FEF2575-%Pred-Pre: 111 %
FEV1-%Change-Post: 8 %
FEV1-%Pred-Post: 94 %
FEV1-%Pred-Pre: 87 %
FEV1-Post: 2.02 L
FEV1-Pre: 1.86 L
FEV1FVC-%Change-Post: 2 %
FEV1FVC-%Pred-Pre: 108 %
FEV6-%Change-Post: 5 %
FEV6-%Pred-Post: 86 %
FEV6-%Pred-Pre: 82 %
FEV6-Post: 2.3 L
FEV6-Pre: 2.18 L
FEV6FVC-%Pred-Post: 103 %
FEV6FVC-%Pred-Pre: 103 %
FVC-%Change-Post: 5 %
FVC-%Pred-Post: 83 %
FVC-%Pred-Pre: 79 %
FVC-Post: 2.3 L
FVC-Pre: 2.18 L
Post FEV1/FVC ratio: 88 %
Post FEV6/FVC ratio: 100 %
Pre FEV1/FVC ratio: 85 %
Pre FEV6/FVC Ratio: 100 %
RV % pred: 43 %
RV: 0.75 L
TLC % pred: 92 %
TLC: 3.98 L

## 2022-08-15 NOTE — Patient Instructions (Signed)
Full PFT Performed Today  

## 2022-08-15 NOTE — Progress Notes (Signed)
Full PFT Performed Today  

## 2022-08-18 ENCOUNTER — Ambulatory Visit: Payer: Commercial Managed Care - HMO | Attending: Cardiology | Admitting: Cardiology

## 2022-08-18 ENCOUNTER — Telehealth: Payer: Self-pay

## 2022-08-18 ENCOUNTER — Encounter: Payer: Self-pay | Admitting: Cardiology

## 2022-08-18 VITALS — BP 130/76 | HR 77 | Ht 59.0 in | Wt 170.4 lb

## 2022-08-18 DIAGNOSIS — I10 Essential (primary) hypertension: Secondary | ICD-10-CM

## 2022-08-18 DIAGNOSIS — I5022 Chronic systolic (congestive) heart failure: Secondary | ICD-10-CM | POA: Diagnosis not present

## 2022-08-18 DIAGNOSIS — I420 Dilated cardiomyopathy: Secondary | ICD-10-CM

## 2022-08-18 DIAGNOSIS — I480 Paroxysmal atrial fibrillation: Secondary | ICD-10-CM

## 2022-08-18 MED ORDER — SPIRONOLACTONE 25 MG PO TABS: 12.5000 mg | ORAL_TABLET | Freq: Every day | ORAL | 3 refills | Status: DC

## 2022-08-18 NOTE — Progress Notes (Signed)
Cardiology Office Note:    Date:  08/18/2022   ID:  Crystal Marquez, DOB 12-09-1960, MRN [id number]  PCP:  Rhea Bleacher, NP  Cardiologist:  Jenne Campus, MD    Referring MD: Rhea Bleacher, NP   Chief Complaint  Patient presents with   CAth not performed    History of Present Illness:    Crystal Marquez is a 61 y.o. female  with past medical history significant for essential hypertension, dyslipidemia, asthma, anxiety she did have echocardiogram done few months ago because of shortness of breath she was found to have ejection fraction 40%, after that stress test has been performed which showed no evidence of ischemia then recently she ended up coming to the hospital shortness of breath as well as chest pain. She ruled out for myocardial infarction, echocardiogram repeated showed decreasing left ventricle ejection fraction to 30%. She is being discharged home and she requested to be follow-up with [place].  I have seen her last time about a month ago she was doing quite well hemodynamically compensated plan was to pursue coronary CT angio to rule out triple-vessel disease which I warned about she may have.  However she comes today and look like coronary CT angio has not been done she says majority of time she is doing well but she describes 1 episodes that she went to the restroom in the middle of the night, back lay down and started having warmth like sensation in the face and chest.  She had to lay down for about half an hour but she was laying down flat with not much shortness of breath until the sensation subsided.  Since that time she has been doing well she complained of being weak tired exhausted does have some palpitations intermittently  Past Medical History:  Diagnosis Date   Age-related osteoporosis without current pathological fracture    Allergic rhinitis    Anxiety    Asthma    B12 deficiency 05/06/2020   Benign essential HTN 08/29/2019   Carpal tunnel syndrome of right wrist  08/09/2018   Chronic kidney disease    Chronic pain syndrome    Class 2 obesity due to excess calories without serious comorbidity with body mass index (BMI) of 36.0 to 36.9 in adult 08/29/2019   Colitis 05/06/2020   Colon polyp    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[place]) 05/06/2020   Coronary atherosclerosis 10/06/2021   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   Dyslipidemia 08/29/2019   Dyspnea on exertion 10/06/2021   Fibromyalgia 09/30/2021   GERD (gastroesophageal reflux disease)    Hypercalcemia    Hypertension    Iron deficiency anemia due to chronic blood loss 05/06/2020   Low back pain 11/11/2021   Major depressive disorder, single episode    Mild intermittent asthma without complication 84/16/6063   Myopathy    Neck pain 11/11/2021   Neuropathy    Pain in right hand 08/09/2018   Polyarthralgia 09/30/2021   Reflux esophagitis    Sjogren syndrome, unspecified ([place]) 09/30/2021   Sjogren's disease ([place])    Spinal stenosis of lumbar region 12/13/2017   Tenosynovitis of left hand 08/09/2018   Type 2 diabetes mellitus without complication, without long-term current use of insulin ([place]) 08/29/2019   Unilateral primary osteoarthritis, right knee 11/03/2021    Past Surgical History:  Procedure Laterality Date   ABDOMINAL HYSTERECTOMY     CARPAL TUNNEL RELEASE Left 2018   CERVICAL FUSION  10/07/2019   Anterior Cervical Discectomy due to spinal stenosis  CESAREAN SECTION     COLONOSCOPY     UPPER GASTROINTESTINAL ENDOSCOPY      Current Medications: Current Meds  Medication Sig   albuterol (PROVENTIL) (2.5 MG/3ML) 0.083% nebulizer solution Take 2.5 mg by nebulization every 6 (six) hours as needed for wheezing or shortness of breath.   albuterol (VENTOLIN HFA) 108 (90 Base) MCG/ACT inhaler Inhale 1 puff into the lungs every 6 (six) hours as needed for wheezing or shortness of breath.   ALPRAZolam (XANAX) 0.5 MG tablet Take 0.5 mg by mouth 3 (three) times daily as needed for anxiety.    apixaban (ELIQUIS) 5 MG TABS tablet Take 1 tablet (5 mg total) by mouth 2 (two) times daily.   Cholecalciferol (VITAMIN D3) 125 MCG (5000 UT) CAPS Take 5,000 Units by mouth daily.   [employer] SENNA 8.6 MG tablet TAKE 1 TABLET (8.6 MG TOTAL) BY MOUTH EVERY OTHER DAY. (Patient taking differently: Take 1 tablet by mouth daily as needed for constipation.)   DULoxetine (CYMBALTA) 60 MG capsule Take 120 mg by mouth daily.   FARXIGA 10 MG TABS tablet Take 10 mg by mouth daily.   ferrous sulfate 325 (65 FE) MG tablet Take 325 mg by mouth daily with breakfast.   fexofenadine (ALLEGRA) 180 MG tablet Take 180 mg by mouth daily.   fluticasone-salmeterol (WIXELA INHUB) 500-50 MCG/ACT AEPB Inhale 1 puff into the lungs in the morning and at bedtime.   Folic Acid 0.8 MG CAPS Take 2 capsules by mouth daily.   hydroxychloroquine (PLAQUENIL) 200 MG tablet Take 400 mg by mouth daily.   lidocaine (LIDODERM) 5 % Place 1 patch onto the skin daily. Remove & Discard patch within 12 hours or as directed by MD   metFORMIN (GLUCOPHAGE) 500 MG tablet Take 500 mg by mouth 2 (two) times daily with a meal.   methocarbamol (ROBAXIN) 500 MG tablet TAKE 1 TABLET BY MOUTH 4 TIMES DAILY.   metoprolol tartrate (LOPRESSOR) 100 MG tablet Take 1 tablet (100 mg total) by mouth once for 1 dose. Take 2 hours prior to CT   montelukast (SINGULAIR) 10 MG tablet Take 10 mg by mouth at bedtime.   NYSTATIN powder Apply 1 application  topically daily as needed (under breast/folds of skin).   pantoprazole (PROTONIX) 40 MG tablet Take 40 mg by mouth daily.   polyethylene glycol (MIRALAX / GLYCOLAX) 17 g packet Take 17 g by mouth daily as needed for mild constipation.   potassium chloride SA (K-DUR,KLOR-CON) 20 MEQ tablet Take 20 mEq by mouth daily.   pregabalin (LYRICA) 150 MG capsule Take 150 mg by mouth 3 (three) times daily.   rosuvastatin (CRESTOR) 5 MG tablet Take 5 mg by mouth daily.   sacubitril-valsartan (ENTRESTO) 24-26 MG Take 1 tablet by  mouth 2 (two) times daily.   triamcinolone (KENALOG) 0.1 % paste Use as directed 1 Application in the mouth or throat 2 (two) times daily.   umeclidinium bromide (INCRUSE ELLIPTA) 62.5 MCG/ACT AEPB Inhale 1 puff into the lungs daily.     Allergies:   Lisinopril   Social History   Socioeconomic History   Marital status: Married    Spouse name: Not on file   Number of children: Not on file   Years of education: Not on file   Highest education level: Not on file  Occupational History   Not on file  Tobacco Use   Smoking status: Never   Smokeless tobacco: Never  Vaping Use   Vaping Use: Never used  Substance and Sexual Activity   Alcohol use: No    Alcohol/week: 0.0 standard drinks of alcohol   Drug use: No   Sexual activity: Not on file  Other Topics Concern   Not on file  Social History Narrative   Not on file   Social Determinants of Health   Financial Resource Strain: Not on file  Food Insecurity: No Food Insecurity (07/20/2022)   Hunger Vital Sign    Worried About Running Out of Food in the Last Year: Never true    Ran Out of Food in the Last Year: Never true  Transportation Needs: No Transportation Needs (07/20/2022)   PRAPARE - Hydrologist (Medical): No    Lack of Transportation (Non-Medical): No  Physical Activity: Not on file  Stress: Not on file  Social Connections: Not on file     Family History: The patient's family history includes Asthma in her mother; COPD in her mother; Colon polyps in her father and sister; Crohn's disease in her sister; Depression in her father; Diabetes in her father and mother; Diverticulitis in her sister; Heart disease in her father, maternal grandmother, and paternal grandmother; Hypercholesterolemia in her sister; Hypertension in her mother; Kidney disease in her brother and mother; Skin cancer in her father. There is no history of Colon cancer, Rectal cancer, or Stomach cancer. ROS:   Please see the  history of present illness.    All 14 point review of systems negative except as described per history of present illness  EKGs/Labs/Other Studies Reviewed:      Recent Labs: 02/01/2022: ALT 22 07/28/2022: BUN 19; Creatinine, Ser 0.93; Hemoglobin 15.6; Platelets 178; Potassium 5.0; Sodium 141  Recent Lipid Panel    Component Value Date/Time   CHOL 122 07/20/2022 0459   TRIG 70 07/20/2022 0459   HDL 61 07/20/2022 0459   CHOLHDL 2.0 07/20/2022 0459   VLDL 14 07/20/2022 0459   LDLCALC 47 07/20/2022 0459    Physical Exam:    VS:  BP 130/76 (BP Location: Left Arm, Patient Position: Sitting)   Pulse 77   Ht '4\' 11"'$  (1.499 m)   Wt 170 lb 6.4 oz (77.3 kg)   LMP  (LMP Unknown)   SpO2 96%   BMI 34.42 kg/m     Wt Readings from Last 3 Encounters:  08/18/22 170 lb 6.4 oz (77.3 kg)  07/28/22 171 lb 3.2 oz (77.7 kg)  07/20/22 171 lb 8.3 oz (77.8 kg)     GEN:  Well nourished, well developed in no acute distress HEENT: Normal NECK: No JVD; No carotid bruits LYMPHATICS: No lymphadenopathy CARDIAC: RRR, no murmurs, no rubs, no gallops RESPIRATORY:  Clear to auscultation without rales, wheezing or rhonchi  ABDOMEN: Soft, non-tender, non-distended MUSCULOSKELETAL:  No edema; No deformity  SKIN: Warm and dry LOWER EXTREMITIES: no swelling NEUROLOGIC:  Alert and oriented x 3 PSYCHIATRIC:  Normal affect   ASSESSMENT:    1. Dilated cardiomyopathy (HCC) ejection fraction percent based on done in November 2 023   2. Paroxysmal atrial fibrillation (HCC)   3. Benign essential HTN   4. Chronic systolic congestive heart failure (HCC)    PLAN:    In order of problems listed above:  Dilated cardiomyopathy.  Will ask her to stop potassium we will give him Aldactone 12.5 daily, next week will check Chem-7.  We still trying to figure out why she did not have coronary CT angio done.  That will be done to rule out  potential triple-vessel disease of left main which can be easily missed on the  stress test.  With her worsening of left ventricle ejection fraction but some must rule out. Paroxysmal fibrillation, she is anticoagulated doing well short episode of atrial fibrillation.  After we clarified etiology of her cardiomyopathy will consider EP referral for ablation Benign essential hypertension blood presumed to control continue present management slightly on the higher side hopefully addition of Aldactone will help with that Congestive heart failure looks compensated on the physical exam   Medication Adjustments/Labs and Tests Ordered: Current medicines are reviewed at length with the patient today.  Concerns regarding medicines are outlined above.  No orders of the defined types were placed in this encounter.  Medication changes: No orders of the defined types were placed in this encounter.   Signed, Park Liter, MD, [facility] 08/18/2022 3:23 PM    [place]

## 2022-08-18 NOTE — Patient Instructions (Signed)
Medication Instructions:  Your physician has recommended you make the following change in your medication:   STOP: Potassium START: Sprironolactone 12.5 mg daily  *If you need a refill on your cardiac medications before your next appointment, please call your pharmacy*   Lab Work: Your physician recommends that you return for lab work in:   Labs in 1 week: BMP  If you have labs (blood work) drawn today and your tests are completely normal, you will receive your results only by: [place] (if you have [place]) OR A paper copy in the mail If you have any lab test that is abnormal or we need to change your treatment, we will call you to review the results.   Testing/Procedures: None   Follow-Up: At [facility], you and your health needs are our priority.  As part of our continuing mission to provide you with exceptional heart care, we have created designated Provider Care Teams.  These Care Teams include your primary Cardiologist (physician) and Advanced Practice Providers (APPs -  Physician Assistants and Nurse Practitioners) who all work together to provide you with the care you need, when you need it.  We recommend signing up for the patient portal called "MyChart".  Sign up information is provided on this After Visit Summary.  MyChart is used to connect with patients for Virtual Visits (Telemedicine).  Patients are able to view lab/test results, encounter notes, upcoming appointments, etc.  Non-urgent messages can be sent to your provider as well.   To learn more about what you can do with MyChart, go to [email or web address].    Your next appointment:   1 month(s)  The format for your next appointment:   In Person  Provider:   Jenne Campus, MD    Other Instructions None  Important Information About Sugar

## 2022-08-18 NOTE — Telephone Encounter (Signed)
Patient was at her office visit and when she was checking out she asked if Dr. Agustin Cree could increase her entresto. I told her that I would ask Dr. Agustin Cree and call her with his answer. After asking Dr. Agustin Cree about increasing her entresto, his response is below:  "Now, we switch potassium to Aldactone, I cannot increase Entresto right now I want a make sure potassium is okay before increasing it"  Patient was agreeable with this plan and had no further questions at this time.

## 2022-08-19 NOTE — Telephone Encounter (Signed)
PFT results from 08/15/22. Patient's result appear to still disqualify her from [place] based on insurance. Because of this, will need to submit additional clinicals with reconsideration  Component Ref Range & Units 4 d ago  FVC-Pre L 2.18 P  FVC-%Pred-Pre % 79 P  FVC-Post L 2.30 P  FVC-%Pred-Post % 83 P  FVC-%Change-Post % 5 P  FEV1-Pre L 1.86 P  FEV1-%Pred-Pre % 87 P  FEV1-Post L 2.02 P  FEV1-%Pred-Post % 94 P  FEV1-%Change-Post % 8 P  FEV6-Pre L 2.18 P  FEV6-%Pred-Pre % 82 P  FEV6-Post L 2.30 P  FEV6-%Pred-Post % 86 P  FEV6-%Change-Post % 5 P  Pre FEV1/FVC ratio % 85 P  FEV1FVC-%Pred-Pre % 108 P  Post FEV1/FVC ratio % 88 P  FEV1FVC-%Change-Post % 2 P  Pre FEV6/FVC Ratio % 100 P  FEV6FVC-%Pred-Pre % 103 P  Post FEV6/FVC ratio % 100 P  FEV6FVC-%Pred-Post % 103 P  DLCO unc ml/min/mmHg 19.17 P  DLCO unc % pred % 112 P  DLCO cor ml/min/mmHg 18.06 P  DLCO cor % pred % 106 P  DL/VA ml/min/mmHg/L 5.35 P  DL/VA % pred % 121 P   Genna Casimir, PharmD, MPH, BCPS, CPP Clinical Pharmacist (Rheumatology and Pulmonology)

## 2022-08-22 ENCOUNTER — Other Ambulatory Visit: Payer: Self-pay | Admitting: Physician Assistant

## 2022-08-26 ENCOUNTER — Telehealth (HOSPITAL_COMMUNITY): Payer: Self-pay | Admitting: *Deleted

## 2022-08-26 LAB — BASIC METABOLIC PANEL
BUN/Creatinine Ratio: 20 (ref 9–23)
BUN: 21 mg/dL (ref 6–24)
CO2: 19 mmol/L — ABNORMAL LOW (ref 20–29)
Calcium: 10 mg/dL (ref 8.7–10.2)
Chloride: 104 mmol/L (ref 96–106)
Creatinine, Ser: 1.04 mg/dL — ABNORMAL HIGH (ref 0.57–1.00)
Glucose: 101 mg/dL — ABNORMAL HIGH (ref 70–99)
Potassium: 4.8 mmol/L (ref 3.5–5.2)
Sodium: 144 mmol/L (ref 134–144)
eGFR: 62 mL/min/{1.73_m2} (ref >59)

## 2022-08-26 NOTE — Telephone Encounter (Signed)
Reaching out to patient to offer assistance regarding upcoming cardiac imaging study; pt verbalizes understanding of appt date/time, parking situation and where to check in, pre-test NPO status and medications ordered, and verified current allergies; name and call back number provided for further questions should they arise  Tayvon Culley RN Navigator Cardiac Imaging [facility] [phone] office [phone] cell  Patient to take 100mg metoprolol tartrate two hours prior to her cardiac CT scan. She is aware to arrive at 8:30am. 

## 2022-08-26 NOTE — Telephone Encounter (Signed)
Attempted to call patient regarding upcoming cardiac CT appointment. °Left message on voicemail with name and callback number ° °Paquita Printy RN Navigator Cardiac Imaging °[facility] °[phone] Office °[phone] Cell ° °

## 2022-08-29 ENCOUNTER — Ambulatory Visit (HOSPITAL_COMMUNITY)
Admission: RE | Admit: 2022-08-29 | Discharge: 2022-08-29 | Disposition: A | Discharge status: Home / Self Care | Payer: Commercial Managed Care - HMO | Source: Ambulatory Visit | Attending: Cardiology | Admitting: Cardiology

## 2022-08-29 DIAGNOSIS — R072 Precordial pain: Secondary | ICD-10-CM | POA: Insufficient documentation

## 2022-08-29 MED ORDER — NITROGLYCERIN 0.4 MG SL SUBL
0.8000 mg | SUBLINGUAL_TABLET | Freq: Once | SUBLINGUAL | Status: AC
  Administered 2022-08-29: 0.8 mg via SUBLINGUAL

## 2022-08-29 MED ORDER — NITROGLYCERIN 0.4 MG SL SUBL
SUBLINGUAL_TABLET | SUBLINGUAL | Status: AC
  Filled 2022-08-29: qty 2

## 2022-08-29 MED ORDER — IOHEXOL 350 MG/ML SOLN
95.0000 mL | Freq: Once | INTRAVENOUS | Status: AC | PRN
  Administered 2022-08-29: 95 mL via INTRAVENOUS

## 2022-08-31 ENCOUNTER — Telehealth: Payer: Self-pay

## 2022-08-31 NOTE — Telephone Encounter (Signed)
LVM and My chart message per Dr. Wendy Poet note. Encouraged pt to call with any questions or concerns. Routed to PCP.

## 2022-08-31 NOTE — Telephone Encounter (Signed)
Message sent per My Chart. Pt viewed message and results. Routed to PCP

## 2022-09-01 ENCOUNTER — Encounter: Payer: Self-pay | Admitting: Pulmonary Disease

## 2022-09-01 NOTE — Telephone Encounter (Signed)
Called and spoke with patient. I was able to get her scheduled for her 3 month f/u in January before his schedule filled up. I advised her that I would send a message to [place] in regards to her PFT results.   Dr. Silas Flood, can you please advise on her results? Thanks!

## 2022-09-14 ENCOUNTER — Other Ambulatory Visit: Payer: Self-pay | Admitting: Physician Assistant

## 2022-09-16 ENCOUNTER — Encounter: Payer: Self-pay | Admitting: Cardiology

## 2022-09-16 ENCOUNTER — Ambulatory Visit: Payer: Commercial Managed Care - HMO | Attending: Cardiology | Admitting: Cardiology

## 2022-09-16 VITALS — BP 130/70 | HR 66 | Ht 59.0 in | Wt 171.0 lb

## 2022-09-16 DIAGNOSIS — E119 Type 2 diabetes mellitus without complications: Secondary | ICD-10-CM

## 2022-09-16 DIAGNOSIS — I10 Essential (primary) hypertension: Secondary | ICD-10-CM | POA: Diagnosis not present

## 2022-09-16 DIAGNOSIS — I480 Paroxysmal atrial fibrillation: Secondary | ICD-10-CM

## 2022-09-16 DIAGNOSIS — I420 Dilated cardiomyopathy: Secondary | ICD-10-CM

## 2022-09-16 MED ORDER — METOPROLOL SUCCINATE ER 25 MG PO TB24: 50.0000 mg | ORAL_TABLET | Freq: Every day | ORAL | 3 refills | Status: DC

## 2022-09-16 NOTE — Patient Instructions (Signed)
Medication Instructions:   START: Metoprolol Succinate '25mg'$  1 tablet daily   Lab Work: None Ordered If you have labs (blood work) drawn today and your tests are completely normal, you will receive your results only by: MyChart Message (if you have MyChart) OR A paper copy in the mail If you have any lab test that is abnormal or we need to change your treatment, we will call you to review the results.   Testing/Procedures: None Ordered   Follow-Up: At [facility], you and your health needs are our priority.  As part of our continuing mission to provide you with exceptional heart care, we have created designated Provider Care Teams.  These Care Teams include your primary Cardiologist (physician) and Advanced Practice Providers (APPs -  Physician Assistants and Nurse Practitioners) who all work together to provide you with the care you need, when you need it.  We recommend signing up for the patient portal called "MyChart".  Sign up information is provided on this After Visit Summary.  MyChart is used to connect with patients for Virtual Visits (Telemedicine).  Patients are able to view lab/test results, encounter notes, upcoming appointments, etc.  Non-urgent messages can be sent to your provider as well.   To learn more about what you can do with MyChart, go to [email or web address].    Your next appointment:   6 week(s)  The format for your next appointment:   In Person  Provider:   Jenne Campus, MD    Other Instructions NA

## 2022-09-16 NOTE — Progress Notes (Unsigned)
Cardiology Office Note:    Date:  09/16/2022   ID:  Crystal Marquez, DOB [birth year], MRN [id number]  PCP:  Rhea Bleacher, NP  Cardiologist:  Jenne Campus, MD    Referring MD: Rhea Bleacher, NP   Chief Complaint  Patient presents with   Follow-up  Doing fine but weak and tired  History of Present Illness:    Crystal Marquez is a 61 y.o. female past medical history significant for essential hypertension, paroxysmal atrial fibrillation, cardiomyopathy with ejection fraction moderately diminished, nonischemic, coronary CT angio showed 0 to 25% stenosis of LAD which obviously does not Her cardiomyopathy. She is in my office today doing well denies have any chest pain tightness squeezing pressure burning chest.  Does complain of weakness fatigue tiredness no dizziness no passing out  Past Medical History:  Diagnosis Date   Age-related osteoporosis without current pathological fracture    Allergic rhinitis    Anxiety    Asthma    B12 deficiency 05/06/2020   Benign essential HTN 08/29/2019   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   Chronic kidney disease    Chronic pain syndrome    Class 2 obesity due to excess calories without serious comorbidity with body mass index (BMI) of 36.0 to 36.9 in adult 08/29/2019   Colitis 05/06/2020   Colon polyp    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[place]) 05/06/2020   Coronary atherosclerosis 10/06/2021   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   Dyslipidemia 08/29/2019   Dyspnea on exertion 10/06/2021   Fibromyalgia 09/30/2021   GERD (gastroesophageal reflux disease)    Hypercalcemia    Hypertension    Iron deficiency anemia due to chronic blood loss 05/06/2020   Low back pain 11/11/2021   Major depressive disorder, single episode    Mild intermittent asthma without complication 63/33/5456   Myopathy    Neck pain 11/11/2021   Neuropathy    Pain in right hand 08/09/2018   Polyarthralgia 09/30/2021   Reflux esophagitis    Sjogren syndrome,  unspecified ([place]) 09/30/2021   Sjogren's disease ([place])    Spinal stenosis of lumbar region 12/13/2017   Tenosynovitis of left hand 08/09/2018   Type 2 diabetes mellitus without complication, without long-term current use of insulin ([place]) 08/29/2019   Unilateral primary osteoarthritis, right knee 11/03/2021    Past Surgical History:  Procedure Laterality Date   ABDOMINAL HYSTERECTOMY     CARPAL TUNNEL RELEASE Left 2018   CERVICAL FUSION  10/07/2019   Anterior Cervical Discectomy due to spinal stenosis   CESAREAN SECTION     COLONOSCOPY     UPPER GASTROINTESTINAL ENDOSCOPY      Current Medications: Current Meds  Medication Sig   albuterol (PROVENTIL) (2.5 MG/3ML) 0.083% nebulizer solution Take 2.5 mg by nebulization every 6 (six) hours as needed for wheezing or shortness of breath.   albuterol (VENTOLIN HFA) 108 (90 Base) MCG/ACT inhaler Inhale 1 puff into the lungs every 6 (six) hours as needed for wheezing or shortness of breath.   ALPRAZolam (XANAX) 0.5 MG tablet Take 0.5 mg by mouth 3 (three) times daily as needed for anxiety.   apixaban (ELIQUIS) 5 MG TABS tablet Take 1 tablet (5 mg total) by mouth 2 (two) times daily.   Cholecalciferol (VITAMIN D3) 125 MCG (5000 UT) CAPS Take 5,000 Units by mouth daily.   [employer] SENNA 8.6 MG tablet TAKE 1 TABLET (8.6 MG TOTAL) BY MOUTH EVERY OTHER DAY.   DULoxetine (CYMBALTA) 60 MG capsule Take 120 mg by mouth  daily.   FARXIGA 10 MG TABS tablet Take 10 mg by mouth daily.   ferrous sulfate 325 (65 FE) MG tablet Take 325 mg by mouth daily with breakfast.   fexofenadine (ALLEGRA) 180 MG tablet Take 180 mg by mouth daily.   fluticasone-salmeterol (WIXELA INHUB) 500-50 MCG/ACT AEPB Inhale 1 puff into the lungs in the morning and at bedtime.   Folic Acid 0.8 MG CAPS Take 2 capsules by mouth daily.   hydroxychloroquine (PLAQUENIL) 200 MG tablet Take 400 mg by mouth daily.   lidocaine (LIDODERM) 5 % Place 1 patch onto the skin daily. Remove & Discard patch  within 12 hours or as directed by MD   metFORMIN (GLUCOPHAGE) 500 MG tablet Take 500 mg by mouth 2 (two) times daily with a meal.   methocarbamol (ROBAXIN) 500 MG tablet TAKE 1 TABLET BY MOUTH FOUR TIMES A DAY   montelukast (SINGULAIR) 10 MG tablet Take 10 mg by mouth at bedtime.   NYSTATIN powder Apply 1 application  topically daily as needed (under breast/folds of skin).   pantoprazole (PROTONIX) 40 MG tablet Take 40 mg by mouth daily.   polyethylene glycol (MIRALAX / GLYCOLAX) 17 g packet Take 17 g by mouth daily as needed for mild constipation.   pregabalin (LYRICA) 150 MG capsule Take 150 mg by mouth 3 (three) times daily.   rosuvastatin (CRESTOR) 5 MG tablet Take 5 mg by mouth daily.   sacubitril-valsartan (ENTRESTO) 24-26 MG Take 1 tablet by mouth 2 (two) times daily.   spironolactone (ALDACTONE) 25 MG tablet Take 0.5 tablets (12.5 mg total) by mouth daily.   triamcinolone (KENALOG) 0.1 % paste Use as directed 1 Application in the mouth or throat 2 (two) times daily.   umeclidinium bromide (INCRUSE ELLIPTA) 62.5 MCG/ACT AEPB Inhale 1 puff into the lungs daily.     Allergies:   Lisinopril   Social History   Socioeconomic History   Marital status: Married    Spouse name: Not on file   Number of children: Not on file   Years of education: Not on file   Highest education level: Not on file  Occupational History   Not on file  Tobacco Use   Smoking status: Never   Smokeless tobacco: Never  Vaping Use   Vaping Use: Never used  Substance and Sexual Activity   Alcohol use: No    Alcohol/week: 0.0 standard drinks of alcohol   Drug use: No   Sexual activity: Not on file  Other Topics Concern   Not on file  Social History Narrative   Not on file   Social Determinants of Health   Financial Resource Strain: Not on file  Food Insecurity: No Food Insecurity (07/20/2022)   Hunger Vital Sign    Worried About Running Out of Food in the Last Year: Never true    Ran Out of Food in  the Last Year: Never true  Transportation Needs: No Transportation Needs (07/20/2022)   PRAPARE - Hydrologist (Medical): No    Lack of Transportation (Non-Medical): No  Physical Activity: Not on file  Stress: Not on file  Social Connections: Not on file     Family History: The patient's family history includes Asthma in her mother; COPD in her mother; Colon polyps in her father and sister; Crohn's disease in her sister; Depression in her father; Diabetes in her father and mother; Diverticulitis in her sister; Heart disease in her father, maternal grandmother, and paternal grandmother; Hypercholesterolemia  in her sister; Hypertension in her mother; Kidney disease in her brother and mother; Skin cancer in her father. There is no history of Colon cancer, Rectal cancer, or Stomach cancer. ROS:   Please see the history of present illness.    All 14 point review of systems negative except as described per history of present illness  EKGs/Labs/Other Studies Reviewed:      Recent Labs: 02/01/2022: ALT 22 07/28/2022: Hemoglobin 15.6; Platelets 178 08/25/2022: BUN 21; Creatinine, Ser 1.04; Potassium 4.8; Sodium 144  Recent Lipid Panel    Component Value Date/Time   CHOL 122 07/20/2022 0459   TRIG 70 07/20/2022 0459   HDL 61 07/20/2022 0459   CHOLHDL 2.0 07/20/2022 0459   VLDL 14 07/20/2022 0459   LDLCALC 47 07/20/2022 0459    Physical Exam:    VS:  BP 130/70 (BP Location: Left Arm, Patient Position: Sitting, Cuff Size: Normal)   Pulse 66   Ht '4\' 11"'$  (1.499 m)   Wt 171 lb (77.6 kg)   LMP  (LMP Unknown)   SpO2 97%   BMI 34.54 kg/m     Wt Readings from Last 3 Encounters:  09/16/22 171 lb (77.6 kg)  08/18/22 170 lb 6.4 oz (77.3 kg)  07/28/22 171 lb 3.2 oz (77.7 kg)     GEN:  Well nourished, well developed in no acute distress HEENT: Normal NECK: No JVD; No carotid bruits LYMPHATICS: No lymphadenopathy CARDIAC: RRR, no murmurs, no rubs, no  gallops RESPIRATORY:  Clear to auscultation without rales, wheezing or rhonchi  ABDOMEN: Soft, non-tender, non-distended MUSCULOSKELETAL:  No edema; No deformity  SKIN: Warm and dry LOWER EXTREMITIES: no swelling NEUROLOGIC:  Alert and oriented x 3 PSYCHIATRIC:  Normal affect   ASSESSMENT:    1. Dilated cardiomyopathy (HCC) ejection fraction percent based on done in November 2 023   2. Benign essential HTN   3. Paroxysmal atrial fibrillation (HCC)   4. Type 2 diabetes mellitus without complication, without long-term current use of insulin (HCC)    PLAN:    In order of problems listed above:  Cardiomyopathy nonischemic, will put her on beta-blocker will use metoprolol succinate 25 daily, I did to see her back in about 6 weeks to augment her therapy in the meantime continue guideline directed medical therapy with ultimate goal to increase dosages of those medications. Essential hypertension blood pressure well-controlled continue present management. Paroxysmal atrial fibrillation maintained sinus rhythm she is anticoagulated which I will continue. Dyslipidemia I did review her K PN which show me LDL 47 HDL 61.  Will continue present management which include Crestor 5   Medication Adjustments/Labs and Tests Ordered: Current medicines are reviewed at length with the patient today.  Concerns regarding medicines are outlined above.  No orders of the defined types were placed in this encounter.  Medication changes: No orders of the defined types were placed in this encounter.   Signed, Park Liter, MD, [facility] 09/16/2022 3:10 PM    [place]

## 2022-09-28 ENCOUNTER — Ambulatory Visit: Payer: Commercial Managed Care - HMO | Admitting: Pulmonary Disease

## 2022-09-28 ENCOUNTER — Encounter: Payer: Self-pay | Admitting: Pulmonary Disease

## 2022-09-28 VITALS — BP 112/62 | HR 54 | Temp 98.0°F | Ht 59.0 in | Wt 172.4 lb

## 2022-09-28 DIAGNOSIS — J4551 Severe persistent asthma with (acute) exacerbation: Secondary | ICD-10-CM | POA: Diagnosis not present

## 2022-09-28 MED ORDER — PREDNISONE 20 MG PO TABS: 40.0000 mg | ORAL_TABLET | Freq: Every day | ORAL | 0 refills | Status: AC

## 2022-09-28 NOTE — Patient Instructions (Addendum)
Nice to see you again  I provided samples of Trelegy, but see if this helps more than the Wixela and Incruse you are on.  Use Trelegy 1 puff once a day, rinse mouth after use  You can stop Wixela and Incruse once you start the Trelegy  I will try to represcribe this as well as looking into getting that injection medicine called Tezspire, last year they did not approve either of these.  Take prednisone 40 mg for 5 days  Return to clinic in 3 months or sooner as needed with Dr. Silas Flood

## 2022-09-28 NOTE — Progress Notes (Signed)
$'@Patient'A$  ID: Crystal Marquez, female    DOB: Apr 14, 1961, 62 y.o.   MRN: [id number]  Chief Complaint  Patient presents with   Follow-up    Wheezing, heavy chest, congestion ( yellow) ACT 12     Referring provider: Rhea Bleacher, NP  HPI:   62 y.o. woman whom we are seeing in follow up for dyspnea, wheezing felt to be related to asthma.  Most recent cardiology note x2 reviewed.  Discharge summary 07/2022 reviewed.  Returns for scheduled follow-up.  Poorly controlled asthma despite triple inhaled therapy via Wixela and Incruse.  Trelegy has helped in the past but worsening symptoms over time.  Her insurance company will not approve Trelegy in 2023.  Thus the 2 inhalers as above.  Also try to get her test prior given recurrent exacerbations of poorly controlled disease but her insurance company denied this as well.  She continues have poorly controlled disease despite good adherence to high-dose Wixela and Incruse.  A lot of cough, congestion shortness of breath.  Very wheezy on exam today.  Discussed trying Trelegy once again, samples provided, will represcribe.  As well as reengaging efforts for Tezspire.   HPI at initial visit: Patient in normal state of health.  Got COVID October 2022.  Since then she is had intermittent issues with shortness of breath, wheezing.  Occasional cough.  Sometimes worse in the evenings.  Worse with exertion although can occur at rest as well.  The feeling of inability get deep breath, mild chest tightness.  Seems to come and go.  Some days are better than others.  No other seasonal environmental factors he can identify to make things better or worse.  Mild relief with prednisone, albuterol.  However after prednisone symptoms returned.  She reports history of recurrent bronchitis usually at least once a year sometimes twice a year with season changes.  Reviewed CT chest 08/23/2021 reveals clear lungs bilaterally on my review interpretation.  Reviewed echocardiogram  09/02/2021 that shows reduction in EF to 40% as well as grade 2 diastolic dysfunction and dilated left atrium, moderate MVR, normal RA size and normal RV function and size.   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   Questionaires / Pulmonary Flowsheets:   ACT:  Asthma Control Test ACT Total Score  09/28/2022  9:44 AM 12  06/28/2022 10:31 AM 15  03/25/2022 10:41 AM 18    MMRC:     No data to display           [place]:      No data to display           Tests:   FENO:  No results found for: "NITRICOXIDE"  PFT:    Latest Ref Rng & Units 08/15/2022    3:48 PM  PFT Results  FVC-Pre L 2.18   FVC-Predicted Pre % 79   FVC-Post L 2.30   FVC-Predicted Post % 83   Pre FEV1/FVC % % 85   Post FEV1/FCV % % 88   FEV1-Pre L 1.86   FEV1-Predicted Pre % 87   FEV1-Post L 2.02   DLCO uncorrected ml/min/mmHg 19.17   DLCO UNC% % 112   DLCO corrected ml/min/mmHg 18.06   DLCO COR %Predicted % 106  DLVA Predicted % 121   TLC L 3.98   TLC % Predicted % 92   RV % Predicted % 43   Personally reviewed interpreted as normal spirometry, no bronchodilator spots, lung wise within normal notes, DLCO within normal limits.  WALK:      No data to display           Imaging: Personally reviewed and as per EMR discussion this note No results found.  Lab Results: Personally reviewed via [place] with mild elevation in eosinophils 200 and 02/2020 CBC    Component Value Date/Time   WBC 7.1 07/28/2022 1603   WBC 8.5 07/19/2022 1509   RBC 5.30 (H) 07/28/2022 1603   RBC 5.28 (H) 07/19/2022 1509   HGB 15.6 07/28/2022 1603   HCT 46.6  07/28/2022 1603   PLT 178 07/28/2022 1603   MCV 88 07/28/2022 1603   MCH 29.4 07/28/2022 1603   MCH 29.4 07/19/2022 1509   MCHC 33.5 07/28/2022 1603   MCHC 32.4 07/19/2022 1509   RDW 14.1 07/28/2022 1603   LYMPHSABS 1.6 07/28/2022 1603   MONOABS 0.6 03/25/2022 1102   EOSABS 0.4 07/28/2022 1603   BASOSABS 0.0 07/28/2022 1603    BMET    Component Value Date/Time   NA 144 08/25/2022 1339   K 4.8 08/25/2022 1339   CL 104 08/25/2022 1339   CO2 19 (L) 08/25/2022 1339   GLUCOSE 101 (H) 08/25/2022 1339   GLUCOSE 103 (H) 07/20/2022 0459   BUN 21 08/25/2022 1339   CREATININE 1.04 (H) 08/25/2022 1339   CALCIUM 10.0 08/25/2022 1339   GFRNONAA 60 (L) 07/20/2022 0459    BNP No results found for: "BNP"  ProBNP No results found for: "PROBNP"  Specialty Problems       Pulmonary Problems   Mild intermittent asthma without complication   Allergic rhinitis   Asthma   Dyspnea on exertion    Allergies  Allergen Reactions   Lisinopril Shortness Of Breath    Bloating     Immunization History  Administered Date(s) Administered   Influenza,inj,quad, With Preservative 06/19/2017   Influenza-Unspecified 06/28/2021   PFIZER(Purple Top)SARS-COV-2 Vaccination 12/19/2019, 01/13/2020    Past Medical History:  Diagnosis Date   Age-related osteoporosis without current pathological fracture    Allergic rhinitis    Anxiety    Asthma    B12 deficiency 05/06/2020   Benign essential HTN 08/29/2019   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   Chronic kidney disease    Chronic pain syndrome    Class 2 obesity due to excess calories without serious comorbidity with body mass index (BMI) of 36.0 to 36.9 in adult 08/29/2019   Colitis 05/06/2020   Colon polyp    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[place]) 05/06/2020   Coronary atherosclerosis 10/06/2021   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   Dyslipidemia 08/29/2019   Dyspnea on exertion 10/06/2021   Fibromyalgia 09/30/2021   GERD  (gastroesophageal reflux disease)    Hypercalcemia    Hypertension    Iron deficiency anemia due to chronic blood loss 05/06/2020   Low back pain 11/11/2021   Major depressive disorder, single episode    Mild intermittent asthma without complication 42/70/6237   Myopathy    Neck pain 11/11/2021   Neuropathy    Pain in right hand 08/09/2018   Polyarthralgia 09/30/2021   Reflux esophagitis    Sjogren syndrome, unspecified ([place]) 09/30/2021   Sjogren's disease ([place])    Spinal stenosis of lumbar region 12/13/2017  Tenosynovitis of left hand 08/09/2018   Type 2 diabetes mellitus without complication, without long-term current use of insulin ([place]) 08/29/2019   Unilateral primary osteoarthritis, right knee 11/03/2021    Tobacco History: Social History   Tobacco Use  Smoking Status Never  Smokeless Tobacco Never   Counseling given: Not Answered   Continue to not smoke  Outpatient Encounter Medications as of 09/28/2022  Medication Sig   albuterol (PROVENTIL) (2.5 MG/3ML) 0.083% nebulizer solution Take 2.5 mg by nebulization every 6 (six) hours as needed for wheezing or shortness of breath.   albuterol (VENTOLIN HFA) 108 (90 Base) MCG/ACT inhaler Inhale 1 puff into the lungs every 6 (six) hours as needed for wheezing or shortness of breath.   ALPRAZolam (XANAX) 0.5 MG tablet Take 0.5 mg by mouth 3 (three) times daily as needed for anxiety.   apixaban (ELIQUIS) 5 MG TABS tablet Take 1 tablet (5 mg total) by mouth 2 (two) times daily.   Cholecalciferol (VITAMIN D3) 125 MCG (5000 UT) CAPS Take 5,000 Units by mouth daily.   CVS SENNA 8.6 MG tablet TAKE 1 TABLET (8.6 MG TOTAL) BY MOUTH EVERY OTHER DAY.   DULoxetine (CYMBALTA) 60 MG capsule Take 120 mg by mouth daily.   FARXIGA 10 MG TABS tablet Take 10 mg by mouth daily.   ferrous sulfate 325 (65 FE) MG tablet Take 325 mg by mouth daily with breakfast.   fexofenadine (ALLEGRA) 180 MG tablet Take 180 mg by mouth daily.   fluticasone-salmeterol  (WIXELA INHUB) 500-50 MCG/ACT AEPB Inhale 1 puff into the lungs in the morning and at bedtime.   Folic Acid 0.8 MG CAPS Take 2 capsules by mouth daily.   hydroxychloroquine (PLAQUENIL) 200 MG tablet Take 400 mg by mouth daily.   lidocaine (LIDODERM) 5 % Place 1 patch onto the skin daily. Remove & Discard patch within 12 hours or as directed by MD   metFORMIN (GLUCOPHAGE) 500 MG tablet Take 500 mg by mouth 2 (two) times daily with a meal.   methocarbamol (ROBAXIN) 500 MG tablet TAKE 1 TABLET BY MOUTH FOUR TIMES A DAY   metoprolol succinate (TOPROL-XL) 25 MG 24 hr tablet Take 2 tablets (50 mg total) by mouth daily. Take with or immediately following a meal.   montelukast (SINGULAIR) 10 MG tablet Take 10 mg by mouth at bedtime.   NYSTATIN powder Apply 1 application  topically daily as needed (under breast/folds of skin).   pantoprazole (PROTONIX) 40 MG tablet Take 40 mg by mouth daily.   polyethylene glycol (MIRALAX / GLYCOLAX) 17 g packet Take 17 g by mouth daily as needed for mild constipation.   predniSONE (DELTASONE) 20 MG tablet Take 2 tablets (40 mg total) by mouth daily with breakfast for 5 days.   pregabalin (LYRICA) 150 MG capsule Take 150 mg by mouth 3 (three) times daily.   rosuvastatin (CRESTOR) 5 MG tablet Take 5 mg by mouth daily.   sacubitril-valsartan (ENTRESTO) 24-26 MG Take 1 tablet by mouth 2 (two) times daily.   spironolactone (ALDACTONE) 25 MG tablet Take 0.5 tablets (12.5 mg total) by mouth daily.   triamcinolone (KENALOG) 0.1 % paste Use as directed 1 Application in the mouth or throat 2 (two) times daily.   umeclidinium bromide (INCRUSE ELLIPTA) 62.5 MCG/ACT AEPB Inhale 1 puff into the lungs daily.   No facility-administered encounter medications on file as of 09/28/2022.     Review of Systems  Review of Systems  N/a Physical Exam  BP 112/62  Pulse (!) 54   Temp 98 F (36.7 C) (Oral)   Ht '4\' 11"'$  (1.499 m)   Wt 172 lb 6.4 oz (78.2 kg)   LMP  (LMP Unknown)    SpO2 97%   BMI 34.82 kg/m   Wt Readings from Last 5 Encounters:  09/28/22 172 lb 6.4 oz (78.2 kg)  09/16/22 171 lb (77.6 kg)  08/18/22 170 lb 6.4 oz (77.3 kg)  07/28/22 171 lb 3.2 oz (77.7 kg)  07/20/22 171 lb 8.3 oz (77.8 kg)    BMI Readings from Last 5 Encounters:  09/28/22 34.82 kg/m  09/16/22 34.54 kg/m  08/18/22 34.42 kg/m  07/28/22 34.58 kg/m  07/20/22 34.64 kg/m     Physical Exam General: Well-appearing, sitting in chair Eyes: EOMI, icterus Neck: Supple, no JVP Pulmonary: Clear, no wheeze Cardiovascular: Regular in rhythm, no murmur Abdomen: Nondistended, bowel sounds present MSK: No synovitis, no joint effusion Neuro: Normal gait, no weakness Psych: Normal mood, full affect   Assessment & Plan:   Dyspnea on exertion: Likely multifactorial.  Contribution of cardiac causes given her reduced EF to 40% as well as left atrial dilation and MVR.  Fortunately right side looks reassuring in terms of pulmonary hypertension.  Additionally, high suspicion for poorly controlled asthma especially with exacerbation or worsening since COVID infection 06/2021.  Improved overall with Breo.  Further improvement with Trelegy high-dose, down to once weekly albuterol use.  Her insurance company, [employer], will not approve Trelegy.  She has worse control since then on Wixela and Incruse for triple inhaled therapy.  Albuterol does provide some temporary improvement.  Severe persistent asthma with exacerbation: Previous diagnosis.  Suspicious for uncontrolled asthma given worsening in symptoms after COVID infection, post viral.  Disease control was adequate on Trelegy high-dose.  She no longer has access to this as her insurance does not cover it and she was denied patient assistance.  This Wixela and Incruse.  Worsening symptoms, using albuterol 1-2 times daily.  Albuterol does provide some temporary relief.  IgE and eos not elevated.  Samples for Trelegy, will represcribe if samples are  beneficial as they were in the past, it was denied in 2023 by her insurance company.  Cheron Every was denied by her insurance company.  Will reengage efforts to get this, I cannot control her symptoms despite good adherence to triple inhaled therapy.  She needs biologic medication for her asthma.  This will require intensive drug therapy monitoring.  Prednisone 40 mg daily x 5 days today for exacerbation.  Return in about 3 months (around 12/28/2022).   Lanier Clam, MD 09/28/2022  I spent 41 minutes in the care of the patient including face-to-face visit, coordination of care, review of records.

## 2022-10-04 ENCOUNTER — Encounter: Payer: Self-pay | Admitting: Pulmonary Disease

## 2022-10-04 ENCOUNTER — Other Ambulatory Visit: Payer: Self-pay

## 2022-10-04 MED ORDER — PREDNISONE 10 MG PO TABS: ORAL_TABLET | ORAL | 0 refills | Status: AC

## 2022-10-06 ENCOUNTER — Encounter: Payer: Self-pay | Admitting: Cardiology

## 2022-10-12 NOTE — Telephone Encounter (Signed)
Mychart message sent by pt: Crystal Marquez  P [facility] (supporting Lanier Clam, MD)9 hours ago (5:23 AM)    I went to urgent care yesterday because I am still wheezing.  They did a chest X-ray and it showed no pneumonia.  She started me on antibiotics, coughing pills, and coughing syrup.   I have been awake since 2:30 wheezing.  I used my Nebulizer and it has not helped at all!   The doctor said that I had bronchitis!  I just don't know what to do.  Please help.   Crystal Marquez     Dr. Silas Flood, please advise.

## 2022-10-12 NOTE — Telephone Encounter (Signed)
If not getting better fear she will need to be seen in ED. Can offer acute visit but if wheezing this long and nebs and prednisone has not helped I think she needs the ED

## 2022-10-13 DIAGNOSIS — R062 Wheezing: Secondary | ICD-10-CM | POA: Insufficient documentation

## 2022-10-19 ENCOUNTER — Emergency Department (HOSPITAL_BASED_OUTPATIENT_CLINIC_OR_DEPARTMENT_OTHER)
Admission: EM | Admit: 2022-10-19 | Discharge: 2022-10-20 | Disposition: A | Discharge status: Home / Self Care | Payer: Commercial Managed Care - HMO | Attending: Emergency Medicine | Admitting: Emergency Medicine

## 2022-10-19 ENCOUNTER — Other Ambulatory Visit: Payer: Self-pay

## 2022-10-19 DIAGNOSIS — Z8616 Personal history of COVID-19: Secondary | ICD-10-CM | POA: Insufficient documentation

## 2022-10-19 DIAGNOSIS — R059 Cough, unspecified: Secondary | ICD-10-CM | POA: Diagnosis present

## 2022-10-19 DIAGNOSIS — J4551 Severe persistent asthma with (acute) exacerbation: Secondary | ICD-10-CM

## 2022-10-19 DIAGNOSIS — Z7984 Long term (current) use of oral hypoglycemic drugs: Secondary | ICD-10-CM | POA: Diagnosis not present

## 2022-10-19 DIAGNOSIS — I129 Hypertensive chronic kidney disease with stage 1 through stage 4 chronic kidney disease, or unspecified chronic kidney disease: Secondary | ICD-10-CM | POA: Insufficient documentation

## 2022-10-19 DIAGNOSIS — Z79899 Other long term (current) drug therapy: Secondary | ICD-10-CM | POA: Insufficient documentation

## 2022-10-19 DIAGNOSIS — N189 Chronic kidney disease, unspecified: Secondary | ICD-10-CM | POA: Insufficient documentation

## 2022-10-19 DIAGNOSIS — E1122 Type 2 diabetes mellitus with diabetic chronic kidney disease: Secondary | ICD-10-CM | POA: Diagnosis not present

## 2022-10-19 DIAGNOSIS — Z7951 Long term (current) use of inhaled steroids: Secondary | ICD-10-CM | POA: Insufficient documentation

## 2022-10-19 NOTE — ED Triage Notes (Signed)
POV from home, sts that she's been having wheezing and sob x couple weeks. Seen pulmonary and given 2 steroids and she sts inhaler hasn't been working or breathing tx q 4 hours without relief.  SOB and wheezing noted at triage. NAD, A&O x 4, steady gait to triage.

## 2022-10-19 NOTE — ED Provider Notes (Incomplete)
[facility] Provider Note: Crystal Spurling, MD, FACEP  CSN: [id number] MRN: [id number] ARRIVAL: 10/19/22 at 2344 ROOM: [place]  10/19/22 11:58 PM Crystal Marquez is a 62 y.o. female who has had a cough and wheezing for the past 3 weeks.  Despite seeing her pulmonologist and having been given 2 steroid shots, and taking albuterol treatments every 4 hours, she is not getting better.  She states her pulmonologist told her if she was not getting better to come to the emergency department.  She is having pain in her chest that is worse with coughing.  She also has a history of a reduced ejection fraction (30%) that was found after she had COVID.   Past Medical History:  Diagnosis Date  . Age-related osteoporosis without current pathological fracture   . Allergic rhinitis   . Anxiety   . Asthma   . B12 deficiency 05/06/2020  . Benign essential HTN 08/29/2019  . 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  . Chronic kidney disease   . Chronic pain syndrome   . Class 2 obesity due to excess calories without serious comorbidity with body mass index (BMI) of 36.0 to 36.9 in adult 08/29/2019  . Colitis 05/06/2020  . Colon polyp   . 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[place]) 05/06/2020  . Coronary atherosclerosis 10/06/2021  . 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  . Dyslipidemia 08/29/2019  . Dyspnea on exertion 10/06/2021  . Fibromyalgia 09/30/2021  . GERD (gastroesophageal reflux disease)   . Hypercalcemia   . Hypertension   . Iron deficiency anemia due to chronic blood loss 05/06/2020  . Low back pain 11/11/2021  . Major depressive disorder, single episode   . Mild intermittent asthma without complication 49/44/9675  . Myopathy   . Neck pain 11/11/2021  . Neuropathy   . Pain in right hand 08/09/2018  . Polyarthralgia 09/30/2021  . Reflux esophagitis   . Sjogren syndrome, unspecified ([place]) 09/30/2021  .  Sjogren's disease ([place])   . Spinal stenosis of lumbar region 12/13/2017  . Tenosynovitis of left hand 08/09/2018  . Type 2 diabetes mellitus without complication, without long-term current use of insulin ([place]) 08/29/2019  . Unilateral primary osteoarthritis, right knee 11/03/2021    Past Surgical History:  Procedure Laterality Date  . ABDOMINAL HYSTERECTOMY    . CARPAL TUNNEL RELEASE Left 2018  . CERVICAL FUSION  10/07/2019   Anterior Cervical Discectomy due to spinal stenosis  . CESAREAN SECTION    . COLONOSCOPY    . UPPER GASTROINTESTINAL ENDOSCOPY      Family History  Problem Relation Age of Onset  . Asthma Mother   . COPD Mother   . Diabetes Mother   . Hypertension Mother   . Kidney disease Mother   . Heart disease Father   . Colon polyps Father   . Skin cancer Father   . Depression Father   . Diabetes Father   . Colon polyps Sister   . Crohn's disease Sister   . Diverticulitis Sister   . Hypercholesterolemia Sister   . Kidney disease Brother   . Heart disease Maternal Grandmother   . Heart disease Paternal Grandmother   . Colon cancer Neg Hx   . Rectal cancer Neg Hx   . Stomach cancer Neg Hx     Social History   Tobacco Use  . Smoking status: Never  . Smokeless tobacco: Never  Vaping Use  . Vaping  Use: Never used  Substance Use Topics  . Alcohol use: No    Alcohol/week: 0.0 standard drinks of alcohol  . Drug use: No    Prior to Admission medications   Medication Sig Start Date End Date Taking? Authorizing Provider  albuterol (PROVENTIL) (2.5 MG/3ML) 0.083% nebulizer solution Take 2.5 mg by nebulization every 6 (six) hours as needed for wheezing or shortness of breath.    [provider]  albuterol (VENTOLIN HFA) 108 (90 Base) MCG/ACT inhaler Inhale 1 puff into the lungs every 6 (six) hours as needed for wheezing or shortness of breath.    [provider]  ALPRAZolam Duanne Moron) 0.5 MG tablet Take 0.5 mg by mouth 3 (three) times daily as  needed for anxiety. 03/15/22   [provider]  apixaban (ELIQUIS) 5 MG TABS tablet Take 1 tablet (5 mg total) by mouth 2 (two) times daily. 07/28/22   Park Liter, MD  Cholecalciferol (VITAMIN D3) 125 MCG (5000 UT) CAPS Take 5,000 Units by mouth daily.    [provider]  [employer] SENNA 8.6 MG tablet TAKE 1 TABLET (8.6 MG TOTAL) BY MOUTH EVERY OTHER DAY. 12/09/21   Daryel November, MD  DULoxetine (CYMBALTA) 60 MG capsule Take 120 mg by mouth daily. 06/20/21   [provider]  FARXIGA 10 MG TABS tablet Take 10 mg by mouth daily. 07/09/22   [provider]  ferrous sulfate 325 (65 FE) MG tablet Take 325 mg by mouth daily with breakfast.    [provider]  fexofenadine (ALLEGRA) 180 MG tablet Take 180 mg by mouth daily. 01/17/22   [provider]  fluticasone-salmeterol (WIXELA INHUB) 500-50 MCG/ACT AEPB Inhale 1 puff into the lungs in the morning and at bedtime. 06/15/22   Hunsucker, Bonna Gains, MD  Folic Acid 0.8 MG CAPS Take 2 capsules by mouth daily.    [provider]  hydroxychloroquine (PLAQUENIL) 200 MG tablet Take 400 mg by mouth daily. 10/17/14   [provider]  lidocaine (LIDODERM) 5 % Place 1 patch onto the skin daily. Remove & Discard patch within 12 hours or as directed by MD 07/21/22   Delene Ruffini, MD  metFORMIN (GLUCOPHAGE) 500 MG tablet Take 500 mg by mouth 2 (two) times daily with a meal.    [provider]  methocarbamol (ROBAXIN) 500 MG tablet TAKE 1 TABLET BY MOUTH FOUR TIMES A DAY 09/14/22   Mcarthur Rossetti, MD  metoprolol succinate (TOPROL-XL) 25 MG 24 hr tablet Take 2 tablets (50 mg total) by mouth daily. Take with or immediately following a meal. 09/16/22   Park Liter, MD  montelukast (SINGULAIR) 10 MG tablet Take 10 mg by mouth at bedtime. 08/09/21   [provider]  NYSTATIN powder Apply 1 application  topically daily as needed (under breast/folds of skin). 07/29/21    [provider]  pantoprazole (PROTONIX) 40 MG tablet Take 40 mg by mouth daily.    [provider]  polyethylene glycol (MIRALAX / GLYCOLAX) 17 g packet Take 17 g by mouth daily as needed for mild constipation.    [provider]  pregabalin (LYRICA) 150 MG capsule Take 150 mg by mouth 3 (three) times daily. 02/20/22   [provider]  rosuvastatin (CRESTOR) 5 MG tablet Take 5 mg by mouth daily.    [provider]  sacubitril-valsartan (ENTRESTO) 24-26 MG Take 1 tablet by mouth 2 (two) times daily. 10/29/21   Park Liter, MD  spironolactone (ALDACTONE) 25  MG tablet Take 0.5 tablets (12.5 mg total) by mouth daily. 08/18/22   Park Liter, MD  triamcinolone (KENALOG) 0.1 % paste Use as directed 1 Application in the mouth or throat 2 (two) times daily. 07/07/22   [provider]  umeclidinium bromide (INCRUSE ELLIPTA) 62.5 MCG/ACT AEPB Inhale 1 puff into the lungs daily. 06/15/22   Hunsucker, Bonna Gains, MD    Allergies Lisinopril   REVIEW OF SYSTEMS  Negative except as noted here or in the History of Present Illness.   PHYSICAL EXAMINATION  Initial Vital Signs Blood pressure 124/69, pulse 82, temperature 98.2 F (36.8 C), resp. rate (!) 24, SpO2 98 %.  Examination General: Well-developed, well-nourished female in no acute distress; appearance consistent with age of record HENT: normocephalic; atraumatic Eyes: pupils equal, round and reactive to light; extraocular muscles intact Neck: supple Heart: regular rate and rhythm; no murmurs, rubs or gallops Lungs: clear to auscultation bilaterally Abdomen: soft; nondistended; nontender; no masses or hepatosplenomegaly; bowel sounds present Extremities: No deformity; full range of motion; pulses normal Neurologic: Awake, alert and oriented; motor function intact in all extremities and symmetric; no facial droop Skin: Warm and dry Psychiatric: Normal mood and  affect   RESULTS  Summary of this visit's results, reviewed and interpreted by myself:   EKG Interpretation  Date/Time:    Ventricular Rate:    PR Interval:    QRS Duration:   QT Interval:    QTC Calculation:   R Axis:     Text Interpretation:         Laboratory Studies: No results found for this or any previous visit (from the past 24 hour(s)). Imaging Studies: No results found.  ED COURSE and MDM  Nursing notes, initial and subsequent vitals signs, including pulse oximetry, reviewed and interpreted by myself.  Vitals:   10/19/22 2354 10/19/22 2355 10/19/22 2357  BP:   124/69  Pulse:  82   Resp:  (!) 24   Temp:   98.2 F (36.8 C)  SpO2: 100%  98%   Medications - No data to display    PROCEDURES  Procedures   ED DIAGNOSES  No diagnosis found.

## 2022-10-19 NOTE — ED Provider Notes (Signed)
[facility] Provider Note: Georgena Spurling, MD, FACEP  CSN: [id number] MRN: [id number] ARRIVAL: 10/19/22 at 2344 ROOM: [place]  10/19/22 11:58 PM Crystal Marquez is a 62 y.o. female who has had a cough and wheezing for the past 3 weeks, worse with exertion.  She saw her pulmonologist on 09/28/2022 and was placed on Trelegy in place of the [place] she had been on.  She was also put on prednisone 40 mg for 5 days.  She had transient improvement but her symptoms have subsequently recurred.  She is not getting adequate relief with albuterol treatments every 4 hours.  She states her pulmonologist told her if she was not getting better to come to the emergency department.  She is having pain in her chest that is worse with coughing.  She also has a history of a reduced ejection fraction (30%) that was found after she had COVID.  She is followed by cardiology for this.   Past Medical History:  Diagnosis Date   Age-related osteoporosis without current pathological fracture    Allergic rhinitis    Anxiety    Asthma    B12 deficiency 05/06/2020   Benign essential HTN 08/29/2019   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   Chronic kidney disease    Chronic pain syndrome    Class 2 obesity due to excess calories without serious comorbidity with body mass index (BMI) of 36.0 to 36.9 in adult 08/29/2019   Colitis 05/06/2020   Colon polyp    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[place]) 05/06/2020   Coronary atherosclerosis 10/06/2021   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   Dyslipidemia 08/29/2019   Dyspnea on exertion 10/06/2021   Fibromyalgia 09/30/2021   GERD (gastroesophageal reflux disease)    Hypercalcemia    Hypertension    Iron deficiency anemia due to chronic blood loss 05/06/2020   Low back pain 11/11/2021   Major depressive disorder, single episode    Mild intermittent asthma without complication  46/96/2952   Myopathy    Neck pain 11/11/2021   Neuropathy    Pain in right hand 08/09/2018   Polyarthralgia 09/30/2021   Reflux esophagitis    Sjogren syndrome, unspecified ([place]) 09/30/2021   Sjogren's disease ([place])    Spinal stenosis of lumbar region 12/13/2017   Tenosynovitis of left hand 08/09/2018   Type 2 diabetes mellitus without complication, without long-term current use of insulin ([place]) 08/29/2019   Unilateral primary osteoarthritis, right knee 11/03/2021    Past Surgical History:  Procedure Laterality Date   ABDOMINAL HYSTERECTOMY     CARPAL TUNNEL RELEASE Left 2018   CERVICAL FUSION  10/07/2019   Anterior Cervical Discectomy due to spinal stenosis   CESAREAN SECTION     COLONOSCOPY     UPPER GASTROINTESTINAL ENDOSCOPY      Family History  Problem Relation Age of Onset   Asthma Mother    COPD Mother    Diabetes Mother    Hypertension Mother    Kidney disease Mother    Heart disease Father    Colon polyps Father    Skin cancer Father    Depression Father    Diabetes Father    Colon polyps Sister    Crohn's disease Sister    Diverticulitis Sister    Hypercholesterolemia Sister    Kidney disease Brother    Heart disease Maternal Grandmother    Heart disease Paternal Grandmother  Colon cancer Neg Hx    Rectal cancer Neg Hx    Stomach cancer Neg Hx     Social History   Tobacco Use   Smoking status: Never   Smokeless tobacco: Never  Vaping Use   Vaping Use: Never used  Substance Use Topics   Alcohol use: No    Alcohol/week: 0.0 standard drinks of alcohol   Drug use: No    Prior to Admission medications   Medication Sig Start Date End Date Taking? Authorizing Provider  albuterol (PROVENTIL) (2.5 MG/3ML) 0.083% nebulizer solution Take 2.5 mg by nebulization every 6 (six) hours as needed for wheezing or shortness of breath.    [provider]  albuterol (VENTOLIN HFA) 108 (90 Base) MCG/ACT inhaler Inhale 1 puff into the lungs every 6 (six)  hours as needed for wheezing or shortness of breath.    [provider]  ALPRAZolam Duanne Moron) 0.5 MG tablet Take 0.5 mg by mouth 3 (three) times daily as needed for anxiety. 03/15/22   [provider]  apixaban (ELIQUIS) 5 MG TABS tablet Take 1 tablet (5 mg total) by mouth 2 (two) times daily. 07/28/22   Park Liter, MD  Cholecalciferol (VITAMIN D3) 125 MCG (5000 UT) CAPS Take 5,000 Units by mouth daily.    [provider]  [employer] SENNA 8.6 MG tablet TAKE 1 TABLET (8.6 MG TOTAL) BY MOUTH EVERY OTHER DAY. 12/09/21   Daryel November, MD  DULoxetine (CYMBALTA) 60 MG capsule Take 120 mg by mouth daily. 06/20/21   [provider]  FARXIGA 10 MG TABS tablet Take 10 mg by mouth daily. 07/09/22   [provider]  ferrous sulfate 325 (65 FE) MG tablet Take 325 mg by mouth daily with breakfast.    [provider]  fexofenadine (ALLEGRA) 180 MG tablet Take 180 mg by mouth daily. 01/17/22   [provider]  fluticasone-salmeterol (WIXELA INHUB) 500-50 MCG/ACT AEPB Inhale 1 puff into the lungs in the morning and at bedtime. 06/15/22   Hunsucker, Bonna Gains, MD  Folic Acid 0.8 MG CAPS Take 2 capsules by mouth daily.    [provider]  hydroxychloroquine (PLAQUENIL) 200 MG tablet Take 400 mg by mouth daily. 10/17/14   [provider]  lidocaine (LIDODERM) 5 % Place 1 patch onto the skin daily. Remove & Discard patch within 12 hours or as directed by MD 07/21/22   Delene Ruffini, MD  metFORMIN (GLUCOPHAGE) 500 MG tablet Take 500 mg by mouth 2 (two) times daily with a meal.    [provider]  methocarbamol (ROBAXIN) 500 MG tablet TAKE 1 TABLET BY MOUTH FOUR TIMES A DAY 09/14/22   Mcarthur Rossetti, MD  metoprolol succinate (TOPROL-XL) 25 MG 24 hr tablet Take 2 tablets (50 mg total) by mouth daily. Take with or immediately following a meal. 09/16/22   Park Liter, MD  montelukast (SINGULAIR) 10 MG tablet Take 10  mg by mouth at bedtime. 08/09/21   [provider]  NYSTATIN powder Apply 1 application  topically daily as needed (under breast/folds of skin). 07/29/21   [provider]  pantoprazole (PROTONIX) 40 MG tablet Take 40 mg by mouth daily.    [provider]  polyethylene glycol (MIRALAX / GLYCOLAX) 17 g packet Take 17 g by mouth daily as needed for mild constipation.    [provider]  pregabalin (LYRICA) 150 MG capsule Take 150 mg by mouth 3 (three) times daily. 02/20/22   [provider]  rosuvastatin (CRESTOR) 5 MG tablet Take 5 mg by mouth daily.    [provider]  sacubitril-valsartan (ENTRESTO) 24-26 MG Take 1 tablet by mouth 2 (two) times daily. 10/29/21   Park Liter, MD  spironolactone (ALDACTONE) 25 MG tablet Take 0.5 tablets (12.5 mg total) by mouth daily. 08/18/22   Park Liter, MD  triamcinolone (KENALOG) 0.1 % paste Use as directed 1 Application in the mouth or throat 2 (two) times daily. 07/07/22   [provider]  umeclidinium bromide (INCRUSE ELLIPTA) 62.5 MCG/ACT AEPB Inhale 1 puff into the lungs daily. 06/15/22   Hunsucker, Bonna Gains, MD    Allergies Lisinopril   REVIEW OF SYSTEMS  Negative except as noted here or in the History of Present Illness.   PHYSICAL EXAMINATION  Initial Vital Signs Blood pressure 124/69, pulse 82, temperature 98.2 F (36.8 C), resp. rate (!) 24, SpO2 98 %.  Examination General: Well-developed, well-nourished female in no acute distress; appearance consistent with age of record HENT: normocephalic; atraumatic Eyes: Normal appearance Neck: supple Heart: regular rate and rhythm Lungs: Tachypnea; inspiratory and expiratory wheezing; frequent cough Abdomen: soft; nondistended; nontender; no masses or hepatosplenomegaly; bowel sounds present Extremities: No deformity; full range of motion; pulses normal Neurologic: Awake, alert and oriented; motor function intact in all  extremities and symmetric; no facial droop Skin: Warm and dry Psychiatric: Normal mood and affect   RESULTS  Summary of this visit's results, reviewed and interpreted by myself:   EKG Interpretation  Date/Time:  Thursday October 20 2022 00:08:04 EST Ventricular Rate:  66 PR Interval:  143 QRS Duration: 93 QT Interval:  419 QTC Calculation: 439 R Axis:   32 Text Interpretation: Sinus rhythm Normal ECG No significant change was found Confirmed by Shanon Rosser [phone]) on 10/20/2022 12:10:29 AM       Laboratory Studies: Results for orders placed or performed during the hospital encounter of 10/19/22 (from the past 24 hour(s))  Troponin I (High Sensitivity)     Status: None   Collection Time: 10/20/22 12:15 AM  Result Value Ref Range   Troponin I (High Sensitivity) 8 <18 ng/L  Brain natriuretic peptide     Status: Abnormal   Collection Time: 10/20/22 12:15 AM  Result Value Ref Range   B Natriuretic Peptide 104.2 (H) 0.0 - 100.0 pg/mL  CBC with Differential     Status: Abnormal   Collection Time: 10/20/22 12:15 AM  Result Value Ref Range   WBC 12.2 (H) 4.0 - 10.5 K/uL   RBC 4.93 3.87 - 5.11 MIL/uL   Hemoglobin 14.6 12.0 - 15.0 g/dL   HCT 45.5 36.0 - 46.0 %   MCV 92.3 80.0 - 100.0 fL   MCH 29.6 26.0 - 34.0 pg   MCHC 32.1 30.0 - 36.0 g/dL   RDW 13.9 11.5 - 15.5 %   Platelets 163 150 - 400 K/uL   nRBC 0.0 0.0 - 0.2 %   Neutrophils Relative % 68 %   Neutro Abs 8.3 (H) 1.7 - 7.7 K/uL   Lymphocytes Relative 22 %   Lymphs Abs 2.7 0.7 - 4.0 K/uL   Monocytes Relative 7 %   Monocytes Absolute 0.9 0.1 - 1.0 K/uL   Eosinophils Relative 2 %   Eosinophils Absolute 0.2 0.0 - 0.5 K/uL   Basophils Relative 0 %   Basophils Absolute 0.0 0.0 - 0.1 K/uL   Immature Granulocytes 1 %   Abs Immature Granulocytes 0.06 0.00 - 0.07 K/uL  Basic metabolic  panel     Status: Abnormal   Collection Time: 10/20/22 12:15 AM  Result Value Ref Range   Sodium 142 135 - 145 mmol/L   Potassium 4.8 3.5 -  5.1 mmol/L   Chloride 104 98 - 111 mmol/L   CO2 30 22 - 32 mmol/L   Glucose, Bld 134 (H) 70 - 99 mg/dL   BUN 16 6 - 20 mg/dL   Creatinine, Ser 0.99 0.44 - 1.00 mg/dL   Calcium 9.1 8.9 - 10.3 mg/dL   GFR, Estimated >60 >60 mL/min   Anion gap 8 5 - 15   Imaging Studies: DG Chest 2 View  Result Date: 10/20/2022 CLINICAL DATA:  Shortness of breath and wheezing. EXAM: CHEST - 2 VIEW COMPARISON:  07/27/2022. FINDINGS: The heart is enlarged and the mediastinal contour is within normal limits. No consolidation, effusion, or pneumothorax. Cervical spinal fusion hardware is noted. Mild degenerative changes in the thoracic spine. IMPRESSION: No active cardiopulmonary disease. Electronically Signed   By: Brett Fairy M.D.   On: 10/20/2022 00:29    ED COURSE and MDM  Nursing notes, initial and subsequent vitals signs, including pulse oximetry, reviewed and interpreted by myself.  Vitals:   10/20/22 0022 10/20/22 0117 10/20/22 0120 10/20/22 0130  BP:   112/64 (!) 108/54  Pulse:   62 66  Resp:   14 14  Temp:      SpO2: 94% 97% 99% 96%   Medications  ipratropium-albuterol (DUONEB) 0.5-2.5 (3) MG/3ML nebulizer solution 3 mL (3 mLs Nebulization Given 10/20/22 0022)  albuterol (PROVENTIL) (2.5 MG/3ML) 0.083% nebulizer solution 2.5 mg (2.5 mg Nebulization Given 10/20/22 0022)  methylPREDNISolone sodium succinate (SOLU-MEDROL) 125 mg/2 mL injection 125 mg (125 mg Intravenous Given 10/20/22 0126)  albuterol (PROVENTIL) (2.5 MG/3ML) 0.083% nebulizer solution 5 mg (5 mg Nebulization Given 10/20/22 0117)   1:47 AM Lungs nearly clear after second neb treatment.  Patient more comfortable and states she feels like going home.  She is no longer tachypneic.  She was given a dose of Solu-Medrol IV here and we will place her on steroids for the next 4 days and refer back to her pulmonologist.  No ischemic changes on EKG, troponin is normal and no pulmonary edema seen on chest x-ray.   PROCEDURES  Procedures   ED  DIAGNOSES     ICD-10-CM   1. Severe persistent asthma with acute exacerbation  J45.51          Luna Audia, MD 10/20/22 [phone]

## 2022-10-20 ENCOUNTER — Emergency Department (HOSPITAL_BASED_OUTPATIENT_CLINIC_OR_DEPARTMENT_OTHER): Payer: Commercial Managed Care - HMO | Admitting: Radiology

## 2022-10-20 LAB — CBC WITH DIFFERENTIAL/PLATELET
Abs Immature Granulocytes: 0.06 10*3/uL (ref 0.00–0.07)
Basophils Absolute: 0 10*3/uL (ref 0.0–0.1)
Basophils Relative: 0 %
Eosinophils Absolute: 0.2 10*3/uL (ref 0.0–0.5)
Eosinophils Relative: 2 %
HCT: 45.5 % (ref 36.0–46.0)
Hemoglobin: 14.6 g/dL (ref 12.0–15.0)
Immature Granulocytes: 1 %
Lymphocytes Relative: 22 %
Lymphs Abs: 2.7 10*3/uL (ref 0.7–4.0)
MCH: 29.6 pg (ref 26.0–34.0)
MCHC: 32.1 g/dL (ref 30.0–36.0)
MCV: 92.3 fL (ref 80.0–100.0)
Monocytes Absolute: 0.9 10*3/uL (ref 0.1–1.0)
Monocytes Relative: 7 %
Neutro Abs: 8.3 10*3/uL — ABNORMAL HIGH (ref 1.7–7.7)
Neutrophils Relative %: 68 %
Platelets: 163 10*3/uL (ref 150–400)
RBC: 4.93 MIL/uL (ref 3.87–5.11)
RDW: 13.9 % (ref 11.5–15.5)
WBC: 12.2 10*3/uL — ABNORMAL HIGH (ref 4.0–10.5)
nRBC: 0 % (ref 0.0–0.2)

## 2022-10-20 LAB — BASIC METABOLIC PANEL
Anion gap: 8 (ref 5–15)
BUN: 16 mg/dL (ref 6–20)
CO2: 30 mmol/L (ref 22–32)
Calcium: 9.1 mg/dL (ref 8.9–10.3)
Chloride: 104 mmol/L (ref 98–111)
Creatinine, Ser: 0.99 mg/dL (ref 0.44–1.00)
GFR, Estimated: >60 mL/min (ref >60)
Glucose, Bld: 134 mg/dL — ABNORMAL HIGH (ref 70–99)
Potassium: 4.8 mmol/L (ref 3.5–5.1)
Sodium: 142 mmol/L (ref 135–145)

## 2022-10-20 LAB — BRAIN NATRIURETIC PEPTIDE: B Natriuretic Peptide: 104.2 pg/mL — ABNORMAL HIGH (ref 0.0–100.0)

## 2022-10-20 LAB — TROPONIN I (HIGH SENSITIVITY): Troponin I (High Sensitivity): 8 ng/L (ref <18)

## 2022-10-20 MED ORDER — PREDNISONE 20 MG PO TABS: ORAL_TABLET | ORAL | 0 refills | Status: DC

## 2022-10-20 MED ORDER — METHYLPREDNISOLONE SODIUM SUCC 125 MG IJ SOLR
125.0000 mg | Freq: Once | INTRAMUSCULAR | Status: AC
  Administered 2022-10-20: 125 mg via INTRAVENOUS
  Filled 2022-10-20: qty 2

## 2022-10-20 MED ORDER — ALBUTEROL SULFATE (2.5 MG/3ML) 0.083% IN NEBU
2.5000 mg | INHALATION_SOLUTION | Freq: Once | RESPIRATORY_TRACT | Status: AC
  Administered 2022-10-20: 2.5 mg via RESPIRATORY_TRACT
  Filled 2022-10-20: qty 3

## 2022-10-20 MED ORDER — IPRATROPIUM-ALBUTEROL 0.5-2.5 (3) MG/3ML IN SOLN
3.0000 mL | RESPIRATORY_TRACT | Status: DC
  Administered 2022-10-20: 3 mL via RESPIRATORY_TRACT
  Filled 2022-10-20: qty 3

## 2022-10-20 MED ORDER — ALBUTEROL SULFATE (2.5 MG/3ML) 0.083% IN NEBU
5.0000 mg | INHALATION_SOLUTION | Freq: Once | RESPIRATORY_TRACT | Status: AC
  Administered 2022-10-20: 5 mg via RESPIRATORY_TRACT
  Filled 2022-10-20: qty 6

## 2022-10-20 NOTE — Telephone Encounter (Signed)
Sounds like Feb 12 is only option - that is ok

## 2022-10-20 NOTE — Telephone Encounter (Signed)
Called and spoke with pt and have scheduled her an appt with TP for HFU. Nothing further needed.

## 2022-10-20 NOTE — Telephone Encounter (Signed)
Called patient to get her setup for a hospital follow up before her trip on February 7th but at the time of call we did not have anything open. The soonest appt with MH isn't until the end of the month. The NPs did not have anything until Feb. 12th. I looked at her discharge summary and it did mention a follow up ASAP with pulmonary. She wanted to know if MH could review her hospital notes to see if she is ok to wait that long for an appt.   Dr. Silas Flood, can you please advise? Thanks!

## 2022-10-20 NOTE — ED Notes (Signed)
Patient back from x-ray 

## 2022-10-24 ENCOUNTER — Encounter: Payer: Self-pay | Admitting: Pharmacist

## 2022-10-24 NOTE — Telephone Encounter (Signed)
Error

## 2022-10-25 DIAGNOSIS — Z20828 Contact with and (suspected) exposure to other viral communicable diseases: Secondary | ICD-10-CM | POA: Insufficient documentation

## 2022-10-25 DIAGNOSIS — J019 Acute sinusitis, unspecified: Secondary | ICD-10-CM | POA: Insufficient documentation

## 2022-10-31 ENCOUNTER — Ambulatory Visit: Payer: Commercial Managed Care - HMO | Admitting: Adult Health

## 2022-10-31 ENCOUNTER — Encounter: Payer: Self-pay | Admitting: Adult Health

## 2022-10-31 VITALS — BP 110/70 | HR 62 | Temp 98.0°F | Ht 59.0 in | Wt 167.0 lb

## 2022-10-31 DIAGNOSIS — Z23 Encounter for immunization: Secondary | ICD-10-CM

## 2022-10-31 DIAGNOSIS — J4550 Severe persistent asthma, uncomplicated: Secondary | ICD-10-CM | POA: Diagnosis not present

## 2022-10-31 DIAGNOSIS — J309 Allergic rhinitis, unspecified: Secondary | ICD-10-CM | POA: Diagnosis not present

## 2022-10-31 MED ORDER — BENZONATATE 200 MG PO CAPS: 200.0000 mg | ORAL_CAPSULE | Freq: Three times a day (TID) | ORAL | 3 refills | Status: DC | PRN

## 2022-10-31 NOTE — Progress Notes (Signed)
$@Patientk$  ID: Crystal Marquez, female    DOB: 07-06-1961, 62 y.o.   MRN: [id number]  Chief Complaint  Patient presents with   Hospitalization Follow-up    Referring provider: Rhea Bleacher, NP  HPI: 62 year old female never smoker followed for asthma Medical history significant for diabetes, congestive heart failure, A-fib, Sjogren's syndrome  TEST/EVENTS :  CT chest August 23, 2021 slightly heterogenous pulmonary continuation, chronic atelectasis in the right lower lobe, small scattered pulmonary nodules (stable since 2019)  Coronary CT chest August 29, 2022 report shows lungs unremarkable  PFTs August 15, 2022 showed normal lung function with FEV1 at 94%, ratio 88, FVC 83%, no significant bronchodilator response,  DLCO 112%.  IgE in March 25, 2022 - 18   10/31/2022 Asthma, ER follow up  Patient returns for a follow-up visit.  Patient was seen last month for a recurrent asthma flare.  Patient says over the last 4 to 6 weeks she has had recurrent cough wheezing.  She has been on 4 separate courses of prednisone with no significant resolution of cough or wheezing.  Patient says she will get better briefly and then cough and wheezing will restart.  Cough and wheezing are worse at nighttime.  She is on Wixela and Incruse but was changed to Trelegy last time but did not see any change in her symptoms.  She has now restarted back on her Wixela and increase that is covered by her insurance. Patient does have Sjogren's is been a long standing Plaquenil.  She also has congestive heart failure and is on Entresto.  Cough is minimally productive and mainly dry she also has associated shortness of breath and wheezing.  She was seen in the emergency room 2 weeks ago and given another course of steroids which did not change her symptoms.  She has been treated for allergy symptoms with Singulair and Allegra.  She is also on GERD treatment with PPI therapy.  She denies any hemoptysis, chest pain, orthopnea,  increased edema.  Chest x-ray done in the emergency room on October 20, 2022 showed clear lungs.      Allergies  Allergen Reactions   Lisinopril Shortness Of Breath    Bloating     Immunization History  Administered Date(s) Administered   Influenza,inj,Quad PF,6+ Mos 10/31/2022   Influenza,inj,quad, With Preservative 06/19/2017   Influenza-Unspecified 06/28/2021   PFIZER(Purple Top)SARS-COV-2 Vaccination 12/19/2019, 01/13/2020    Past Medical History:  Diagnosis Date   Age-related osteoporosis without current pathological fracture    Allergic rhinitis    Anxiety    Asthma    B12 deficiency 05/06/2020   Benign essential HTN 08/29/2019   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   Chronic kidney disease    Chronic pain syndrome    Class 2 obesity due to excess calories without serious comorbidity with body mass index (BMI) of 36.0 to 36.9 in adult 08/29/2019   Colitis 05/06/2020   Colon polyp    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[place]) 05/06/2020   Coronary atherosclerosis 10/06/2021   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   Dyslipidemia 08/29/2019   Dyspnea on exertion 10/06/2021   Fibromyalgia 09/30/2021   GERD (gastroesophageal reflux disease)    Hypercalcemia    Hypertension    Iron deficiency anemia due to chronic blood loss 05/06/2020   Low back pain 11/11/2021   Major depressive disorder, single episode    Mild intermittent asthma without complication [id number]   Myopathy    Neck pain 11/11/2021   Neuropathy  Pain in right hand 08/09/2018   Polyarthralgia 09/30/2021   Reflux esophagitis    Sjogren syndrome, unspecified ([place]) 09/30/2021   Sjogren's disease ([place])    Spinal stenosis of lumbar region 12/13/2017   Tenosynovitis of left hand 08/09/2018   Type 2 diabetes mellitus without complication, without long-term current use of insulin ([place]) 08/29/2019   Unilateral primary osteoarthritis, right knee 11/03/2021    Tobacco History: Social History    Tobacco Use  Smoking Status Never  Smokeless Tobacco Never   Counseling given: Not Answered   Outpatient Medications Prior to Visit  Medication Sig Dispense Refill   albuterol (PROVENTIL) (2.5 MG/3ML) 0.083% nebulizer solution Take 2.5 mg by nebulization every 6 (six) hours as needed for wheezing or shortness of breath.     albuterol (VENTOLIN HFA) 108 (90 Base) MCG/ACT inhaler Inhale 1 puff into the lungs every 6 (six) hours as needed for wheezing or shortness of breath.     ALPRAZolam (XANAX) 0.5 MG tablet Take 0.5 mg by mouth 3 (three) times daily as needed for anxiety.     apixaban (ELIQUIS) 5 MG TABS tablet Take 1 tablet (5 mg total) by mouth 2 (two) times daily. 60 tablet 5   Cholecalciferol (VITAMIN D3) 125 MCG (5000 UT) CAPS Take 5,000 Units by mouth daily.     [employer] SENNA 8.6 MG tablet TAKE 1 TABLET (8.6 MG TOTAL) BY MOUTH EVERY OTHER DAY. 30 tablet 2   DULoxetine (CYMBALTA) 60 MG capsule Take 120 mg by mouth daily.     FARXIGA 10 MG TABS tablet Take 10 mg by mouth daily.     ferrous sulfate 325 (65 FE) MG tablet Take 325 mg by mouth daily with breakfast.     fexofenadine (ALLEGRA) 180 MG tablet Take 180 mg by mouth daily.     fluticasone-salmeterol (WIXELA INHUB) 500-50 MCG/ACT AEPB Inhale 1 puff into the lungs in the morning and at bedtime. 60 each 6   Folic Acid 0.8 MG CAPS Take 2 capsules by mouth daily.     hydroxychloroquine (PLAQUENIL) 200 MG tablet Take 400 mg by mouth daily.  0   lidocaine (LIDODERM) 5 % Place 1 patch onto the skin daily. Remove & Discard patch within 12 hours or as directed by MD 30 patch 0   metFORMIN (GLUCOPHAGE) 500 MG tablet Take 500 mg by mouth 2 (two) times daily with a meal.     methocarbamol (ROBAXIN) 500 MG tablet TAKE 1 TABLET BY MOUTH FOUR TIMES A DAY 40 tablet 1   metoprolol succinate (TOPROL-XL) 25 MG 24 hr tablet Take 2 tablets (50 mg total) by mouth daily. Take with or immediately following a meal. 90 tablet 3   montelukast (SINGULAIR) 10  MG tablet Take 10 mg by mouth at bedtime.     NYSTATIN powder Apply 1 application  topically daily as needed (under breast/folds of skin).     pantoprazole (PROTONIX) 40 MG tablet Take 40 mg by mouth daily.     polyethylene glycol (MIRALAX / GLYCOLAX) 17 g packet Take 17 g by mouth daily as needed for mild constipation.     pregabalin (LYRICA) 150 MG capsule Take 150 mg by mouth 3 (three) times daily.     rosuvastatin (CRESTOR) 5 MG tablet Take 5 mg by mouth daily.     sacubitril-valsartan (ENTRESTO) 24-26 MG Take 1 tablet by mouth 2 (two) times daily. 60 tablet 12   spironolactone (ALDACTONE) 25 MG tablet Take 0.5 tablets (12.5 mg total) by mouth daily.  45 tablet 3   triamcinolone (KENALOG) 0.1 % paste Use as directed 1 Application in the mouth or throat 2 (two) times daily.     umeclidinium bromide (INCRUSE ELLIPTA) 62.5 MCG/ACT AEPB Inhale 1 puff into the lungs daily. 30 each 6   predniSONE (DELTASONE) 20 MG tablet Take 2 tablets daily starting 10/21/2022. (Patient not taking: Reported on 10/31/2022) 8 tablet 0   No facility-administered medications prior to visit.     Review of Systems:   Constitutional:   No  weight loss, night sweats,  Fevers, chills, fatigue, or  lassitude.  HEENT:   No headaches,  Difficulty swallowing,  Tooth/dental problems, or  Sore throat,                No sneezing, itching, ear ache,  +nasal congestion, post nasal drip,   CV:  No chest pain,  Orthopnea, PND, swelling in lower extremities, anasarca, dizziness, palpitations, syncope.   GI  No heartburn, indigestion, abdominal pain, nausea, vomiting, diarrhea, change in bowel habits, loss of appetite, bloody stools.   Resp:   No chest wall deformity  Skin: no rash or lesions.  GU: no dysuria, change in color of urine, no urgency or frequency.  No flank pain, no hematuria   MS:  No joint pain or swelling.  No decreased range of motion.  No back pain.    Physical Exam  BP 110/70 (BP Location: Left Arm,  Patient Position: Sitting, Cuff Size: Large)   Pulse 62   Temp 98 F (36.7 C) (Oral)   Ht 4' 11"$  (1.499 m)   Wt 167 lb (75.8 kg)   LMP  (LMP Unknown)   SpO2 97%   BMI 33.73 kg/m   GEN: A/Ox3; pleasant , NAD, well nourished    HEENT:  [place]/AT,   NOSE-clear, THROAT-clear, no lesions, no postnasal drip or exudate noted.  Poor dentition  NECK:  Supple w/ fair ROM; no JVD; normal carotid impulses w/o bruits; no thyromegaly or nodules palpated; no lymphadenopathy.    RESP  Clear  P & A; w/o, wheezes/ rales/ or rhonchi. no accessory muscle use, no dullness to percussion  CARD:  RRR, no m/r/g, no peripheral edema, pulses intact, no cyanosis or clubbing.  GI:   Soft & nt; nml bowel sounds; no organomegaly or masses detected.   Musco: Warm bil, no deformities or joint swelling noted.   Neuro: alert, no focal deficits noted.    Skin: Warm, no lesions or rashes    Lab Results:  CBC    Component Value Date/Time   WBC 12.2 (H) 10/20/2022 0015   RBC 4.93 10/20/2022 0015   HGB 14.6 10/20/2022 0015   HGB 15.6 07/28/2022 1603   HCT 45.5 10/20/2022 0015   HCT 46.6 07/28/2022 1603   PLT 163 10/20/2022 0015   PLT 178 07/28/2022 1603   MCV 92.3 10/20/2022 0015   MCV 88 07/28/2022 1603   MCH 29.6 10/20/2022 0015   MCHC 32.1 10/20/2022 0015   RDW 13.9 10/20/2022 0015   RDW 14.1 07/28/2022 1603   LYMPHSABS 2.7 10/20/2022 0015   LYMPHSABS 1.6 07/28/2022 1603   MONOABS 0.9 10/20/2022 0015   EOSABS 0.2 10/20/2022 0015   EOSABS 0.4 07/28/2022 1603   BASOSABS 0.0 10/20/2022 0015   BASOSABS 0.0 07/28/2022 1603    BMET    Component Value Date/Time   NA 142 10/20/2022 0015   NA 144 08/25/2022 1339   K 4.8 10/20/2022 0015   CL 104 10/20/2022 0015  CO2 30 10/20/2022 0015   GLUCOSE 134 (H) 10/20/2022 0015   BUN 16 10/20/2022 0015   BUN 21 08/25/2022 1339   CREATININE 0.99 10/20/2022 0015   CALCIUM 9.1 10/20/2022 0015   GFRNONAA >60 10/20/2022 0015    BNP    Component Value  Date/Time   BNP 104.2 (H) 10/20/2022 0015    ProBNP No results found for: "PROBNP"  Imaging: DG Chest 2 View  Result Date: 10/20/2022 CLINICAL DATA:  Shortness of breath and wheezing. EXAM: CHEST - 2 VIEW COMPARISON:  07/27/2022. FINDINGS: The heart is enlarged and the mediastinal contour is within normal limits. No consolidation, effusion, or pneumothorax. Cervical spinal fusion hardware is noted. Mild degenerative changes in the thoracic spine. IMPRESSION: No active cardiopulmonary disease. Electronically Signed   By: Brett Fairy M.D.   On: 10/20/2022 00:29         Latest Ref Rng & Units 08/15/2022    3:48 PM  PFT Results  FVC-Pre L 2.18   FVC-Predicted Pre % 79   FVC-Post L 2.30   FVC-Predicted Post % 83   Pre FEV1/FVC % % 85   Post FEV1/FCV % % 88   FEV1-Pre L 1.86   FEV1-Predicted Pre % 87   FEV1-Post L 2.02   DLCO uncorrected ml/min/mmHg 19.17   DLCO UNC% % 112   DLCO corrected ml/min/mmHg 18.06   DLCO COR %Predicted % 106   DLVA Predicted % 121   TLC L 3.98   TLC % Predicted % 92   RV % Predicted % 43     No results found for: "NITRICOXIDE"      Assessment & Plan:   Asthma Difficult to control severe persistent asthma.  Despite being on triple therapy maintenance regimen, trigger prevention with Singulair, Allegra and PPI therapy she continues to have ongoing symptoms.  Previous PFTs showed no significant airflow obstruction or restriction.  Will add in additional GERD treatment and cough control regimen to help with cough. Last visit patient was recommended on test by therapy.  Will check with pharmacy team to determine status  Plan  Patient Instructions  Begin Delsym 2 tsp Twice daily  for cough As needed   Begin Tessalon Three times a day  for cough as needed  Begin Chlorpheniramine 81m At bedtime  for drainage As needed   Begin Pepcid 232mAt bedtime   Continue on Singulair and Allegra daily  Continue on Wixela and Incruse -rinse after use.   Albuterol inhaler /neb As needed   Sips of water to soothe throat and avoid coughing or throat clearing  NO Mints  Flu shot today .  Follow up with Dentist as discussed  Follow up with Dr HuSilas Floodn 6 weeks and As needed   Please contact office for sooner follow up if symptoms do not improve or worsen or seek emergency care       Allergic rhinitis Add chlor tabs at bedtime as needed  Plan  Patient Instructions  Begin Delsym 2 tsp Twice daily  for cough As needed   Begin Tessalon Three times a day  for cough as needed  Begin Chlorpheniramine 45m25mt bedtime  for drainage As needed   Begin Pepcid 58m38m bedtime   Continue on Singulair and Allegra daily  Continue on Wixela and Incruse -rinse after use.  Albuterol inhaler /neb As needed   Sips of water to soothe throat and avoid coughing or throat clearing  NO Mints  Flu shot today .  Follow up with Dentist as discussed  Follow up with Dr Silas Flood in 6 weeks and As needed   Please contact office for sooner follow up if symptoms do not improve or worsen or seek emergency care         Rexene Edison, NP 10/31/2022

## 2022-10-31 NOTE — Assessment & Plan Note (Signed)
Difficult to control severe persistent asthma.  Despite being on triple therapy maintenance regimen, trigger prevention with Singulair, Allegra and PPI therapy she continues to have ongoing symptoms.  Previous PFTs showed no significant airflow obstruction or restriction.  Will add in additional GERD treatment and cough control regimen to help with cough. Last visit patient was recommended on test by therapy.  Will check with pharmacy team to determine status  Plan  Patient Instructions  Begin Delsym 2 tsp Twice daily  for cough As needed   Begin Tessalon Three times a day  for cough as needed  Begin Chlorpheniramine 39m At bedtime  for drainage As needed   Begin Pepcid 237mAt bedtime   Continue on Singulair and Allegra daily  Continue on Wixela and Incruse -rinse after use.  Albuterol inhaler /neb As needed   Sips of water to soothe throat and avoid coughing or throat clearing  NO Mints  Flu shot today .  Follow up with Dentist as discussed  Follow up with Dr HuSilas Floodn 6 weeks and As needed   Please contact office for sooner follow up if symptoms do not improve or worsen or seek emergency care

## 2022-10-31 NOTE — Patient Instructions (Signed)
Begin Delsym 2 tsp Twice daily  for cough As needed   Begin Tessalon Three times a day  for cough as needed  Begin Chlorpheniramine 38m At bedtime  for drainage As needed   Begin Pepcid 281mAt bedtime   Continue on Singulair and Allegra daily  Continue on Wixela and Incruse -rinse after use.  Albuterol inhaler /neb As needed   Sips of water to soothe throat and avoid coughing or throat clearing  NO Mints  Flu shot today .  Follow up with Dentist as discussed  Follow up with Dr HuSilas Floodn 6 weeks and As needed   Please contact office for sooner follow up if symptoms do not improve or worsen or seek emergency care

## 2022-10-31 NOTE — Assessment & Plan Note (Signed)
Add chlor tabs at bedtime as needed  Plan  Patient Instructions  Begin Delsym 2 tsp Twice daily  for cough As needed   Begin Tessalon Three times a day  for cough as needed  Begin Chlorpheniramine 106m At bedtime  for drainage As needed   Begin Pepcid 232mAt bedtime   Continue on Singulair and Allegra daily  Continue on Wixela and Incruse -rinse after use.  Albuterol inhaler /neb As needed   Sips of water to soothe throat and avoid coughing or throat clearing  NO Mints  Flu shot today .  Follow up with Dentist as discussed  Follow up with Dr HuSilas Floodn 6 weeks and As needed   Please contact office for sooner follow up if symptoms do not improve or worsen or seek emergency care

## 2022-11-04 ENCOUNTER — Telehealth: Payer: Self-pay

## 2022-11-04 NOTE — Telephone Encounter (Unsigned)
Paperwork has been drafted for submission for [employer]. Will need to obtain signatures prior to submission.

## 2022-11-07 NOTE — Telephone Encounter (Signed)
PFTs, most recent ED visit, and most recent OV submitted to [facility] via fax while [employer] Enrollment is submitted/pending determination   Fax: [phone] Phone: [phone] ID # [id number]   Knox Saliva, PharmD, MPH, BCPS, CPP Clinical Pharmacist (Rheumatology and Pulmonology)

## 2022-11-07 NOTE — Telephone Encounter (Signed)
PFTs, most recent ED visit, and most recent OV submitted to [facility] via fax  Fax: [phone] Phone: [phone] ID # [id number]  Knox Saliva, PharmD, MPH, BCPS, CPP Clinical Pharmacist (Rheumatology and Pulmonology)

## 2022-11-07 NOTE — Telephone Encounter (Signed)
Submitted application to [employer] for enrollment into [employer]. Will await determination.

## 2022-11-07 NOTE — Telephone Encounter (Signed)
Received fax stating that there was information missing from the submitted application, however the space that would state what information was still required was left blank. Followed up with [employer] and the rep informed me that there was not anything missing and that the pt's application had already been processed and was now undergoing manual verification.  She states that we can expect a determination in 24 to 48 hours.

## 2022-11-08 ENCOUNTER — Ambulatory Visit: Payer: Commercial Managed Care - HMO | Attending: Cardiology | Admitting: Cardiology

## 2022-11-08 ENCOUNTER — Encounter: Payer: Self-pay | Admitting: Cardiology

## 2022-11-08 VITALS — BP 120/80 | HR 62 | Ht 59.0 in | Wt 174.0 lb

## 2022-11-08 DIAGNOSIS — I5022 Chronic systolic (congestive) heart failure: Secondary | ICD-10-CM

## 2022-11-08 DIAGNOSIS — I2510 Atherosclerotic heart disease of native coronary artery without angina pectoris: Secondary | ICD-10-CM

## 2022-11-08 DIAGNOSIS — I420 Dilated cardiomyopathy: Secondary | ICD-10-CM | POA: Diagnosis not present

## 2022-11-08 MED ORDER — ENTRESTO 49-51 MG PO TABS: 1.0000 | ORAL_TABLET | Freq: Two times a day (BID) | ORAL | 3 refills | Status: DC

## 2022-11-08 NOTE — Addendum Note (Signed)
Addended by: Edwyna Shell I on: 11/08/2022 10:45 AM   Modules accepted: Orders

## 2022-11-08 NOTE — Patient Instructions (Signed)
Medication Instructions:  Your physician has recommended you make the following change in your medication:   START: Entresto 49-51 twice daily  *If you need a refill on your cardiac medications before your next appointment, please call your pharmacy*   Lab Work: Your physician recommends that you return for lab work in:   Labs in 1 week: BMP  If you have labs (blood work) drawn today and your tests are completely normal, you will receive your results only by: [place] (if you have [place]) OR A paper copy in the mail If you have any lab test that is abnormal or we need to change your treatment, we will call you to review the results.   Testing/Procedures: Your physician has requested that you have an echocardiogram. Echocardiography is a painless test that uses sound waves to create images of your heart. It provides your doctor with information about the size and shape of your heart and how well your heart's chambers and valves are working. This procedure takes approximately one hour. There are no restrictions for this procedure. Please do NOT wear cologne, perfume, aftershave, or lotions (deodorant is allowed). Please arrive 15 minutes prior to your appointment time.    Follow-Up: At [facility], you and your health needs are our priority.  As part of our continuing mission to provide you with exceptional heart care, we have created designated Provider Care Teams.  These Care Teams include your primary Cardiologist (physician) and Advanced Practice Providers (APPs -  Physician Assistants and Nurse Practitioners) who all work together to provide you with the care you need, when you need it.  We recommend signing up for the patient portal called "MyChart".  Sign up information is provided on this After Visit Summary.  MyChart is used to connect with patients for Virtual Visits (Telemedicine).  Patients are able to view lab/test results, encounter notes, upcoming  appointments, etc.  Non-urgent messages can be sent to your provider as well.   To learn more about what you can do with MyChart, go to [email or web address].    Your next appointment:   3 month(s)  Provider:   Jenne Campus, MD   Other Instructions None

## 2022-11-08 NOTE — Progress Notes (Signed)
Cardiology Office Note:    Date:  11/08/2022   ID:  Crystal Marquez, DOB 1960-09-30, MRN [id number]  PCP:  Rhea Bleacher, NP  Cardiologist:  Jenne Campus, MD    Referring MD: Rhea Bleacher, NP   Chief Complaint  Patient presents with   Follow-up    History of Present Illness:    Crystal Marquez is a 62 y.o. female past medical history significant for essential hypertension, paroxysmal atrial fibrillation, cardiomyopathy with ejection fraction moderately diminished in the neighborhood of 30%, coronary CT angio showed only 0 to 25% stenosis of LAD which obviously does not explain her cardiomyopathy.  Also bronchial asthma.  She comes to my office for follow-up, overall doing very well.  Denies have any chest pain tightness squeezing pressure burning chest.  Recently she was sick she spent some time in the emergency room look like bronchitis finally getting better with it.  No proximal deep deep nocturnal dyspnea no palpitations no dizziness  Past Medical History:  Diagnosis Date   Age-related osteoporosis without current pathological fracture    Allergic rhinitis    Anxiety    Asthma    B12 deficiency 05/06/2020   Benign essential HTN 08/29/2019   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   Chronic kidney disease    Chronic pain syndrome    Class 2 obesity due to excess calories without serious comorbidity with body mass index (BMI) of 36.0 to 36.9 in adult 08/29/2019   Colitis 05/06/2020   Colon polyp    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[place]) 05/06/2020   Coronary atherosclerosis 10/06/2021   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   Dyslipidemia 08/29/2019   Dyspnea on exertion 10/06/2021   Fibromyalgia 09/30/2021   GERD (gastroesophageal reflux disease)    Hypercalcemia    Hypertension    Iron deficiency anemia due to chronic blood loss 05/06/2020   Low back pain 11/11/2021   Major depressive disorder, single episode    Mild intermittent asthma without complication  [id number]   Myopathy    Neck pain 11/11/2021   Neuropathy    Pain in right hand 08/09/2018   Polyarthralgia 09/30/2021   Reflux esophagitis    Sjogren syndrome, unspecified ([place]) 09/30/2021   Sjogren's disease ([place])    Spinal stenosis of lumbar region 12/13/2017   Tenosynovitis of left hand 08/09/2018   Type 2 diabetes mellitus without complication, without long-term current use of insulin ([place]) 08/29/2019   Unilateral primary osteoarthritis, right knee 11/03/2021    Past Surgical History:  Procedure Laterality Date   ABDOMINAL HYSTERECTOMY     CARPAL TUNNEL RELEASE Left 2018   CERVICAL FUSION  10/07/2019   Anterior Cervical Discectomy due to spinal stenosis   CESAREAN SECTION     COLONOSCOPY     UPPER GASTROINTESTINAL ENDOSCOPY      Current Medications: Current Meds  Medication Sig   albuterol (PROVENTIL) (2.5 MG/3ML) 0.083% nebulizer solution Take 2.5 mg by nebulization every 6 (six) hours as needed for wheezing or shortness of breath.   albuterol (VENTOLIN HFA) 108 (90 Base) MCG/ACT inhaler Inhale 1 puff into the lungs every 6 (six) hours as needed for wheezing or shortness of breath.   ALPRAZolam (XANAX) 0.5 MG tablet Take 0.5 mg by mouth 3 (three) times daily as needed for anxiety.   apixaban (ELIQUIS) 5 MG TABS tablet Take 1 tablet (5 mg total) by mouth 2 (two) times daily.   benzonatate (TESSALON) 200 MG capsule Take 1 capsule (200 mg total) by mouth 3 (  three) times daily as needed.   Cholecalciferol (VITAMIN D3) 125 MCG (5000 UT) CAPS Take 5,000 Units by mouth daily.   [employer] SENNA 8.6 MG tablet TAKE 1 TABLET (8.6 MG TOTAL) BY MOUTH EVERY OTHER DAY.   DULoxetine (CYMBALTA) 60 MG capsule Take 120 mg by mouth daily.   FARXIGA 10 MG TABS tablet Take 10 mg by mouth daily.   ferrous sulfate 325 (65 FE) MG tablet Take 325 mg by mouth daily with breakfast.   fexofenadine (ALLEGRA) 180 MG tablet Take 180 mg by mouth daily.   fluticasone-salmeterol (WIXELA INHUB) 500-50 MCG/ACT AEPB  Inhale 1 puff into the lungs in the morning and at bedtime.   Folic Acid 0.8 MG CAPS Take 2 capsules by mouth daily.   hydroxychloroquine (PLAQUENIL) 200 MG tablet Take 400 mg by mouth daily.   lidocaine (LIDODERM) 5 % Place 1 patch onto the skin daily. Remove & Discard patch within 12 hours or as directed by MD   metFORMIN (GLUCOPHAGE) 500 MG tablet Take 500 mg by mouth 2 (two) times daily with a meal.   methocarbamol (ROBAXIN) 500 MG tablet TAKE 1 TABLET BY MOUTH FOUR TIMES A DAY   metoprolol succinate (TOPROL-XL) 25 MG 24 hr tablet Take 2 tablets (50 mg total) by mouth daily. Take with or immediately following a meal.   montelukast (SINGULAIR) 10 MG tablet Take 10 mg by mouth at bedtime.   NYSTATIN powder Apply 1 application  topically daily as needed (under breast/folds of skin).   pantoprazole (PROTONIX) 40 MG tablet Take 40 mg by mouth daily.   polyethylene glycol (MIRALAX / GLYCOLAX) 17 g packet Take 17 g by mouth daily as needed for mild constipation.   pregabalin (LYRICA) 150 MG capsule Take 150 mg by mouth 3 (three) times daily.   rosuvastatin (CRESTOR) 5 MG tablet Take 5 mg by mouth daily.   sacubitril-valsartan (ENTRESTO) 24-26 MG Take 1 tablet by mouth 2 (two) times daily.   spironolactone (ALDACTONE) 25 MG tablet Take 0.5 tablets (12.5 mg total) by mouth daily.   triamcinolone (KENALOG) 0.1 % paste Use as directed 1 Application in the mouth or throat 2 (two) times daily.   umeclidinium bromide (INCRUSE ELLIPTA) 62.5 MCG/ACT AEPB Inhale 1 puff into the lungs daily.     Allergies:   Lisinopril   Social History   Socioeconomic History   Marital status: Married    Spouse name: Not on file   Number of children: Not on file   Years of education: Not on file   Highest education level: Not on file  Occupational History   Not on file  Tobacco Use   Smoking status: Never   Smokeless tobacco: Never  Vaping Use   Vaping Use: Never used  Substance and Sexual Activity   Alcohol  use: No    Alcohol/week: 0.0 standard drinks of alcohol   Drug use: No   Sexual activity: Not on file  Other Topics Concern   Not on file  Social History Narrative   Not on file   Social Determinants of Health   Financial Resource Strain: Not on file  Food Insecurity: No Food Insecurity (07/20/2022)   Hunger Vital Sign    Worried About Running Out of Food in the Last Year: Never true    Ran Out of Food in the Last Year: Never true  Transportation Needs: No Transportation Needs (07/20/2022)   PRAPARE - Transportation    Lack of Transportation (Medical): No    Lack  of Transportation (Non-Medical): No  Physical Activity: Not on file  Stress: Not on file  Social Connections: Not on file     Family History: The patient's family history includes Asthma in her mother; COPD in her mother; Colon polyps in her father and sister; Crohn's disease in her sister; Depression in her father; Diabetes in her father and mother; Diverticulitis in her sister; Heart disease in her father, maternal grandmother, and paternal grandmother; Hypercholesterolemia in her sister; Hypertension in her mother; Kidney disease in her brother and mother; Skin cancer in her father. There is no history of Colon cancer, Rectal cancer, or Stomach cancer. ROS:   Please see the history of present illness.    All 14 point review of systems negative except as described per history of present illness  EKGs/Labs/Other Studies Reviewed:      Recent Labs: 02/01/2022: ALT 22 10/20/2022: B Natriuretic Peptide 104.2; BUN 16; Creatinine, Ser 0.99; Hemoglobin 14.6; Platelets 163; Potassium 4.8; Sodium 142  Recent Lipid Panel    Component Value Date/Time   CHOL 122 07/20/2022 0459   TRIG 70 07/20/2022 0459   HDL 61 07/20/2022 0459   CHOLHDL 2.0 07/20/2022 0459   VLDL 14 07/20/2022 0459   LDLCALC 47 07/20/2022 0459    Physical Exam:    VS:  BP 120/80 (BP Location: Left Arm, Patient Position: Sitting, Cuff Size: Normal)    Pulse 62   Ht 4' 11"$  (1.499 m)   Wt 174 lb (78.9 kg)   LMP  (LMP Unknown)   SpO2 96%   BMI 35.14 kg/m     Wt Readings from Last 3 Encounters:  11/08/22 174 lb (78.9 kg)  10/31/22 167 lb (75.8 kg)  09/28/22 172 lb 6.4 oz (78.2 kg)     GEN:  Well nourished, well developed in no acute distress HEENT: Normal NECK: No JVD; No carotid bruits LYMPHATICS: No lymphadenopathy CARDIAC: RRR, no murmurs, no rubs, no gallops RESPIRATORY:  Clear to auscultation without rales, wheezing or rhonchi  ABDOMEN: Soft, non-tender, non-distended MUSCULOSKELETAL:  No edema; No deformity  SKIN: Warm and dry LOWER EXTREMITIES: no swelling NEUROLOGIC:  Alert and oriented x 3 PSYCHIATRIC:  Normal affect   ASSESSMENT:    1. Dilated cardiomyopathy (HCC) ejection fraction percent based on done in November 2 023   2. Coronary artery disease involving native coronary artery of native heart without angina pectoris   3. Chronic systolic congestive heart failure (HCC)    PLAN:    In order of problems listed above:  Cardiomyopathy which is dilated nonischemic, will double the dose of Entresto will check Chem-7 next week will schedule her to have echocardiogram.  If ejection fraction does not improve then we will consider MRI of the heart. Coronary artery disease only mild stenosis in the LAD 0 to 24%.  Risk factors modifications. Essential hypertension blood pressure well-controlled continue present management. Dyslipidemia she is on Crestor 5 which I will continue LDL 47 HDL 61 continue present management   Medication Adjustments/Labs and Tests Ordered: Current medicines are reviewed at length with the patient today.  Concerns regarding medicines are outlined above.  No orders of the defined types were placed in this encounter.  Medication changes: No orders of the defined types were placed in this encounter.   Signed, Park Liter, MD, [facility] 11/08/2022 10:24 AM    [place]

## 2022-11-09 NOTE — Telephone Encounter (Signed)
ATC patient to schedule Tezspire new start. Unable to reach. Left VM requesting return call. Will f/u  Knox Saliva, PharmD, MPH, BCPS, CPP Clinical Pharmacist (Rheumatology and Pulmonology)

## 2022-11-09 NOTE — Telephone Encounter (Signed)
Received notification from [employer] stating that pt has been approved for the [employer] and that they will be reaching out to pt to schedule first shipment in the next 1-2 days. Approval letter sent to scan center.  Will route to [facility] for clinical f/u and New Start scheduling.

## 2022-11-10 ENCOUNTER — Telehealth: Payer: Self-pay | Admitting: Pulmonary Disease

## 2022-11-10 NOTE — Telephone Encounter (Signed)
She states that on this PT Enrolment Form, page 3, providers consent to fast start is not selected. Please select and resend via fax.   Crystal Marquez Fax # [phone]

## 2022-11-10 NOTE — Telephone Encounter (Signed)
Completed and refaxed  Knox Saliva, PharmD, MPH, BCPS, CPP Clinical Pharmacist (Rheumatology and Pulmonology)

## 2022-11-15 ENCOUNTER — Encounter: Payer: Self-pay | Admitting: Pulmonary Disease

## 2022-11-16 NOTE — Telephone Encounter (Signed)
Spoke w patient. She is expecting shipment of [employer]. She has been advised to leave in fridge. Will use sample for new start visit that is tomorrow, 11/17/2022.  Knox Saliva, PharmD, MPH, BCPS, CPP Clinical Pharmacist (Rheumatology and Pulmonology)

## 2022-11-17 ENCOUNTER — Ambulatory Visit: Payer: Commercial Managed Care - HMO | Admitting: Pharmacist

## 2022-11-17 DIAGNOSIS — J4550 Severe persistent asthma, uncomplicated: Secondary | ICD-10-CM

## 2022-11-17 DIAGNOSIS — Z7189 Other specified counseling: Secondary | ICD-10-CM

## 2022-11-17 LAB — BASIC METABOLIC PANEL
BUN/Creatinine Ratio: 9 (ref 9–23)
BUN: 9 mg/dL (ref 6–24)
CO2: 23 mmol/L (ref 20–29)
Calcium: 9.4 mg/dL (ref 8.7–10.2)
Chloride: 103 mmol/L (ref 96–106)
Creatinine, Ser: 0.98 mg/dL (ref 0.57–1.00)
Glucose: 119 mg/dL — ABNORMAL HIGH (ref 70–99)
Potassium: 4.6 mmol/L (ref 3.5–5.2)
Sodium: 144 mmol/L (ref 134–144)
eGFR: 66 mL/min/{1.73_m2} (ref >59)

## 2022-11-17 MED ORDER — TEZSPIRE 210 MG/1.91ML ~~LOC~~ SOAJ: 210.0000 mg | SUBCUTANEOUS | 5 refills | Status: DC

## 2022-11-17 NOTE — Progress Notes (Signed)
HPI Patient presents today to [facility] to see pharmacy team for [facility] new start.    She had ED visit on 10/19/22 for asthma flare. Her insurance denied Tezspire due to not meeting PFT requirements. She has been enrolled into the bridge program  Respiratory Medications Current regimen: Wixela 500-50 1 puff twice daily, Incruse 1 puff once daily, montelukast '10mg'$  nightly Patient reports no known adherence challenges. Inhalers are affordable  OBJECTIVE Allergies  Allergen Reactions   Lisinopril Shortness Of Breath    Bloating     Outpatient Encounter Medications as of 11/17/2022  Medication Sig   albuterol (PROVENTIL) (2.5 MG/3ML) 0.083% nebulizer solution Take 2.5 mg by nebulization every 6 (six) hours as needed for wheezing or shortness of breath.   albuterol (VENTOLIN HFA) 108 (90 Base) MCG/ACT inhaler Inhale 1 puff into the lungs every 6 (six) hours as needed for wheezing or shortness of breath.   ALPRAZolam (XANAX) 0.5 MG tablet Take 0.5 mg by mouth 3 (three) times daily as needed for anxiety.   apixaban (ELIQUIS) 5 MG TABS tablet Take 1 tablet (5 mg total) by mouth 2 (two) times daily.   benzonatate (TESSALON) 200 MG capsule Take 1 capsule (200 mg total) by mouth 3 (three) times daily as needed.   Cholecalciferol (VITAMIN D3) 125 MCG (5000 UT) CAPS Take 5,000 Units by mouth daily.   [employer] SENNA 8.6 MG tablet TAKE 1 TABLET (8.6 MG TOTAL) BY MOUTH EVERY OTHER DAY.   DULoxetine (CYMBALTA) 60 MG capsule Take 120 mg by mouth daily.   FARXIGA 10 MG TABS tablet Take 10 mg by mouth daily.   ferrous sulfate 325 (65 FE) MG tablet Take 325 mg by mouth daily with breakfast.   fexofenadine (ALLEGRA) 180 MG tablet Take 180 mg by mouth daily.   fluticasone-salmeterol (WIXELA INHUB) 500-50 MCG/ACT AEPB Inhale 1 puff into the lungs in the morning and at bedtime.   Folic Acid 0.8 MG CAPS Take 2 capsules by mouth daily.   hydroxychloroquine (PLAQUENIL) 200 MG tablet Take 400 mg by mouth  daily.   lidocaine (LIDODERM) 5 % Place 1 patch onto the skin daily. Remove & Discard patch within 12 hours or as directed by MD   metFORMIN (GLUCOPHAGE) 500 MG tablet Take 500 mg by mouth 2 (two) times daily with a meal.   methocarbamol (ROBAXIN) 500 MG tablet TAKE 1 TABLET BY MOUTH FOUR TIMES A DAY   metoprolol succinate (TOPROL-XL) 25 MG 24 hr tablet Take 2 tablets (50 mg total) by mouth daily. Take with or immediately following a meal.   montelukast (SINGULAIR) 10 MG tablet Take 10 mg by mouth at bedtime.   NYSTATIN powder Apply 1 application  topically daily as needed (under breast/folds of skin).   pantoprazole (PROTONIX) 40 MG tablet Take 40 mg by mouth daily.   polyethylene glycol (MIRALAX / GLYCOLAX) 17 g packet Take 17 g by mouth daily as needed for mild constipation.   pregabalin (LYRICA) 150 MG capsule Take 150 mg by mouth 3 (three) times daily.   rosuvastatin (CRESTOR) 5 MG tablet Take 5 mg by mouth daily.   sacubitril-valsartan (ENTRESTO) 49-51 MG Take 1 tablet by mouth 2 (two) times daily.   spironolactone (ALDACTONE) 25 MG tablet Take 0.5 tablets (12.5 mg total) by mouth daily.   triamcinolone (KENALOG) 0.1 % paste Use as directed 1 Application in the mouth or throat 2 (two) times daily.   umeclidinium bromide (INCRUSE ELLIPTA) 62.5 MCG/ACT AEPB Inhale 1 puff into the  lungs daily.   No facility-administered encounter medications on file as of 11/17/2022.     Immunization History  Administered Date(s) Administered   Influenza,inj,Quad PF,6+ Mos 10/31/2022   Influenza,inj,quad, With Preservative 06/19/2017   Influenza-Unspecified 06/28/2021   PFIZER(Purple Top)SARS-COV-2 Vaccination 12/19/2019, 01/13/2020     PFTs    Latest Ref Rng & Units 08/15/2022    3:48 PM  PFT Results  FVC-Pre L 2.18   FVC-Predicted Pre % 79   FVC-Post L 2.30   FVC-Predicted Post % 83   Pre FEV1/FVC % % 85   Post FEV1/FCV % % 88   FEV1-Pre L 1.86   FEV1-Predicted Pre % 87   FEV1-Post L 2.02    DLCO uncorrected ml/min/mmHg 19.17   DLCO UNC% % 112   DLCO corrected ml/min/mmHg 18.06   DLCO COR %Predicted % 106   DLVA Predicted % 121   TLC L 3.98   TLC % Predicted % 92   RV % Predicted % 43      Eosinophils Most recent blood eosinophil count was 200 cells/microL taken on 10/20/22.   IgE: 18 on 03/25/2022  Assessment   Biologics training for tezepulumab Cheron Every)  Goals of therapy: Mechanism: human monoclonal IgG2? antibody that binds to TSLP. This blocks TSLP from its effect on inflammation including reduce eosinophils, IgE, FeNO, IL-5, and IL-13. Mechanism is not definitively established. Reviewed that Cheron Every is add-on medication and patient must continue maintenance inhaler regimen. Response to therapy: may take 3-4 months to determine efficacy.  Side effects: injection site reaction (6-18%), antibody development (2%), arthralgia (4%), back pain (4%), pharyngitis (4%)  Dose: Tezspire 210 mg once every 4 weeks  Administration/Storage:  Reviewed administration sites of thigh or abdomen (at least 2-3 inches away from abdomen). Reviewed the upper arm is only appropriate if caregiver is administering injection  Do not shake pen/syringe as this could lead to product foaming or precipitation. Do not shake syringe as this could lead to product foaming or precipitation.  Access: Approval of Tezspire through: manufacturer bridge program Denied through insurance and appeal is pending  Patient self-administered Tezspire '210mg'$ /1.91 ml in left upper thigh using sample Tezspire '210mg'$ /1.91 ml Autoinjector pen NDC: [phone] Lot: [id number] Expiration: 08/18/2024  Patient monitored for 30 minutes for adverse reaction.  Patient tolerated well - some pain but she did well with injection. Injection site checked and no redness or swelling notes. Patient denies itchiness and irritation  Medication Reconciliation  A drug regimen assessment was performed, including review of  allergies, interactions, disease-state management, dosing and immunization history. Medications were reviewed with the patient, including name, instructions, indication, goals of therapy, potential side effects, importance of adherence, and safe use.  Drug interaction(s): none noted  PLAN Continue Tezspire '210mg'$  SQ every 28 days.  Rx sent to:  [employer] (with bridge program) .   Appeal through insurance still in process Continue maintenance asthma regimen of: Wixela 500-50 1 puff twice daily, Incruse 1 puff once daily, montelukast '10mg'$  nightly  All questions encouraged and answered.  Instructed patient to reach out with any further questions or concerns.  Thank you for allowing pharmacy to participate in this patient's care.  This appointment required 45 minutes of patient care (this includes precharting, chart review, review of results, face-to-face care, etc.).   Knox Saliva, PharmD, MPH, BCPS, CPP Clinical Pharmacist (Rheumatology and Pulmonology)

## 2022-11-17 NOTE — Patient Instructions (Signed)
Your next TEZSPIRE dose is due on 12/15/22, 01/12/23, and every 4 weeks thereafter  CONTINUE Wixela 500-50 1 puff twice daily, Incruse 1 puff once daily, montelukast '10mg'$  nightly  Please call the pharmacy month to month to schedule refills  You will need to be seen by your provider in 3 to 4 months to assess how TEZSPIRE is working for you. Please ensure you have a follow-up appointment scheduled by June 2024. Call our clinic if you need to make this appointment.  How to manage an injection site reaction: Remember the 5 C's: COUNTER - leave on the counter at least 30 minutes but up to overnight to bring medication to room temperature. This may help prevent stinging COLD - place something cold (like an ice gel pack or cold water bottle) on the injection site just before cleansing with alcohol. This may help reduce pain CLARITIN - use Claritin (generic name is loratadine) for the first two weeks of treatment or the day of, the day before, and the day after injecting. This will help to minimize injection site reactions CORTISONE CREAM - apply if injection site is irritated and itching CALL ME - if injection site reaction is bigger than the size of your fist, looks infected, blisters, or if you develop hives

## 2022-11-17 NOTE — Telephone Encounter (Signed)
Patient is scheduled for Tezspire new start appt today 11/17/2022. Nothing further needed  Knox Saliva, PharmD, MPH, BCPS, CPP Clinical Pharmacist (Rheumatology and Pulmonology)

## 2022-11-24 ENCOUNTER — Encounter: Payer: Self-pay | Admitting: Radiology

## 2022-11-24 ENCOUNTER — Other Ambulatory Visit: Payer: Self-pay | Admitting: Orthopaedic Surgery

## 2022-11-29 DIAGNOSIS — R946 Abnormal results of thyroid function studies: Secondary | ICD-10-CM | POA: Insufficient documentation

## 2022-12-07 ENCOUNTER — Ambulatory Visit: Payer: Commercial Managed Care - HMO | Attending: Cardiology

## 2022-12-07 DIAGNOSIS — I5030 Unspecified diastolic (congestive) heart failure: Secondary | ICD-10-CM

## 2022-12-07 DIAGNOSIS — I420 Dilated cardiomyopathy: Secondary | ICD-10-CM | POA: Diagnosis not present

## 2022-12-07 DIAGNOSIS — I083 Combined rheumatic disorders of mitral, aortic and tricuspid valves: Secondary | ICD-10-CM

## 2022-12-07 DIAGNOSIS — I5022 Chronic systolic (congestive) heart failure: Secondary | ICD-10-CM | POA: Diagnosis not present

## 2022-12-07 DIAGNOSIS — I2510 Atherosclerotic heart disease of native coronary artery without angina pectoris: Secondary | ICD-10-CM

## 2022-12-07 LAB — ECHOCARDIOGRAM COMPLETE
Calc EF: 44.2 %
P 1/2 time: 775 msec
S' Lateral: 3.7 cm
Single Plane A2C EF: 42.7 %
Single Plane A4C EF: 45.4 %

## 2022-12-07 MED ORDER — PERFLUTREN LIPID MICROSPHERE
1.0000 mL | INTRAVENOUS | Status: AC | PRN
  Administered 2022-12-07: 8 mL via INTRAVENOUS

## 2022-12-14 ENCOUNTER — Telehealth: Payer: Self-pay

## 2022-12-14 NOTE — Telephone Encounter (Signed)
-----   Message from Park Liter, MD sent at 12/14/2022  9:13 AM EDT ----- Echocardiogram showed improvement left ventricle ejection fraction from 30% mild to 4550.  Mild mitral valve regurgitation some stiffening of the heart but otherwise looks good, continue present management

## 2022-12-14 NOTE — Telephone Encounter (Signed)
Patient notified of results.

## 2022-12-26 ENCOUNTER — Encounter: Payer: Self-pay | Admitting: Pulmonary Disease

## 2022-12-26 ENCOUNTER — Ambulatory Visit: Payer: Commercial Managed Care - HMO | Admitting: Pulmonary Disease

## 2022-12-26 VITALS — BP 128/70 | HR 54 | Wt 181.0 lb

## 2022-12-26 DIAGNOSIS — R5383 Other fatigue: Secondary | ICD-10-CM

## 2022-12-26 DIAGNOSIS — J4550 Severe persistent asthma, uncomplicated: Secondary | ICD-10-CM | POA: Diagnosis not present

## 2022-12-26 LAB — VITAMIN D 25 HYDROXY (VIT D DEFICIENCY, FRACTURES): VITD: 39.55 ng/mL (ref 30.00–100.00)

## 2022-12-26 MED ORDER — PREDNISONE 20 MG PO TABS: ORAL_TABLET | ORAL | 0 refills | Status: AC

## 2022-12-26 NOTE — Patient Instructions (Signed)
Nice to see you again  I sent prednisone with instructions on how to take to your local pharmacy if things are worsening in the coming days to weeks  I am hopeful to test prior will start kicking in and help, it is possible the pollen seasons, be bad either way maybe this medication will help you through the other times of the year will be more beneficial in the fall since has another time when asthma symptoms are worse.  For fatigue, will check a vitamin D level today.  No changes to inhalers  Return to clinic in 3 months or sooner as needed with Dr. Judeth Horn

## 2022-12-26 NOTE — Progress Notes (Signed)
@Patient  ID: Crystal Marquez, female    DOB: 1960-11-18, 62 y.o.   MRN: [id number]  Chief Complaint  Patient presents with   Follow-up    Pt is here for asthma follow up. Pt is on wixella, singular and albuterol. Pt states the weather has made her asthma really bad. Pt is also on Tezspire injections. ACT is 13, was 12 last visit.     Referring provider: Erskine Emery, NP  HPI:   62 y.o. woman whom we are seeing in follow up for dyspnea, wheezing felt to be related to asthma.  Most recent cardiology note reviewed.  Most recent pulmonary note Tammy.  NP reviewed.  Returns for scheduled follow-up.  Poorly controlled asthma despite triple inhaled therapy via Wixela and Incruse.  Just started testifying last few weeks.  Has done 2 injections.  Still with relatively poor control symptoms.  Of course the pollen is really bad right now which is likely the main problem.  Also her husband is resume remodeling the house, sheet rock dust etc.  This change in environment also likely worsening things.  Discussed expectant management, hopeful that with additional time on test by her symptoms will start to improve.  Relatively early, only a few weeks again.  Also, hopefully with changing environment, sheet rock dust decreasing over time, pollen season ending in the next few weeks, symptoms will improve.  Discussed prednisone today as well just to have on hand in case symptoms worsen in the meantime.  She is fatigued.  Takes vitamin D but only supplemental, not high-dose.  No vitamin D level seen.  Will check that today as well.  Reviewed her TTE that demonstrated mild improvement in reduced EF but now diastolic dysfunction.  Likely cardiac contributors to her shortness of breath and fatigue as well.   HPI at initial visit: Patient in normal state of health.  Got COVID October 2022.  Since then she is had intermittent issues with shortness of breath, wheezing.  Occasional cough.  Sometimes worse in the evenings.   Worse with exertion although can occur at rest as well.  The feeling of inability get deep breath, mild chest tightness.  Seems to come and go.  Some days are better than others.  No other seasonal environmental factors he can identify to make things better or worse.  Mild relief with prednisone, albuterol.  However after prednisone symptoms returned.  She reports history of recurrent bronchitis usually at least once a year sometimes twice a year with season changes.  Reviewed CT chest 08/23/2021 reveals clear lungs bilaterally on my review interpretation.  Reviewed echocardiogram 09/02/2021 that shows reduction in EF to 40% as well as grade 2 diastolic dysfunction and dilated left atrium, moderate MVR, normal RA size and normal RV function and size.   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   Questionaires / Pulmonary Flowsheets:   ACT:  Asthma Control Test ACT Total Score  12/26/2022  9:39 AM 13  10/31/2022  9:01 AM 6  09/28/2022  9:44 AM 12    MMRC:     No data to display           Epworth:      No data to display  Tests:   FENO:  No results found for: "NITRICOXIDE"  PFT:    Latest Ref Rng & Units 08/15/2022    3:48 PM  PFT Results  FVC-Pre L 2.18   FVC-Predicted Pre % 79   FVC-Post L 2.30   FVC-Predicted Post % 83   Pre FEV1/FVC % % 85   Post FEV1/FCV % % 88   FEV1-Pre L 1.86   FEV1-Predicted Pre % 87   FEV1-Post L 2.02   DLCO uncorrected ml/min/mmHg 19.17   DLCO UNC% % 112   DLCO corrected ml/min/mmHg 18.06   DLCO COR %Predicted % 106   DLVA Predicted % 121   TLC L 3.98   TLC % Predicted % 92   RV % Predicted % 43    Personally reviewed interpreted as normal spirometry, no bronchodilator spots, lung wise within normal notes, DLCO within normal limits.  WALK:      No data to display           Imaging: Personally reviewed and as per EMR discussion this note ECHOCARDIOGRAM COMPLETE  Result Date: 12/07/2022    ECHOCARDIOGRAM REPORT   Patient Name:   CORRIE Marquez Date of Exam: 12/07/2022 Medical Rec #:  [id number]   Height:       59.0 in Accession #:    [id number]  Weight:       174.0 lb Date of Birth:  07-May-1961    BSA:          1.738 m Patient Age:    62 years    BP:           120/80 mmHg Patient Gender: F           HR:           57 bpm. Exam Location:  [facility] Procedure: 2D Echo, Cardiac Doppler, Color Doppler, Strain Analysis and            Intracardiac Opacification Agent Indications:    Dilated cardiomyopathy (HCC) ejection fraction percent based on                 done in November 2 023 [I42.0 (ICD-10-CM)]; Coronary artery                 disease involving native coronary artery of native heart without                 angina pectoris [I25.10 (ICD-10-CM)]; Chronic systolic                 congestive heart failure (HCC) [I50.22 (ICD-10-CM)]  History:        Patient has prior history of Echocardiogram examinations, most                 recent 07/20/2022. CHF and Cardiomyopathy, CAD,                 Signs/Symptoms:Chest Pain; Risk Factors:Hypertension.  Sonographer:    Louie Boston RDCS Referring Phys: [phone] Georgeanna Lea  Sonographer Comments: Suboptimal subcostal window and suboptimal apical window. IMPRESSIONS  1. GLS -11.7. Left ventricular ejection fraction, by estimation, is 45 to 50%. The left ventricle has mildly decreased function. The left ventricle has no regional wall motion abnormalities. Left ventricular diastolic parameters are consistent with Grade II diastolic dysfunction (pseudonormalization).  2. Right ventricular systolic function is normal. The right ventricular size is normal.  3. The mitral  valve is normal in structure. Mild mitral valve regurgitation. No evidence of mitral stenosis.  4. The aortic valve is normal in structure. Aortic valve regurgitation is mild. No aortic stenosis is present.  5. The inferior vena cava is normal in size with greater than 50% respiratory variability, suggesting right atrial pressure of 3 mmHg. FINDINGS  Left Ventricle: GLS -11.7. Left ventricular ejection fraction, by estimation, is 45 to 50%. The left ventricle has mildly decreased function. The left ventricle has no regional wall motion abnormalities. Definity contrast agent was given IV to delineate  the left ventricular endocardial borders. The left ventricular internal cavity size was normal in size. There is no left ventricular hypertrophy. Left ventricular diastolic parameters are consistent with Grade II diastolic dysfunction (pseudonormalization). Right Ventricle: The right ventricular size is normal. No increase in right ventricular wall thickness. Right ventricular systolic function is normal. Left Atrium: Left atrial size was normal in size. Right Atrium: Right atrial size was normal in size. Pericardium: There is no evidence of pericardial effusion. Mitral Valve: The mitral valve is normal in structure. Mild mitral valve regurgitation. No evidence of mitral valve stenosis. Tricuspid Valve: The tricuspid valve is normal in structure. Tricuspid valve regurgitation is mild . No evidence of tricuspid stenosis. Aortic Valve: The aortic valve is normal in structure. Aortic valve regurgitation is mild. Aortic regurgitation PHT measures 775 msec. No aortic stenosis is present. Pulmonic Valve: The pulmonic valve was normal in structure. Pulmonic valve regurgitation is not visualized. No evidence of pulmonic stenosis. Aorta: The aortic root is normal in size and structure. Venous: The inferior vena cava is normal in size with greater than 50% respiratory variability, suggesting right atrial pressure of 3 mmHg.  IAS/Shunts: No atrial level shunt detected by color flow Doppler.  LEFT VENTRICLE PLAX 2D LVIDd:         4.80 cm     Diastology LVIDs:         3.70 cm     LV e' medial:    5.33 cm/s LV PW:         1.00 cm     LV E/e' medial:  18.6 LV IVS:        1.10 cm     LV e' lateral:   5.77 cm/s LVOT diam:     2.00 cm     LV E/e' lateral: 17.2 LV SV:         52 LV SV Index:   30 LVOT Area:     3.14 cm  LV Volumes (MOD) LV vol d, MOD A2C: 64.9 ml LV vol d, MOD A4C: 63.0 ml LV vol s, MOD A2C: 37.2 ml LV vol s, MOD A4C: 34.4 ml LV SV MOD A2C:     27.7 ml LV SV MOD A4C:     63.0 ml LV SV MOD BP:      28.3 ml RIGHT VENTRICLE RV S prime:     11.00 cm/s TAPSE (M-mode): 2.2 cm LEFT ATRIUM             Index        RIGHT ATRIUM           Index LA diam:        3.80 cm 2.19 cm/m   RA Area:     10.20 cm LA Vol (A2C):   45.2 ml 26.00 ml/m  RA Volume:   20.30 ml  11.68 ml/m LA Vol (A4C):   31.2 ml 17.95 ml/m LA Biplane Vol: 37.6 ml 21.63 ml/m  AORTIC VALVE LVOT Vmax:   75.20 cm/s LVOT Vmean:  46.650 cm/s LVOT VTI:    0.167 m AI PHT:      775 msec  AORTA Ao Root diam: 2.50 cm Ao Asc diam:  2.90 cm MV E velocity: 99.00 cm/s  TRICUSPID VALVE MV A velocity: 99.00 cm/s  TR Peak grad:   19.4 mmHg MV E/A ratio:  1.00        TR Vmax:        220.00 cm/s                             SHUNTS                            Systemic VTI:  0.17 m                            Systemic Diam: 2.00 cm Gypsy Balsam MD Electronically signed by Gypsy Balsam MD Signature Date/Time: 12/07/2022/12:58:22 PM    Final     Lab Results: Personally reviewed via [facility] with mild elevation in eosinophils 200 and 02/2020 CBC    Component Value Date/Time   WBC 12.2 (H) 10/20/2022 0015   RBC 4.93 10/20/2022 0015   HGB 14.6 10/20/2022 0015   HGB 15.6 07/28/2022 1603   HCT 45.5 10/20/2022 0015   HCT 46.6 07/28/2022 1603   PLT 163 10/20/2022 0015   PLT 178 07/28/2022 1603   MCV 92.3 10/20/2022 0015   MCV 88 07/28/2022 1603   MCH 29.6  10/20/2022 0015   MCHC 32.1 10/20/2022 0015   RDW 13.9 10/20/2022 0015   RDW 14.1 07/28/2022 1603   LYMPHSABS 2.7 10/20/2022 0015   LYMPHSABS 1.6 07/28/2022 1603   MONOABS 0.9 10/20/2022 0015   EOSABS 0.2 10/20/2022 0015   EOSABS 0.4 07/28/2022 1603   BASOSABS 0.0 10/20/2022 0015   BASOSABS 0.0 07/28/2022 1603    BMET    Component Value Date/Time   NA 144 11/16/2022 1432   K 4.6 11/16/2022 1432   CL 103 11/16/2022 1432   CO2 23 11/16/2022 1432   GLUCOSE 119 (H) 11/16/2022 1432   GLUCOSE 134 (H) 10/20/2022 0015   BUN 9 11/16/2022 1432   CREATININE 0.98 11/16/2022 1432   CALCIUM 9.4 11/16/2022 1432   GFRNONAA >60 10/20/2022 0015    BNP    Component Value Date/Time   BNP 104.2 (H) 10/20/2022 0015    ProBNP No results found for: "PROBNP"  Specialty Problems       Pulmonary Problems   Mild intermittent asthma without complication   Allergic rhinitis   Asthma   Dyspnea on exertion    Allergies  Allergen Reactions   Lisinopril Shortness Of Breath    Bloating     Immunization History  Administered Date(s) Administered   Influenza,inj,Quad PF,6+ Mos 10/31/2022   Influenza,inj,quad, With Preservative 06/19/2017   Influenza-Unspecified 06/28/2021   PFIZER(Purple Top)SARS-COV-2 Vaccination 12/19/2019, 01/13/2020    Past Medical History:  Diagnosis Date   Age-related osteoporosis without current pathological fracture    Allergic rhinitis    Anxiety    Asthma    B12 deficiency 05/06/2020   Benign essential HTN 08/29/2019   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   Chronic kidney disease    Chronic pain syndrome    Class 2 obesity due to excess calories without serious comorbidity with body mass index (BMI) of 36.0  to 36.9 in adult 08/29/2019   Colitis 05/06/2020   Colon polyp    Colon stricture 05/06/2020   Coronary atherosclerosis 10/06/2021   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   Dyslipidemia 08/29/2019   Dyspnea on exertion  10/06/2021   Fibromyalgia 09/30/2021   GERD (gastroesophageal reflux disease)    Hypercalcemia    Hypertension    Iron deficiency anemia due to chronic blood loss 05/06/2020   Low back pain 11/11/2021   Major depressive disorder, single episode    Mild intermittent asthma without complication 07/29/2020   Myopathy    Neck pain 11/11/2021   Neuropathy    Pain in right hand 08/09/2018   Polyarthralgia 09/30/2021   Reflux esophagitis    Sjogren syndrome, unspecified 09/30/2021   Sjogren's disease    Spinal stenosis of lumbar region 12/13/2017   Tenosynovitis of left hand 08/09/2018   Type 2 diabetes mellitus without complication, without long-term current use of insulin 08/29/2019   Unilateral primary osteoarthritis, right knee 11/03/2021    Tobacco History: Social History   Tobacco Use  Smoking Status Never  Smokeless Tobacco Never   Counseling given: Not Answered   Continue to not smoke  Outpatient Encounter Medications as of 12/26/2022  Medication Sig   albuterol (PROVENTIL) (2.5 MG/3ML) 0.083% nebulizer solution Take 2.5 mg by nebulization every 6 (six) hours as needed for wheezing or shortness of breath.   albuterol (VENTOLIN HFA) 108 (90 Base) MCG/ACT inhaler Inhale 1 puff into the lungs every 6 (six) hours as needed for wheezing or shortness of breath.   ALPRAZolam (XANAX) 0.5 MG tablet Take 0.5 mg by mouth 3 (three) times daily as needed for anxiety.   apixaban (ELIQUIS) 5 MG TABS tablet Take 1 tablet (5 mg total) by mouth 2 (two) times daily.   benzonatate (TESSALON) 200 MG capsule Take 1 capsule (200 mg total) by mouth 3 (three) times daily as needed.   Cholecalciferol (VITAMIN D3) 125 MCG (5000 UT) CAPS Take 5,000 Units by mouth daily.   DULoxetine (CYMBALTA) 60 MG capsule Take 120 mg by mouth daily.   FARXIGA 10 MG TABS tablet Take 10 mg by mouth daily.   ferrous sulfate 325 (65 FE) MG tablet Take 325 mg by mouth daily with breakfast.   fexofenadine (ALLEGRA) 180 MG  tablet Take 180 mg by mouth daily.   fluticasone-salmeterol (WIXELA INHUB) 500-50 MCG/ACT AEPB Inhale 1 puff into the lungs in the morning and at bedtime.   Folic Acid 0.8 MG CAPS Take 2 capsules by mouth daily.   hydroxychloroquine (PLAQUENIL) 200 MG tablet Take 400 mg by mouth daily.   lidocaine (LIDODERM) 5 % Place 1 patch onto the skin daily. Remove & Discard patch within 12 hours or as directed by MD   metFORMIN (GLUCOPHAGE) 500 MG tablet Take 500 mg by mouth 2 (two) times daily with a meal.   methocarbamol (ROBAXIN) 500 MG tablet TAKE 1 TABLET BY MOUTH FOUR TIMES A DAY   metoprolol succinate (TOPROL-XL) 25 MG 24 hr tablet Take 2 tablets (50 mg total) by mouth daily. Take with or immediately following a meal.   montelukast (SINGULAIR) 10 MG tablet Take 10 mg by mouth at bedtime.   NYSTATIN powder Apply 1 application  topically daily as needed (under breast/folds of skin).   pantoprazole (PROTONIX) 40 MG tablet Take 40 mg by mouth daily.   polyethylene glycol (MIRALAX / GLYCOLAX) 17 g packet Take 17 g by mouth daily as needed for mild constipation.  predniSONE (DELTASONE) 20 MG tablet Take 2 tablets (40 mg total) by mouth daily with breakfast for 5 days, THEN 1 tablet (20 mg total) daily with breakfast for 5 days.   pregabalin (LYRICA) 150 MG capsule Take 150 mg by mouth 3 (three) times daily.   rosuvastatin (CRESTOR) 5 MG tablet Take 5 mg by mouth daily.   sacubitril-valsartan (ENTRESTO) 49-51 MG Take 1 tablet by mouth 2 (two) times daily.   spironolactone (ALDACTONE) 25 MG tablet Take 0.5 tablets (12.5 mg total) by mouth daily.   Tezepelumab-ekko (TEZSPIRE) 210 MG/1.91ML SOAJ Inject 210 mg into the skin every 28 (twenty-eight) days.   triamcinolone (KENALOG) 0.1 % paste Use as directed 1 Application in the mouth or throat 2 (two) times daily.   umeclidinium bromide (INCRUSE ELLIPTA) 62.5 MCG/ACT AEPB Inhale 1 puff into the lungs daily.   No facility-administered encounter medications on  file as of 12/26/2022.     Review of Systems  Review of Systems  N/a Physical Exam  BP 128/70 (BP Location: Left Arm, Patient Position: Sitting, Cuff Size: Normal)   Pulse (!) 54   Wt 181 lb (82.1 kg)   LMP  (LMP Unknown)   SpO2 97%   BMI 36.56 kg/m   Wt Readings from Last 5 Encounters:  12/26/22 181 lb (82.1 kg)  11/08/22 174 lb (78.9 kg)  10/31/22 167 lb (75.8 kg)  09/28/22 172 lb 6.4 oz (78.2 kg)  09/16/22 171 lb (77.6 kg)    BMI Readings from Last 5 Encounters:  12/26/22 36.56 kg/m  11/08/22 35.14 kg/m  10/31/22 33.73 kg/m  09/28/22 34.82 kg/m  09/16/22 34.54 kg/m     Physical Exam General: Well-appearing, sitting in chair Eyes: EOMI, icterus Neck: Supple, no JVP Pulmonary: Clear, no wheeze Cardiovascular: Regular in rhythm, no murmur Abdomen: Nondistended, bowel sounds present MSK: No synovitis, no joint effusion Neuro: Normal gait, no weakness Psych: Normal mood, full affect   Assessment & Plan:   Dyspnea on exertion: Likely multifactorial.  Contribution of cardiac causes given her reduced EF to 45% as well as left atrial dilation and MVR and diastolic dysfunction most recently on TTE.  Fortunately right side looks reassuring in terms of pulmonary hypertension.  Additionally, high suspicion for poorly controlled asthma especially with exacerbation or worsening since COVID infection 06/2021.  Mild improvement inhalers.  See below for asthma.  Overall, likely multifactorial and difficult control cardiac and pulmonary disease.  Severe persistent asthma with exacerbation: Previous diagnosis.  Suspicious for uncontrolled asthma given worsening in symptoms after COVID infection, post viral.  Disease control was adequate on Trelegy high-dose.  However, no longer covered by insurance.  On Incruse and Wixela.  Still poorly controlled symptoms.  Test prior to/2024.  2 doses in.  Mild improvement in symptoms.  Hopefully will continue to improve.  Worsening symptoms in  the setting of pollen season as well as remodeling the house.  Environmental factors contribute to worsening disease.  Consider chronic prednisone in the future if not adequately controlled with Biologics.  Fatigue: Likely multifactorial related to pulmonary and cardiac disease.  Vitamin D level will be checked today for further evaluation.  Return in about 3 months (around 03/27/2023).   Karren BurlyMatthew R Terelle Dobler, MD 12/26/2022  I spent 41 minutes in the care of the patient including face-to-face visit, coordination of care, review of records.

## 2022-12-27 NOTE — Progress Notes (Signed)
VIT D level is ok - continue taking Vit D supplement

## 2023-01-06 ENCOUNTER — Other Ambulatory Visit: Payer: Self-pay | Admitting: Physician Assistant

## 2023-01-07 ENCOUNTER — Other Ambulatory Visit: Payer: Self-pay | Admitting: Cardiology

## 2023-01-09 NOTE — Telephone Encounter (Signed)
Pt last saw Dr Bing Matter 11/08/22, last labs 11/16/22 Creat 0.98, age 62, weight 82.1kg, based on specified criteria pt is on appropriate dosage of Eliquis  BID for afib.  Will refill rx.

## 2023-01-11 ENCOUNTER — Encounter: Payer: Self-pay | Admitting: Cardiology

## 2023-01-13 ENCOUNTER — Encounter: Payer: Self-pay | Admitting: Pulmonary Disease

## 2023-01-16 ENCOUNTER — Encounter: Payer: Self-pay | Admitting: Pulmonary Disease

## 2023-01-17 ENCOUNTER — Other Ambulatory Visit: Payer: Self-pay | Admitting: Adult Health

## 2023-01-17 MED ORDER — PREDNISONE 20 MG PO TABS: ORAL_TABLET | ORAL | 0 refills | Status: AC

## 2023-01-17 NOTE — Telephone Encounter (Signed)
I have received the following message from patient   "I woke up this morning hurting in my chest and laryngitis again.  I am also been getting some rashes.  "  I have offered a office visit, just awaiting response. Dr. Judeth Horn please advise.

## 2023-02-07 ENCOUNTER — Other Ambulatory Visit: Payer: Self-pay | Admitting: Cardiology

## 2023-02-07 ENCOUNTER — Encounter: Payer: Self-pay | Admitting: Cardiology

## 2023-02-07 ENCOUNTER — Ambulatory Visit: Payer: Commercial Managed Care - HMO | Attending: Cardiology | Admitting: Cardiology

## 2023-02-07 VITALS — BP 118/70 | HR 62 | Ht 59.0 in | Wt 183.2 lb

## 2023-02-07 DIAGNOSIS — Z7984 Long term (current) use of oral hypoglycemic drugs: Secondary | ICD-10-CM

## 2023-02-07 DIAGNOSIS — E119 Type 2 diabetes mellitus without complications: Secondary | ICD-10-CM | POA: Diagnosis not present

## 2023-02-07 DIAGNOSIS — R0609 Other forms of dyspnea: Secondary | ICD-10-CM

## 2023-02-07 DIAGNOSIS — I5022 Chronic systolic (congestive) heart failure: Secondary | ICD-10-CM

## 2023-02-07 DIAGNOSIS — J45909 Unspecified asthma, uncomplicated: Secondary | ICD-10-CM

## 2023-02-07 DIAGNOSIS — I10 Essential (primary) hypertension: Secondary | ICD-10-CM

## 2023-02-07 NOTE — Progress Notes (Signed)
Cardiology Office Note:    Date:  02/07/2023   ID:  Crystal Marquez, DOB 1960-12-08, MRN [id number]  PCP:  Erskine Emery, NP  Cardiologist:  Gypsy Balsam, MD    Referring MD: Erskine Emery, NP   Chief Complaint  Patient presents with   Follow-up  Doing  .  Shortness of breath  History of Present Illness:    Crystal Marquez is a 62 y.o. 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  She does have bronchial asthma. Comes today to months for follow-up shortness of breath is a problem she thinks she got multiple allergies.  Prednisone has been given weight went up denies having swelling of lower extremities no proximal nocturnal dyspnea no palpitations.  Past Medical History:  Diagnosis Date   Age-related osteoporosis without current pathological fracture    Allergic rhinitis    Anxiety    Asthma    B12 deficiency 05/06/2020   Benign essential HTN 08/29/2019   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   Chronic kidney disease    Chronic pain syndrome    Class 2 obesity due to excess calories without serious comorbidity with body mass index (BMI) of 36.0 to 36.9 in adult 08/29/2019   Colitis 05/06/2020   Colon polyp    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HCC) 05/06/2020   Coronary atherosclerosis 10/06/2021   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   Dyslipidemia 08/29/2019   Dyspnea on exertion 10/06/2021   Fibromyalgia 09/30/2021   GERD (gastroesophageal reflux disease)    Hypercalcemia    Hypertension    Iron deficiency anemia due to chronic blood loss 05/06/2020   Low back pain 11/11/2021   Major depressive disorder, single episode    Mild intermittent asthma  without complication 07/29/2020   Myopathy    Neck pain 11/11/2021   Neuropathy    Pain in right hand 08/09/2018   Polyarthralgia 09/30/2021   Reflux esophagitis    Sjogren syndrome, unspecified (HCC) 09/30/2021   Sjogren's disease (HCC)    Spinal stenosis of lumbar region 12/13/2017   Tenosynovitis of left hand 08/09/2018   Type 2 diabetes mellitus without complication, without long-term current use of insulin (HCC) 08/29/2019   Unilateral primary osteoarthritis, right knee 11/03/2021    Past Surgical History:  Procedure Laterality Date   ABDOMINAL HYSTERECTOMY     CARPAL TUNNEL RELEASE Left 2018   CERVICAL FUSION  10/07/2019   Anterior Cervical Discectomy due to spinal stenosis   CESAREAN SECTION     COLONOSCOPY     UPPER GASTROINTESTINAL ENDOSCOPY      Current Medications: Current Meds  Medication Sig   albuterol (PROVENTIL) (2.5 MG/3ML) 0.083% nebulizer solution Take 2.5 mg by nebulization every 6 (six) hours as needed for wheezing or shortness of breath.   albuterol (VENTOLIN HFA) 108 (90 Base) MCG/ACT inhaler Inhale 1 puff into the lungs every 6 (six) hours as needed for wheezing or shortness of breath.   ALPRAZolam (XANAX) 0.5 MG tablet Take 0.5 mg by mouth 3 (three) times daily as needed for anxiety.   benzonatate (TESSALON) 200 MG capsule TAKE 1 CAPSULE BY MOUTH THREE TIMES A DAY AS NEEDED (Patient taking differently: Take 200 mg by mouth 3 (three) times daily as needed for cough.)   Cholecalciferol (VITAMIN  D3) 125 MCG (5000 UT) CAPS Take 5,000 Units by mouth daily.   DULoxetine (CYMBALTA) 60 MG capsule Take 120 mg by mouth daily.   ELIQUIS 5 MG TABS tablet TAKE 1 TABLET BY MOUTH TWICE A DAY   FARXIGA 10 MG TABS tablet Take 10 mg by mouth daily.   ferrous sulfate 325 (65 FE) MG tablet Take 325 mg by mouth daily with breakfast.   fexofenadine (ALLEGRA) 180 MG tablet Take 180 mg by mouth daily.   fluticasone-salmeterol (WIXELA INHUB) 500-50 MCG/ACT AEPB Inhale 1 puff into  the lungs in the morning and at bedtime.   Folic Acid 0.8 MG CAPS Take 2 capsules by mouth daily.   hydroxychloroquine (PLAQUENIL) 200 MG tablet Take 400 mg by mouth daily.   lidocaine (LIDODERM) 5 % Place 1 patch onto the skin daily. Remove & Discard patch within 12 hours or as directed by MD   metFORMIN (GLUCOPHAGE) 500 MG tablet Take 500 mg by mouth 2 (two) times daily with a meal.   methocarbamol (ROBAXIN) 500 MG tablet TAKE 1 TABLET BY MOUTH FOUR TIMES A DAY   metoprolol succinate (TOPROL-XL) 25 MG 24 hr tablet Take 2 tablets (50 mg total) by mouth daily. Take with or immediately following a meal.   montelukast (SINGULAIR) 10 MG tablet Take 10 mg by mouth at bedtime.   NYSTATIN powder Apply 1 application  topically daily as needed (under breast/folds of skin).   pantoprazole (PROTONIX) 40 MG tablet Take 40 mg by mouth daily.   polyethylene glycol (MIRALAX / GLYCOLAX) 17 g packet Take 17 g by mouth daily as needed for mild constipation.   pregabalin (LYRICA) 150 MG capsule Take 150 mg by mouth 3 (three) times daily.   rosuvastatin (CRESTOR) 5 MG tablet Take 5 mg by mouth daily.   sacubitril-valsartan (ENTRESTO) 49-51 MG Take 1 tablet by mouth 2 (two) times daily.   spironolactone (ALDACTONE) 25 MG tablet Take 0.5 tablets (12.5 mg total) by mouth daily.   Tezepelumab-ekko (TEZSPIRE) 210 MG/1. SOAJ Inject 210 mg into the skin every 28 (twenty-eight) days.   umeclidinium bromide (INCRUSE ELLIPTA) 62.5 MCG/ACT AEPB Inhale 1 puff into the lungs daily.     Allergies:   Lisinopril   Social History   Socioeconomic History   Marital status: Married    Spouse name: Not on file   Number of children: Not on file   Years of education: Not on file   Highest education level: Not on file  Occupational History   Not on file  Tobacco Use   Smoking status: Never   Smokeless tobacco: Never  Vaping Use   Vaping Use: Never used  Substance and Sexual Activity   Alcohol use: No    Alcohol/week:  0.0 standard drinks of alcohol   Drug use: No   Sexual activity: Not on file  Other Topics Concern   Not on file  Social History Narrative   Not on file   Social Determinants of Health   Financial Resource Strain: Not on file  Food Insecurity: No Food Insecurity (07/20/2022)   Hunger Vital Sign    Worried About Running Out of Food in the Last Year: Never true    Ran Out of Food in the Last Year: Never true  Transportation Needs: No Transportation Needs (07/20/2022)   PRAPARE - Administrator, Civil Service (Medical): No    Lack of Transportation (Non-Medical): No  Physical Activity: Not on file  Stress: Not on file  Social Connections: Not on file     Family History: The patient's family history includes Asthma in her mother; COPD in her mother; Colon polyps in her father and sister; Crohn's disease in her sister; Depression in her father; Diabetes in her father and mother; Diverticulitis in her sister; Heart disease in her father, maternal grandmother, and paternal grandmother; Hypercholesterolemia in her sister; Hypertension in her mother; Kidney disease in her brother and mother; Skin cancer in her father. There is no history of Colon cancer, Rectal cancer, or Stomach cancer. ROS:   Please see the history of present illness.    All 14 point review of systems negative except as described per history of present illness  EKGs/Labs/Other Studies Reviewed:      Recent Labs: 10/20/2022: B Natriuretic Peptide 104.2; Hemoglobin 14.6; Platelets 163 11/16/2022: BUN 9; Creatinine, Ser 0.98; Potassium 4.6; Sodium 144  Recent Lipid Panel    Component Value Date/Time   CHOL 122 07/20/2022 0459   TRIG 70 07/20/2022 0459   HDL 61 07/20/2022 0459   CHOLHDL 2.0 07/20/2022 0459   VLDL 14 07/20/2022 0459   LDLCALC 47 07/20/2022 0459    Physical Exam:    VS:  BP 118/70 (BP Location: Left Arm, Patient Position: Sitting)   Pulse 62   Ht 4\' 11"  (1.499 m)   Wt 183 lb 3.2 oz (83.1  kg)   LMP  (LMP Unknown)   SpO2 97%   BMI 37.00 kg/m     Wt Readings from Last 3 Encounters:  02/07/23 183 lb 3.2 oz (83.1 kg)  12/26/22 181 lb (82.1 kg)  11/08/22 174 lb (78.9 kg)     GEN:  Well nourished, well developed in no acute distress HEENT: Normal NECK: No JVD; No carotid bruits LYMPHATICS: No lymphadenopathy CARDIAC: RRR, no murmurs, no rubs, no gallops RESPIRATORY:  Clear to auscultation without rales, wheezing or rhonchi  ABDOMEN: Soft, non-tender, non-distended MUSCULOSKELETAL:  No edema; No deformity  SKIN: Warm and dry LOWER EXTREMITIES: no swelling NEUROLOGIC:  Alert and oriented x 3 PSYCHIATRIC:  Normal affect   ASSESSMENT:    1. Chronic systolic congestive heart failure (HCC)   2. Benign essential HTN   3. Primary hypertension   4. Type 2 diabetes mellitus without complication, without long-term current use of insulin (HCC)    PLAN:    In order of problems listed above:  Cardiomyopathy nonischemic in origin.  I will check proBNP will repeat echocardiogram to make sure her dyspnea on exertion is not related to her cardiomyopathy.  I suspect it is more asthma related.  I recommended follow-up with allergist. Benign essential hypertension blood pressure well-controlled continue present management. Type 2 diabetes followed by antimedicine team I did review K PN which show me her hemoglobin A1c of 5.8 from July 20, 2022. Dyslipidemia I did review K PN which show me LDL 47 HDL 61 continue present management. Paroxysmal atrial fibrillation continue anticoagulation denies have any palpitation   Medication Adjustments/Labs and Tests Ordered: Current medicines are reviewed at length with the patient today.  Concerns regarding medicines are outlined above.  No orders of the defined types were placed in this encounter.  Medication changes: No orders of the defined types were placed in this encounter.   Signed, Georgeanna Lea, MD, [facility] 02/07/2023 10:10  AM    [facility]

## 2023-02-07 NOTE — Patient Instructions (Addendum)
Medication Instructions:  Your physician recommends that you continue on your current medications as directed. Please refer to the Current Medication list given to you today.  *If you need a refill on your cardiac medications before your next appointment, please call your pharmacy*   Lab Work: ProBNP, BMP- today If you have labs (blood work) drawn today and your tests are completely normal, you will receive your results only by: MyChart Message (if you have MyChart) OR A paper copy in the mail If you have any lab test that is abnormal or we need to change your treatment, we will call you to review the results.   Testing/Procedures: Your physician has requested that you have an echocardiogram. Echocardiography is a painless test that uses sound waves to create images of your heart. It provides your doctor with information about the size and shape of your heart and how well your heart's chambers and valves are working. This procedure takes approximately one hour. There are no restrictions for this procedure. Please do NOT wear cologne, perfume, aftershave, or lotions (deodorant is allowed). Please arrive 15 minutes prior to your appointment time.    Follow-Up: At [facility], you and your health needs are our priority.  As part of our continuing mission to provide you with exceptional heart care, we have created designated Provider Care Teams.  These Care Teams include your primary Cardiologist (physician) and Advanced Practice Providers (APPs -  Physician Assistants and Nurse Practitioners) who all work together to provide you with the care you need, when you need it.  We recommend signing up for the patient portal called "MyChart".  Sign up information is provided on this After Visit Summary.  MyChart is used to connect with patients for Virtual Visits (Telemedicine).  Patients are able to view lab/test results, encounter notes, upcoming appointments, etc.  Non-urgent messages can be sent to  your provider as well.   To learn more about what you can do with MyChart, go to [email or web address].    Your next appointment:   6 month(s)  The format for your next appointment:   In Person  Provider:   Gypsy Balsam, MD    Other Instructions: Referral to Dr. Lucie Leather

## 2023-02-07 NOTE — Addendum Note (Signed)
Addended by: Baldo Ash D on: 02/07/2023 10:24 AM   Modules accepted: Orders

## 2023-02-07 NOTE — Addendum Note (Signed)
Addended by: Roosvelt Harps R on: 02/07/2023 02:20 PM   Modules accepted: Orders

## 2023-02-08 LAB — BASIC METABOLIC PANEL
BUN/Creatinine Ratio: 11 — ABNORMAL LOW (ref 12–28)
BUN: 10 mg/dL (ref 8–27)
CO2: 20 mmol/L (ref 20–29)
Calcium: 9.5 mg/dL (ref 8.7–10.3)
Chloride: 105 mmol/L (ref 96–106)
Creatinine, Ser: 0.94 mg/dL (ref 0.57–1.00)
Glucose: 119 mg/dL — ABNORMAL HIGH (ref 70–99)
Potassium: 4.5 mmol/L (ref 3.5–5.2)
Sodium: 141 mmol/L (ref 134–144)
eGFR: 69 mL/min/{1.73_m2} (ref >59)

## 2023-02-08 LAB — PRO B NATRIURETIC PEPTIDE: NT-Pro BNP: 331 pg/mL — ABNORMAL HIGH (ref 0–287)

## 2023-02-12 ENCOUNTER — Other Ambulatory Visit: Payer: Self-pay | Admitting: Orthopaedic Surgery

## 2023-03-06 ENCOUNTER — Ambulatory Visit: Payer: Commercial Managed Care - HMO | Attending: Cardiology

## 2023-03-06 ENCOUNTER — Other Ambulatory Visit: Payer: Self-pay | Admitting: Cardiology

## 2023-03-06 DIAGNOSIS — R0609 Other forms of dyspnea: Secondary | ICD-10-CM

## 2023-03-06 LAB — ECHOCARDIOGRAM COMPLETE
Calc EF: 52.2 %
P 1/2 time: 945 msec
S' Lateral: 3.3 cm
Single Plane A2C EF: 50.5 %
Single Plane A4C EF: 53.8 %

## 2023-03-07 ENCOUNTER — Other Ambulatory Visit: Payer: Self-pay | Admitting: Pulmonary Disease

## 2023-03-10 ENCOUNTER — Telehealth: Payer: Self-pay

## 2023-03-10 NOTE — Telephone Encounter (Signed)
Patient notified through my chart.

## 2023-03-10 NOTE — Telephone Encounter (Signed)
-----   Message from Georgeanna Lea, MD sent at 03/09/2023 11:44 AM EDT ----- Echocardiogram showed preserved left ventricle ejection fraction, overall looks good

## 2023-03-17 ENCOUNTER — Encounter: Payer: Self-pay | Admitting: Pulmonary Disease

## 2023-03-17 ENCOUNTER — Other Ambulatory Visit: Payer: Self-pay

## 2023-03-17 MED ORDER — FLUTICASONE-SALMETEROL 500-50 MCG/ACT IN AEPB: 1.0000 | INHALATION_SPRAY | Freq: Two times a day (BID) | RESPIRATORY_TRACT | 6 refills | Status: DC

## 2023-03-25 ENCOUNTER — Other Ambulatory Visit: Payer: Self-pay | Admitting: Cardiology

## 2023-03-25 ENCOUNTER — Encounter: Payer: Self-pay | Admitting: Cardiology

## 2023-03-27 MED ORDER — METOPROLOL SUCCINATE ER 25 MG PO TB24: 50.0000 mg | ORAL_TABLET | Freq: Every day | ORAL | 3 refills | Status: DC

## 2023-03-28 ENCOUNTER — Other Ambulatory Visit: Payer: Self-pay

## 2023-03-28 ENCOUNTER — Other Ambulatory Visit: Payer: Self-pay | Admitting: Internal Medicine

## 2023-03-28 ENCOUNTER — Ambulatory Visit (INDEPENDENT_AMBULATORY_CARE_PROVIDER_SITE_OTHER): Payer: Commercial Managed Care - HMO | Admitting: Internal Medicine

## 2023-03-28 ENCOUNTER — Encounter: Payer: Self-pay | Admitting: Internal Medicine

## 2023-03-28 VITALS — BP 128/80 | HR 74 | Temp 97.5°F | Resp 16 | Ht 59.0 in | Wt 182.9 lb

## 2023-03-28 DIAGNOSIS — J310 Chronic rhinitis: Secondary | ICD-10-CM

## 2023-03-28 DIAGNOSIS — J4520 Mild intermittent asthma, uncomplicated: Secondary | ICD-10-CM | POA: Diagnosis not present

## 2023-03-28 DIAGNOSIS — J300 Vasomotor rhinitis: Secondary | ICD-10-CM

## 2023-03-28 MED ORDER — IPRATROPIUM BROMIDE 0.03 % NA SOLN: 2.0000 | Freq: Three times a day (TID) | NASAL | 12 refills | Status: DC | PRN

## 2023-03-28 MED ORDER — AIRSUPRA 90-80 MCG/ACT IN AERO: 2.0000 | INHALATION_SPRAY | RESPIRATORY_TRACT | 1 refills | Status: DC | PRN

## 2023-03-28 MED ORDER — LEVOCETIRIZINE DIHYDROCHLORIDE 5 MG PO TABS: 5.0000 mg | ORAL_TABLET | Freq: Every evening | ORAL | 5 refills | Status: DC

## 2023-03-28 NOTE — Addendum Note (Signed)
Addended by: Orson Aloe on: 03/28/2023 07:05 PM   Modules accepted: Orders

## 2023-03-28 NOTE — Patient Instructions (Addendum)
Persistent Asthma: managed by pulmonary Allergy testing today was negative on both skin and intradermal testing - your lung testing today looked normal - Controller Inhaler:continue Wixela and Incruse as prescribed by Pulmonary - Rinse mouth out after use - Rescue Inhaler: Airsupra 2 puffs. Use  every 4-6 hours as needed for chest tightness, wheezing, or coughing.  Can also use 15 minutes prior to exercise if you have symptoms with activity. Maxium 12 puffs/day.  Try with co-pay card to see if better coverage - Asthma is not controlled if:  - Symptoms are occurring >2 times a week OR  - >2 times a month nighttime awakenings  - You are requiring systemic steroids (prednisone/steroid injections) more than once per year  - Your require hospitalization for your asthma.  - Please call the clinic to schedule a follow up if these symptoms arise  Chronic Rhinitis: determined to be Nonallergic: - allergy testing today: negative on both skin and intradermal testing - Symptom control: - Start Nasocort (triamcinolone)1- 2 sprays in each nostril daily.  - All can be purchased over-the-counter if not covered by insurance. - Start Atrovent (Ipratropium Bromide) 1-2 sprays in each nostril up to 3 times a day as needed for runny nose/post nasal drip/drainage.   - Use less frequently if airway gets too dry. - Continue Singulair (Montelukast) 10mg  nightly.   - Discontinue if nightmares of behavior changes. - Continue Antihistamine: daily or daily as needed.   -Options include Zyrtec (Cetirizine) 10mg , Claritin (Loratadine) 10mg , Allegra (Fexofenadine) 180mg , or Xyzal (Levocetirinze) 5mg  - Can be purchased over-the-counter if not covered by insurance.  Nonallergic Conjunctivitis:  - Consider Rewetting Drops such as Systane,TheraTears, etc  -Avoid eye drops that say red eye relief as they may contain medications that dry out your eyes.  Follow up : 6 months, sooner if needed It was a pleasure meeting you  in clinic today! Thank you for allowing me to participate in your care.  Tonny Bollman, MD [facility]

## 2023-03-28 NOTE — Progress Notes (Signed)
NEW PATIENT Date of Service/Encounter:  03/28/23 Referring provider: Georgeanna Lea, MD Primary care provider: Erskine Emery, NP  Subjective:  Crystal Marquez is a 62 y.o. female with a PMHx of Sjogren's, B12 deficiency, hypertension, dyslipidemia, spinal stenosis of lumbar region, type 2 diabetes, chronic kidney disease, GERD, depression, Sjogren's, fibromyalgia, chronic systolic congestive heart failure, paroxysmal atrial fibrillation presenting today for evaluation of asthma and allergies. History obtained from: chart review and patient.   Asthma History:  -Diagnosed in adulthood, diagnosed after getting married. She feels pine trees are what started her asthma since she started getting real pines at [employer].  Her asthma seemed to improve once stopping getting real trees. She has Covid 19 infection in October 2022 and after that has had worsening lung and heart disease.  -Current symptoms include cough, shortness of breath, and wheezing - using rescue inhaler once per week - reports daily symptoms and wakes up coughing almost every night. -Limitations to daily activity: severe - 1 ED visits, 1 UC visits and 8-12 oral steroids in the past year, almost montly - 0 number of lifetime hospitalizations, 0 number of lifetime intubations.  - Identified Triggers:  unknown, exercise - Up-to-date with pneumonia,, Covid-19,, and Flu, vaccines. - Smoking exposure: no Previous Diagnostics:   - PFTs on 08/15/22: normal - Most Recent AEC (10/20/22): 200 -Most Recent Chest Imaging: CXR on (10/20/22): The heart is enlarged and the mediastinal contour is within normal limits. No consolidation, effusion, or pneumothorax. Cervical spinal fusion hardware is noted. Mild degenerative changes in the thoracic spine. Management:  - Current regimen:  - Maintenance: wixela, incruse and tezspire. - Rescue: Albuterol 2 puffs q4-6 hrs PRN  Chronic rhinitis:  Symptoms include:  water eyes (but has  sjogrens ansd has dry eyes), rhinorrhea, and post nasal drainage  Occurs seasonally-fall and spring Potential triggers: unknown Treatments tried: allegra, montelukast Previous allergy testing: yes-she reports getting skin tested around a year ago by her primary care which was negative.  She has lab work done from 2023 that was also negative  Chart Review:  She is followed by pulmonary with last visit 12/26/2022.  Poorly controlled asthma despite triple therapy with Wixela and Incruse.  On Tezspire. Concern for possible seasonal allergies.  Most recent Echo: LV ejection fraction 55-60%, Grade II diastolic dysfunction. 03/25/22: allergen profile negative, total IgE 18  Other allergy screening: Food allergy: no Medication allergy:  lisinopril-angioedema Eczema:no History of recurrent infections suggestive of immunodeficency: no Vaccinations are up to date.   Past Medical History: Past Medical History:  Diagnosis Date   Age-related osteoporosis without current pathological fracture    Allergic rhinitis    Anxiety    Asthma    B12 deficiency 05/06/2020   Benign essential HTN 08/29/2019   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   Chronic kidney disease    Chronic pain syndrome    Class 2 obesity due to excess calories without serious comorbidity with body mass index (BMI) of 36.0 to 36.9 in adult 08/29/2019   Colitis 05/06/2020   Colon polyp    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HCC) 05/06/2020   Coronary atherosclerosis 10/06/2021   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   Dyslipidemia 08/29/2019   Dyspnea on exertion 10/06/2021   Fibromyalgia 09/30/2021   GERD (gastroesophageal reflux disease)    Hypercalcemia    Hypertension    Iron deficiency anemia due to chronic blood loss 05/06/2020   Low back pain 11/11/2021   Major depressive disorder, single episode  Mild intermittent asthma without complication 07/29/2020   Myopathy    Neck pain 11/11/2021   Neuropathy    Pain  in right hand 08/09/2018   Polyarthralgia 09/30/2021   Reflux esophagitis    Sjogren syndrome, unspecified (HCC) 09/30/2021   Sjogren's disease (HCC)    Spinal stenosis of lumbar region 12/13/2017   Tenosynovitis of left hand 08/09/2018   Type 2 diabetes mellitus without complication, without long-term current use of insulin (HCC) 08/29/2019   Unilateral primary osteoarthritis, right knee 11/03/2021   Medication List:  Current Outpatient Medications  Medication Sig Dispense Refill   albuterol (PROVENTIL) (2.5 MG/3ML) 0.083% nebulizer solution Take 2.5 mg by nebulization every 6 (six) hours as needed for wheezing or shortness of breath.     albuterol (VENTOLIN HFA) 108 (90 Base) MCG/ACT inhaler Inhale 1 puff into the lungs every 6 (six) hours as needed for wheezing or shortness of breath.     ALPRAZolam (XANAX) 0.5 MG tablet Take 0.5 mg by mouth 3 (three) times daily as needed for anxiety.     benzonatate (TESSALON) 200 MG capsule TAKE 1 CAPSULE BY MOUTH THREE TIMES A DAY AS NEEDED (Patient taking differently: Take 200 mg by mouth 3 (three) times daily as needed for cough.) 45 capsule 3   Cholecalciferol (VITAMIN D3) 125 MCG (5000 UT) CAPS Take 5,000 Units by mouth daily.     DULoxetine (CYMBALTA) 60 MG capsule Take 120 mg by mouth daily.     ELIQUIS 5 MG TABS tablet TAKE 1 TABLET BY MOUTH TWICE A DAY 60 tablet 5   FARXIGA 10 MG TABS tablet Take 10 mg by mouth daily.     fexofenadine (ALLEGRA) 180 MG tablet Take 180 mg by mouth daily.     fluticasone-salmeterol (WIXELA INHUB) 500-50 MCG/ACT AEPB Inhale 1 puff into the lungs in the morning and at bedtime. 60 each 6   Folic Acid 0.8 MG CAPS Take 2 capsules by mouth daily.     hydroxychloroquine (PLAQUENIL) 200 MG tablet Take 400 mg by mouth daily.  0   INCRUSE ELLIPTA 62.5 MCG/ACT AEPB TAKE 1 PUFF BY MOUTH EVERY DAY 30 each 6   lidocaine (LIDODERM) 5 % Place 1 patch onto the skin daily. Remove & Discard patch within 12 hours or as directed by MD  30 patch 0   metFORMIN (GLUCOPHAGE) 500 MG tablet Take 500 mg by mouth 2 (two) times daily with a meal.     methocarbamol (ROBAXIN) 500 MG tablet TAKE 1 TABLET BY MOUTH FOUR TIMES A DAY 40 tablet 1   metoprolol succinate (TOPROL-XL) 25 MG 24 hr tablet Take 2 tablets (50 mg total) by mouth daily. Take with or immediately following a meal. 90 tablet 3   montelukast (SINGULAIR) 10 MG tablet Take 10 mg by mouth at bedtime.     NYSTATIN powder Apply 1 application  topically daily as needed (under breast/folds of skin).     pantoprazole (PROTONIX) 40 MG tablet Take 40 mg by mouth daily.     polyethylene glycol (MIRALAX / GLYCOLAX) 17 g packet Take 17 g by mouth daily as needed for mild constipation.     pregabalin (LYRICA) 150 MG capsule Take 150 mg by mouth 3 (three) times daily.     rosuvastatin (CRESTOR) 5 MG tablet Take 5 mg by mouth daily.     sacubitril-valsartan (ENTRESTO) 49-51 MG Take 1 tablet by mouth 2 (two) times daily. 180 tablet 3   spironolactone (ALDACTONE) 25 MG tablet Take 0.5 tablets (  12.5 mg total) by mouth daily. 45 tablet 3   Tezepelumab-ekko (TEZSPIRE) 210 MG/1. SOAJ Inject 210 mg into the skin every 28 (twenty-eight) days. 1.91 mL 5   triamcinolone (KENALOG) 0.1 % paste Use as directed 1 Application in the mouth or throat 2 (two) times daily.     ferrous sulfate 325 (65 FE) MG tablet Take 325 mg by mouth daily with breakfast. (Patient not taking: Reported on 03/28/2023)     No current facility-administered medications for this visit.   Known Allergies:  Allergies  Allergen Reactions   Lisinopril Shortness Of Breath    Bloating    Past Surgical History: Past Surgical History:  Procedure Laterality Date   ABDOMINAL HYSTERECTOMY     CARPAL TUNNEL RELEASE Left 2018   CERVICAL FUSION  10/07/2019   Anterior Cervical Discectomy due to spinal stenosis   CESAREAN SECTION     COLONOSCOPY     UPPER GASTROINTESTINAL ENDOSCOPY     Family History: Family History  Problem  Relation Age of Onset   Asthma Mother    COPD Mother    Diabetes Mother    Hypertension Mother    Kidney disease Mother    Heart disease Father    Colon polyps Father    Skin cancer Father    Depression Father    Diabetes Father    Colon polyps Sister    Crohn's disease Sister    Diverticulitis Sister    Hypercholesterolemia Sister    Kidney disease Brother    Heart disease Maternal Grandmother    Heart disease Paternal Grandmother    Colon cancer Neg Hx    Rectal cancer Neg Hx    Stomach cancer Neg Hx    Social History: Williette lives in an apartment, no water damage, carpet floors, electric heating, central AC, no pets indoors, cats outdoors, no roaches, no dust mite protection on the bedding or pillows, no smoke exposure, no HEPA filter in the home.  [place]   ROS:  All other systems negative except as noted per HPI.  Objective:  Blood pressure 128/80, pulse 74, temperature (!) 97.5 F (36.4 C), resp. rate 16, height 4\' 11"  (1.499 m), weight 182 lb 14.4 oz (83 kg), SpO2 97 %. Body mass index is 36.94 kg/m. Physical Exam:  General Appearance:  Alert, cooperative, no distress, appears stated age  Head:  Normocephalic, without obvious abnormality, atraumatic  Eyes:  Conjunctiva clear, EOM's intact  Nose: Nares normal, normal mucosa and no visible anterior polyps  Throat: Lips, tongue normal; teeth and gums normal, normal posterior oropharynx  Neck: Supple, symmetrical  Lungs:   clear to auscultation bilaterally, Respirations unlabored, no coughing  Heart:  regular rate and rhythm and no murmur, Appears well perfused  Extremities: No edema  Skin: Skin color, texture, turgor normal and no rashes or lesions on visualized portions of skin  Neurologic: No gross deficits     Diagnostics: Spirometry:  Tracings reviewed. Her effort: Good reproducible efforts. FVC: 2.45L  FEV1: 2.03L, 97% predicted  FEV1/FVC ratio: 0.83  Interpretation:  Spirometry consistent with normal pattern.  Please see scanned spirometry results for details.  Skin Testing: Environmental allergy panel. Adequate positive and negative controls. Results discussed with patient/family.  Airborne Adult Perc - 03/28/23 1431     Time Antigen Placed 1431    Allergen Manufacturer Waynette Buttery    Location Back    Number of Test 55    1. Control-Buffer 50% Glycerol Negative  2. Control-Histamine 2+    3. Bahia Negative    4. French Southern Territories Negative    5. Johnson Negative    6. Kentucky Blue Negative    7. Meadow Fescue Negative    8. Perennial Rye Negative    9. Timothy Negative    10. Ragweed Mix Negative    11. Cocklebur Negative    12. Plantain,  English Negative    13. Baccharis Negative    14. Dog Fennel Negative    15. Russian Thistle Negative    16. Lamb's Quarters Negative    17. Sheep Sorrell Negative    18. Rough Pigweed Negative    19. Marsh Elder, Rough Negative    20. Mugwort, Common Negative    21. Box, Elder Negative    22. Cedar, red Negative    23. Sweet Gum Negative    24. Pecan Pollen Negative    25. Pine Mix Negative    26. Walnut, Black Pollen Negative    27. Red Mulberry Negative    28. Ash Mix Negative    29. Birch Mix Negative    30. Beech American Negative    31. Cottonwood, [place] Negative    32. Hickory, White Negative    33. Maple Mix Negative    34. Oak, [place] Mix Negative    35. Sycamore Eastern Negative    36. Alternaria Alternata Negative    37. Cladosporium Herbarum Negative    38. Aspergillus Mix Negative    39. Penicillium Mix Negative    40. Bipolaris Sorokiniana (Helminthosporium) Negative    41. Drechslera Spicifera (Curvularia) Negative    42. Mucor Plumbeus Negative    43. Fusarium Moniliforme Negative    44. Aureobasidium Pullulans (pullulara) Negative    45. Rhizopus Oryzae Negative    46. Botrytis Cinera Negative    47. Epicoccum Nigrum Negative    48. Phoma Betae Negative    49. Dust Mite Mix  Negative    50. Cat Hair 10,000 BAU/ml Negative    51.  Dog Epithelia Negative    52. Mixed Feathers Negative    53. Horse Epithelia Negative    54. Cockroach, German Negative    55. Tobacco Leaf Negative             Intradermal - 03/28/23 1555     Time Antigen Placed 1555    Allergen Manufacturer Waynette Buttery    Location Back    Control Negative    Bahia Negative    French Southern Territories Negative    Johnson Negative    7 Grass Negative    Ragweed Mix Negative    Weed Mix Negative    Tree Mix Negative    Mold 1 Negative    Mold 2 Negative    Mold 3 Negative    Mold 4 Negative    Mite Mix Negative    Cat Negative    Dog Negative    Cockroach Negative             Allergy testing results were read and interpreted by myself, documented by clinical staff.  Assessment and Plan  Persistent Asthma: managed by pulmonary; nonallergic Allergy testing today was negative on both skin and intradermal testing Agree with current management by Pulmonary.  Will add some nasal sprays to help with drainage.  - your lung testing today looked normal - Controller Inhaler: continue Wixela and Incruse as prescribed by Pulmonary - Rinse mouth out after use - Rescue Inhaler: Airsupra 2 puffs. Use  every 4-6 hours as needed for chest tightness, wheezing, or coughing.  Can also use 15 minutes prior to exercise if you have symptoms with activity. Maxium 12 puffs/day.  Try with co-pay card to see if better coverage - Asthma is not controlled if:  - Symptoms are occurring >2 times a week OR  - >2 times a month nighttime awakenings  - You are requiring systemic steroids (prednisone/steroid injections) more than once per year  - Your require hospitalization for your asthma.  - Please call the clinic to schedule a follow up if these symptoms arise  Chronic Rhinitis: determined to be Nonallergic: Likely vasomotor - allergy testing today: negative on both skin and intradermal testing - Symptom control: - Start  Nasocort (triamcinolone)1- 2 sprays in each nostril daily.  - All can be purchased over-the-counter if not covered by insurance. - Start Atrovent (Ipratropium Bromide) 1-2 sprays in each nostril up to 3 times a day as needed for runny nose/post nasal drip/drainage.   - Use less frequently if airway gets too dry. - Continue Singulair (Montelukast) 10mg  nightly.   - Discontinue if nightmares of behavior changes. - Continue Antihistamine: daily or daily as needed.   -Options include Zyrtec (Cetirizine) 10mg , Claritin (Loratadine) 10mg , Allegra (Fexofenadine) 180mg , or Xyzal (Levocetirinze) 5mg  - Can be purchased over-the-counter if not covered by insurance.  Nonallergic Conjunctivitis: Likely related to Sjogren's - Consider Rewetting Drops such as Systane,TheraTears, etc  -Avoid eye drops that say red eye relief as they may contain medications that dry out your eyes.  Follow up : 6 months, sooner if needed It was a pleasure meeting you in clinic today! Thank you for allowing me to participate in your care.  This note in its entirety was forwarded to the Provider who requested this consultation.  Thank you for your kind referral. I appreciate the opportunity to take part in Dorann's care. Please do not hesitate to contact me with questions.  Sincerely,  Tonny Bollman, MD [facility]

## 2023-03-29 ENCOUNTER — Other Ambulatory Visit: Payer: Self-pay | Admitting: Internal Medicine

## 2023-03-29 NOTE — Telephone Encounter (Signed)
thanks

## 2023-03-29 NOTE — Telephone Encounter (Signed)
Have our drug rep jeff looking into this for [place]

## 2023-04-05 ENCOUNTER — Other Ambulatory Visit: Payer: Self-pay | Admitting: Physician Assistant

## 2023-04-27 DIAGNOSIS — K5900 Constipation, unspecified: Secondary | ICD-10-CM | POA: Insufficient documentation

## 2023-04-27 DIAGNOSIS — J4541 Moderate persistent asthma with (acute) exacerbation: Secondary | ICD-10-CM | POA: Insufficient documentation

## 2023-04-27 DIAGNOSIS — Z6838 Body mass index (BMI) 38.0-38.9, adult: Secondary | ICD-10-CM | POA: Insufficient documentation

## 2023-06-12 ENCOUNTER — Encounter: Payer: Self-pay | Admitting: Pulmonary Disease

## 2023-06-13 ENCOUNTER — Other Ambulatory Visit: Payer: Self-pay | Admitting: Physician Assistant

## 2023-06-16 NOTE — Telephone Encounter (Signed)
Needs an OV. Hasn't been seen since April. If not on steroids right now, ok to send taper in interim. Prednisone 10 mg tabs - 4 tabs for 3 days, then 3 tabs for 3 days, 2 tabs for 3 days, then 1 tab for 3 days, then stop. Take in AM with food. Use nebs 2-3 times a day as well as scheduled inhalers. If symptoms worsen, she has low O2 levels, or is unable to talk in complete sentences, needs to go to the ED. Thanks.

## 2023-06-16 NOTE — Telephone Encounter (Signed)
Dr. Judeth Horn is out of the office. Florentina Addison can you advise?

## 2023-06-19 MED ORDER — PREDNISONE 10 MG PO TABS: ORAL_TABLET | ORAL | 0 refills | Status: DC

## 2023-06-26 ENCOUNTER — Other Ambulatory Visit: Payer: Self-pay | Admitting: Cardiology

## 2023-06-26 NOTE — Telephone Encounter (Signed)
Prescription refill request for Eliquis received. Indication:afib Last office visit:5/24 Scr:0.94  5/24 Age: 62 Weight:83  kg  Prescription refilled

## 2023-06-28 ENCOUNTER — Ambulatory Visit (INDEPENDENT_AMBULATORY_CARE_PROVIDER_SITE_OTHER): Payer: Commercial Managed Care - HMO | Admitting: Pulmonary Disease

## 2023-06-28 ENCOUNTER — Encounter: Payer: Self-pay | Admitting: Pulmonary Disease

## 2023-06-28 VITALS — BP 118/66 | HR 60 | Temp 98.3°F | Ht 59.0 in | Wt 186.8 lb

## 2023-06-28 DIAGNOSIS — R0609 Other forms of dyspnea: Secondary | ICD-10-CM

## 2023-06-28 DIAGNOSIS — J4551 Severe persistent asthma with (acute) exacerbation: Secondary | ICD-10-CM | POA: Diagnosis not present

## 2023-06-28 MED ORDER — PREDNISONE 10 MG PO TABS: ORAL_TABLET | ORAL | 0 refills | Status: DC

## 2023-06-28 MED ORDER — AZITHROMYCIN 250 MG PO TABS: ORAL_TABLET | ORAL | 0 refills | Status: AC

## 2023-06-28 NOTE — Patient Instructions (Signed)
Prednisone 40 mg for 5 days 20 mg for 5 days then 10 mg for 5 days and stop  Azithromycin 2 tablets on day 1 then 1 tablet days 2 through 5 prescribed today as well  Continue all inhalers  Paperwork today for Dupixent, okay to stop Tezspire since is not helping

## 2023-06-28 NOTE — Progress Notes (Signed)
@Patient  ID: Rodney Langton, female    DOB: 1961-07-22, 62 y.o.   MRN: [id number]  Chief Complaint  Patient presents with   Follow-up    Breathing has not improved since the last visit. She is having hard time coughing up any sputum. When she does produce sputum it's yellow. She has had pain between shoulders for the past wk and "feels like an elephant on my chest".  She is due to get Tezpire inj tomorrow.     Referring provider: Erskine Emery, NP  HPI:   62 y.o. woman whom we are seeing in follow up for dyspnea, wheezing felt to be related to asthma.  Most recent cardiology note reviewed.  Most recent allergy and immunology note reviewed.  Returns for scheduled follow-up.  Poorly controlled asthma despite triple inhaled therapy via Wixela and Incruse.  Has been on test by her now for several months.  Still with ongoing poorly controlled symptoms.  Recent prednisone course 05/2023.  Reviewed her TTE that demonstrated mild improvement in reduced EF but now worsened diastolic dysfunction.  Likely cardiac contributors to her shortness of breath and fatigue as well.   HPI at initial visit: Patient in normal state of health.  Got COVID October 2022.  Since then she is had intermittent issues with shortness of breath, wheezing.  Occasional cough.  Sometimes worse in the evenings.  Worse with exertion although can occur at rest as well.  The feeling of inability get deep breath, mild chest tightness.  Seems to come and go.  Some days are better than others.  No other seasonal environmental factors he can identify to make things better or worse.  Mild relief with prednisone, albuterol.  However after prednisone symptoms returned.  She reports history of recurrent bronchitis usually at least once a year sometimes twice a year with season changes.  Reviewed CT chest 08/23/2021 reveals clear lungs bilaterally on my review interpretation.  Reviewed echocardiogram 09/02/2021 that shows reduction in EF to 40%  as well as grade 2 diastolic dysfunction and dilated left atrium, moderate MVR, normal RA size and normal RV function and size.   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   Questionaires / Pulmonary Flowsheets:   ACT:  Asthma Control Test ACT Total Score  12/26/2022  9:39 AM 13  10/31/2022  9:01 AM 6  09/28/2022  9:44 AM 12    MMRC:     No data to display          Epworth:      No data to display          Tests:   FENO:  No results found for: "NITRICOXIDE"  PFT:    Latest Ref Rng & Units 08/15/2022    3:48 PM  PFT Results  FVC-Pre L 2.18   FVC-Predicted Pre % 79   FVC-Post L 2.30   FVC-Predicted Post % 83   Pre FEV1/FVC % % 85   Post FEV1/FCV % % 88   FEV1-Pre L 1.86   FEV1-Predicted Pre % 87   FEV1-Post L 2.02   DLCO uncorrected ml/min/mmHg 19.17   DLCO UNC% % 112   DLCO corrected ml/min/mmHg 18.06   DLCO COR %Predicted % 106   DLVA Predicted %  121   TLC L 3.98   TLC % Predicted % 92   RV % Predicted % 43   Personally reviewed interpreted as normal spirometry, no bronchodilator spots, lung wise within normal notes, DLCO within normal limits.  WALK:      No data to display          Imaging: Personally reviewed and as per EMR discussion this note No results found.  Lab Results: Personally reviewed via [facility] with mild elevation in eosinophils 200 and 02/2020 CBC    Component Value Date/Time   WBC 12.2 (H) 10/20/2022 0015   RBC 4.93 10/20/2022 0015   HGB 14.6 10/20/2022 0015   HGB 15.6 07/28/2022 1603   HCT 45.5 10/20/2022 0015   HCT 46.6 07/28/2022 1603   PLT 163 10/20/2022 0015   PLT 178 07/28/2022  1603   MCV 92.3 10/20/2022 0015   MCV 88 07/28/2022 1603   MCH 29.6 10/20/2022 0015   MCHC 32.1 10/20/2022 0015   RDW 13.9 10/20/2022 0015   RDW 14.1 07/28/2022 1603   LYMPHSABS 2.7 10/20/2022 0015   LYMPHSABS 1.6 07/28/2022 1603   MONOABS 0.9 10/20/2022 0015   EOSABS 0.2 10/20/2022 0015   EOSABS 0.4 07/28/2022 1603   BASOSABS 0.0 10/20/2022 0015   BASOSABS 0.0 07/28/2022 1603    BMET    Component Value Date/Time   NA 141 02/07/2023 1037   K 4.5 02/07/2023 1037   CL 105 02/07/2023 1037   CO2 20 02/07/2023 1037   GLUCOSE 119 (H) 02/07/2023 1037   GLUCOSE 134 (H) 10/20/2022 0015   BUN 10 02/07/2023 1037   CREATININE 0.94 02/07/2023 1037   CALCIUM 9.5 02/07/2023 1037   GFRNONAA >60 10/20/2022 0015    BNP    Component Value Date/Time   BNP 104.2 (H) 10/20/2022 0015    ProBNP    Component Value Date/Time   PROBNP 331 (H) 02/07/2023 1037    Specialty Problems       Pulmonary Problems   Mild intermittent asthma without complication   Allergic rhinitis   Asthma   Dyspnea on exertion   Vasomotor rhinitis    Allergies  Allergen Reactions   Lisinopril Shortness Of Breath    Bloating     Immunization History  Administered Date(s) Administered   Influenza,inj,Quad PF,6+ Mos 10/31/2022   Influenza,inj,quad, With Preservative 06/19/2017   Influenza-Unspecified 06/28/2021   PFIZER(Purple Top)SARS-COV-2 Vaccination 12/19/2019, 01/13/2020    Past Medical History:  Diagnosis Date   Age-related osteoporosis without current pathological fracture    Allergic rhinitis    Anxiety    Asthma    B12 deficiency 05/06/2020   Benign essential HTN 08/29/2019   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   Chronic kidney disease    Chronic pain syndrome    Class 2 obesity due to excess calories without serious comorbidity with body mass index (BMI) of 36.0 to 36.9 in adult 08/29/2019   Colitis 05/06/2020   Colon polyp    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HCC) 05/06/2020    Coronary atherosclerosis 10/06/2021   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   Dyslipidemia 08/29/2019   Dyspnea on exertion 10/06/2021   Fibromyalgia 09/30/2021   GERD (gastroesophageal reflux disease)    Hypercalcemia    Hypertension    Iron deficiency anemia due to chronic blood loss 05/06/2020   Low back pain 11/11/2021   Major depressive disorder, single episode    Mild intermittent asthma without complication 07/29/2020   Myopathy  Neck pain 11/11/2021   Neuropathy    Pain in right hand 08/09/2018   Polyarthralgia 09/30/2021   Reflux esophagitis    Sjogren syndrome, unspecified (HCC) 09/30/2021   Sjogren's disease (HCC)    Spinal stenosis of lumbar region 12/13/2017   Tenosynovitis of left hand 08/09/2018   Type 2 diabetes mellitus without complication, without long-term current use of insulin (HCC) 08/29/2019   Unilateral primary osteoarthritis, right knee 11/03/2021    Tobacco History: Social History   Tobacco Use  Smoking Status Never  Smokeless Tobacco Never   Counseling given: Not Answered   Continue to not smoke  Outpatient Encounter Medications as of 06/28/2023  Medication Sig   albuterol (PROVENTIL) (2.5 MG/3ML) 0.083% nebulizer solution Take 2.5 mg by nebulization every 6 (six) hours as needed for wheezing or shortness of breath.   Albuterol-Budesonide (AIRSUPRA) 90-80 MCG/ACT AERO TAKE 2 PUFFS BY MOUTH EVERY 4 HOURS AS NEEDED   ALPRAZolam (XANAX) 0.5 MG tablet Take 0.5 mg by mouth 3 (three) times daily as needed for anxiety.   azithromycin (ZITHROMAX) 250 MG tablet Take 2 tablets (500 mg total) by mouth daily for 1 day, THEN 1 tablet (250 mg total) daily for 4 days.   benzonatate (TESSALON) 200 MG capsule TAKE 1 CAPSULE BY MOUTH THREE TIMES A DAY AS NEEDED (Patient taking differently: Take 200 mg by mouth 3 (three) times daily as needed for cough.)   Cholecalciferol (VITAMIN D3) 125 MCG (5000 UT) CAPS Take 5,000 Units by mouth daily.   DULoxetine  (CYMBALTA) 60 MG capsule Take 120 mg by mouth daily.   ELIQUIS 5 MG TABS tablet TAKE 1 TABLET BY MOUTH TWICE A DAY   FARXIGA 10 MG TABS tablet Take 10 mg by mouth daily.   ferrous sulfate 325 (65 FE) MG tablet Take 325 mg by mouth daily with breakfast.   fexofenadine (ALLEGRA) 180 MG tablet Take 180 mg by mouth daily.   fluticasone-salmeterol (WIXELA INHUB) 500-50 MCG/ACT AEPB Inhale 1 puff into the lungs in the morning and at bedtime.   Folic Acid 0.8 MG CAPS Take 2 capsules by mouth daily.   hydroxychloroquine (PLAQUENIL) 200 MG tablet Take 400 mg by mouth daily.   INCRUSE ELLIPTA 62.5 MCG/ACT AEPB TAKE 1 PUFF BY MOUTH EVERY DAY   ipratropium (ATROVENT) 0.03 % nasal spray Place 2 sprays into both nostrils 3 (three) times daily as needed for rhinitis.   levocetirizine (XYZAL) 5 MG tablet Take 1 tablet (5 mg total) by mouth every evening.   lidocaine (LIDODERM) 5 % Place 1 patch onto the skin daily. Remove & Discard patch within 12 hours or as directed by MD   metFORMIN (GLUCOPHAGE) 500 MG tablet Take 500 mg by mouth 2 (two) times daily with a meal.   methocarbamol (ROBAXIN) 500 MG tablet TAKE 1 TABLET BY MOUTH FOUR TIMES A DAY   metoprolol succinate (TOPROL-XL) 25 MG 24 hr tablet Take 2 tablets (50 mg total) by mouth daily. Take with or immediately following a meal.   montelukast (SINGULAIR) 10 MG tablet Take 10 mg by mouth at bedtime.   NYSTATIN powder Apply 1 application  topically daily as needed (under breast/folds of skin).   pantoprazole (PROTONIX) 40 MG tablet Take 40 mg by mouth daily.   polyethylene glycol (MIRALAX / GLYCOLAX) 17 g packet Take 17 g by mouth daily as needed for mild constipation.   predniSONE (DELTASONE) 10 MG tablet 40mg  x 3 days, 30mg  x3 days, 20mg  x3 days, 10mg  x3 days  predniSONE (DELTASONE) 10 MG tablet Take 4 tablets (40 mg total) by mouth daily with breakfast for 5 days, THEN 2 tablets (20 mg total) daily with breakfast for 5 days, THEN 1 tablet (10 mg total)  daily with breakfast for 5 days.   pregabalin (LYRICA) 150 MG capsule Take 150 mg by mouth 3 (three) times daily.   rosuvastatin (CRESTOR) 5 MG tablet Take 5 mg by mouth daily.   sacubitril-valsartan (ENTRESTO) 49-51 MG Take 1 tablet by mouth 2 (two) times daily.   spironolactone (ALDACTONE) 25 MG tablet Take 0.5 tablets (12.5 mg total) by mouth daily.   Tezepelumab-ekko (TEZSPIRE) 210 MG/1. SOAJ Inject 210 mg into the skin every 28 (twenty-eight) days.   triamcinolone (KENALOG) 0.1 % paste Use as directed 1 Application in the mouth or throat 2 (two) times daily.   [DISCONTINUED] albuterol (VENTOLIN HFA) 108 (90 Base) MCG/ACT inhaler Inhale 1 puff into the lungs every 6 (six) hours as needed for wheezing or shortness of breath.   No facility-administered encounter medications on file as of 06/28/2023.     Review of Systems  Review of Systems  N/a Physical Exam  BP 118/66 (BP Location: Right Arm, Cuff Size: Large)   Pulse 60   Temp 98.3 F (36.8 C) (Oral)   Ht 4\' 11"  (1.499 m)   Wt 186 lb 12.8 oz (84.7 kg)   LMP  (LMP Unknown)   SpO2 97%   BMI 37.73 kg/m   Wt Readings from Last 5 Encounters:  06/28/23 186 lb 12.8 oz (84.7 kg)  03/28/23 182 lb 14.4 oz (83 kg)  02/07/23 183 lb 3.2 oz (83.1 kg)  12/26/22 181 lb (82.1 kg)  11/08/22 174 lb (78.9 kg)    BMI Readings from Last 5 Encounters:  06/28/23 37.73 kg/m  03/28/23 36.94 kg/m  02/07/23 37.00 kg/m  12/26/22 36.56 kg/m  11/08/22 35.14 kg/m     Physical Exam General: Well-appearing, sitting in chair Eyes: EOMI, icterus Neck: Supple, no JVP Pulmonary: Clear, no wheeze Cardiovascular: Regular in rhythm, no murmur Abdomen: Nondistended, bowel sounds present MSK: No synovitis, no joint effusion Neuro: Normal gait, no weakness Psych: Normal mood, full affect   Assessment & Plan:   Dyspnea on exertion: Likely multifactorial.  Contribution of cardiac causes given her reduced EF to 45% as well as left atrial  dilation and MVR and diastolic dysfunction on prior TTE, most recent TTE shows normalization of EF.  Fortunately right side looks reassuring in terms of pulmonary hypertension.  Additionally, high suspicion for poorly controlled asthma especially with exacerbation or worsening since COVID infection 06/2021.  Mild improvement inhalers.  See below for asthma.  Overall, likely multifactorial and difficult control cardiac and pulmonary disease.  Her lack of improvement with aggressive asthma therapy would argue for likely nonpulmonary contribution to her dyspnea.  Severe persistent asthma with exacerbation: Previous diagnosis.  Suspicious for uncontrolled asthma given worsening in symptoms after COVID infection, post viral.  Disease control was adequate on Trelegy high-dose.  However, no longer covered by insurance.  On Incruse and Wixela.  Still poorly controlled symptoms.  Tezspire 10/2022.  No real improvement with frequent exacerbations essentially steroid-dependent.  Stop test prior, paperwork for Dupixent today.  Worry about extrapulmonary causes as above.  Prednisone taper, Z-Pak prescribed   Return in about 3 months (around 09/28/2023) for f/u Dr. Judeth Horn.   Karren Burly, MD 06/28/2023  I spent 40 minutes in the care of the patient including face-to-face visit, coordination of care, review  of records.

## 2023-06-30 ENCOUNTER — Other Ambulatory Visit: Payer: Self-pay

## 2023-06-30 DIAGNOSIS — J4550 Severe persistent asthma, uncomplicated: Secondary | ICD-10-CM

## 2023-06-30 NOTE — Progress Notes (Signed)
Received New start paperwork for DUPIXENT. Will update as we work through the benefits process.  Submitted a Prior Authorization request to [employer] for DUPIXENT via CoverMyMeds. Will update once we receive a response.  Key: [id number]

## 2023-07-10 ENCOUNTER — Encounter: Payer: Self-pay | Admitting: Cardiology

## 2023-07-10 ENCOUNTER — Encounter: Payer: Self-pay | Admitting: Pulmonary Disease

## 2023-07-11 ENCOUNTER — Other Ambulatory Visit (HOSPITAL_COMMUNITY): Payer: Self-pay

## 2023-07-11 MED ORDER — DUPIXENT 300 MG/2ML ~~LOC~~ SOAJ
SUBCUTANEOUS | 0 refills | Status: DC
Start: 2023-07-11 — End: 2023-07-11

## 2023-07-11 NOTE — Progress Notes (Addendum)
Received notification from [employer] regarding a prior authorization for DUPIXENT. Authorization has been APPROVED from 06/30/23 to 12/27/23.  Per test claim, copay for 28 days supply is $1432.52  Patient can fill through [facility]: [phone]   Authorization # [id number]  Dupixent copay card activated via phone call: BIN: [id number] PCN: [id number] Group: [id number] ID: [id number]  Patient scheduled for Dupixent new start tomorrow, 07/12/23. Will plan to use sample since past is 6 weeks past last Tezspire dose.  Chesley Mires, PharmD, MPH, BCPS, CPP Clinical Pharmacist (Rheumatology and Pulmonology)

## 2023-07-12 ENCOUNTER — Ambulatory Visit: Payer: Commercial Managed Care - HMO | Admitting: Pharmacist

## 2023-07-12 DIAGNOSIS — Z7189 Other specified counseling: Secondary | ICD-10-CM

## 2023-07-12 DIAGNOSIS — J4550 Severe persistent asthma, uncomplicated: Secondary | ICD-10-CM

## 2023-07-12 MED ORDER — DUPIXENT 300 MG/2ML ~~LOC~~ SOAJ
300.0000 mg | SUBCUTANEOUS | 1 refills | Status: DC
  Filled 2023-07-19: qty 4, 28d supply, fill #0
  Filled 2023-08-11: qty 4, 28d supply, fill #1
  Filled 2023-09-05: qty 4, 28d supply, fill #2
  Filled 2023-10-11: qty 4, 28d supply, fill #3
  Filled 2023-11-06: qty 4, 28d supply, fill #4
  Filled 2023-12-07: qty 4, 28d supply, fill #5

## 2023-07-12 MED ORDER — AIRSUPRA 90-80 MCG/ACT IN AERO
2.0000 | INHALATION_SPRAY | RESPIRATORY_TRACT | 5 refills | Status: DC | PRN
Start: 2023-07-12 — End: 2023-11-07

## 2023-07-12 NOTE — Progress Notes (Signed)
Completed first dose of Dupixent in clinic (600mg  total dose) today with sample. Patient tolerated well. Performed injection in right and left lower abdomen. Will f/u with clinic if any issues with tolerability or perceived side effects  Please see Clinical Support note from 07/12/23  Chesley Mires, PharmD, MPH, BCPS, CPP Clinical Pharmacist (Rheumatology and Pulmonology)

## 2023-07-12 NOTE — Patient Instructions (Signed)
Your next DUPIXENT dose is due on 07/26/23, 08/09/23, and every 14 days thereafter. ONLY PEN PER DOSE  CONTINUE Wixela 2 puffs twice daily, Incruse 1 puff once daily, and montelukast 10mg  nightly  Your prescription will be shipped from [facility]. Their phone number is [phone]. Crystal Marquez will call you next week to schedule shipment to your home. They will mail your medication to your home.  You will need to be seen by your provider in 3 to 4 months to assess how DUPIXENT is working for you. Please ensure you keep follow-up appointment scheduled in January 2024. Call our clinic if you need to make this appointment.  How to manage an injection site reaction: Remember the 5 C's: COUNTER - leave on the counter at least 30 minutes but up to overnight to bring medication to room temperature. This may help prevent stinging COLD - place something cold (like an ice gel pack or cold water bottle) on the injection site just before cleansing with alcohol. This may help reduce pain CLARITIN - use Claritin (generic name is loratadine) for the first two weeks of treatment or the day of, the day before, and the day after injecting. This will help to minimize injection site reactions CORTISONE CREAM - apply if injection site is irritated and itching CALL ME - if injection site reaction is bigger than the size of your fist, looks infected, blisters, or if you develop hives

## 2023-07-12 NOTE — Progress Notes (Signed)
HPI Patient presents today to [facility] to see pharmacy team for Dupixent new start for severe persistent asthma.  Past medical history includes allergic rhinbitis, CAD, CHF, unstable angina, GERD, T2DM, history of colitis, osteoporosis, Sjogren's  Last Tezspire dose was 6 weeks ago and reports some worsening of breathing since then though she reports only minimal benefit from medication  Respiratory Medications Current regimen: Incruse 62. (1 pfuf once daily), Wixela 1 puff twice daily, montelukast 10mg  nightly Tried in past: Tezspire q 4 weeks Patient reports no known adherence challenges  OBJECTIVE Allergies  Allergen Reactions   Lisinopril Shortness Of Breath    Bloating     Outpatient Encounter Medications as of 07/12/2023  Medication Sig   Dupilumab (DUPIXENT) 300 MG/2ML SOAJ Inject 300 mg into the skin every 14 (fourteen) days. **loading dose completed in clinic on 07/12/23 w sample**   albuterol (PROVENTIL) (2.5 MG/3ML) 0.083% nebulizer solution Take 2.5 mg by nebulization every 6 (six) hours as needed for wheezing or shortness of breath.   Albuterol-Budesonide (AIRSUPRA) 90-80 MCG/ACT AERO TAKE 2 PUFFS BY MOUTH EVERY 4 HOURS AS NEEDED   ALPRAZolam (XANAX) 0.5 MG tablet Take 0.5 mg by mouth 3 (three) times daily as needed for anxiety.   Cholecalciferol (VITAMIN D3) 125 MCG (5000 UT) CAPS Take 5,000 Units by mouth daily.   DULoxetine (CYMBALTA) 60 MG capsule Take 120 mg by mouth daily.   ELIQUIS 5 MG TABS tablet TAKE 1 TABLET BY MOUTH TWICE A DAY   FARXIGA 10 MG TABS tablet Take 10 mg by mouth daily.   ferrous sulfate 325 (65 FE) MG tablet Take 325 mg by mouth daily with breakfast.   fexofenadine (ALLEGRA) 180 MG tablet Take 180 mg by mouth daily.   fluticasone-salmeterol (WIXELA INHUB) 500-50 MCG/ACT AEPB Inhale 1 puff into the lungs in the morning and at bedtime.   Folic Acid 0.8 MG CAPS Take 2 capsules by mouth daily.   hydroxychloroquine (PLAQUENIL) 200 MG  tablet Take 400 mg by mouth daily.   INCRUSE ELLIPTA 62.5 MCG/ACT AEPB TAKE 1 PUFF BY MOUTH EVERY DAY   ipratropium (ATROVENT) 0.03 % nasal spray Place 2 sprays into both nostrils 3 (three) times daily as needed for rhinitis.   lidocaine (LIDODERM) 5 % Place 1 patch onto the skin daily. Remove & Discard patch within 12 hours or as directed by MD   metFORMIN (GLUCOPHAGE) 500 MG tablet Take 500 mg by mouth 2 (two) times daily with a meal.   methocarbamol (ROBAXIN) 500 MG tablet TAKE 1 TABLET BY MOUTH FOUR TIMES A DAY   metoprolol succinate (TOPROL-XL) 25 MG 24 hr tablet Take 2 tablets (50 mg total) by mouth daily. Take with or immediately following a meal.   montelukast (SINGULAIR) 10 MG tablet Take 10 mg by mouth at bedtime.   NYSTATIN powder Apply 1 application  topically daily as needed (under breast/folds of skin).   pantoprazole (PROTONIX) 40 MG tablet Take 40 mg by mouth daily.   polyethylene glycol (MIRALAX / GLYCOLAX) 17 g packet Take 17 g by mouth daily as needed for mild constipation.   pregabalin (LYRICA) 150 MG capsule Take 150 mg by mouth 3 (three) times daily.   rosuvastatin (CRESTOR) 5 MG tablet Take 5 mg by mouth daily.   sacubitril-valsartan (ENTRESTO) 49-51 MG Take 1 tablet by mouth 2 (two) times daily.   spironolactone (ALDACTONE) 25 MG tablet Take 0.5 tablets (12.5 mg total) by mouth daily.   triamcinolone (KENALOG) 0.1 % paste Use  as directed 1 Application in the mouth or throat 2 (two) times daily.   [DISCONTINUED] benzonatate (TESSALON) 200 MG capsule TAKE 1 CAPSULE BY MOUTH THREE TIMES A DAY AS NEEDED (Patient taking differently: Take 200 mg by mouth 3 (three) times daily as needed for cough.)   [DISCONTINUED] levocetirizine (XYZAL) 5 MG tablet Take 1 tablet (5 mg total) by mouth every evening.   [DISCONTINUED] predniSONE (DELTASONE) 10 MG tablet 40mg  x 3 days, 30mg  x3 days, 20mg  x3 days, 10mg  x3 days   [DISCONTINUED] predniSONE (DELTASONE) 10 MG tablet Take 4 tablets (40 mg  total) by mouth daily with breakfast for 5 days, THEN 2 tablets (20 mg total) daily with breakfast for 5 days, THEN 1 tablet (10 mg total) daily with breakfast for 5 days.   No facility-administered encounter medications on file as of 07/12/2023.     Immunization History  Administered Date(s) Administered   Influenza,inj,Quad PF,6+ Mos 10/31/2022   Influenza,inj,quad, With Preservative 06/19/2017   Influenza-Unspecified 06/28/2021   PFIZER(Purple Top)SARS-COV-2 Vaccination 12/19/2019, 01/13/2020     PFTs    Latest Ref Rng & Units 08/15/2022    3:48 PM  PFT Results  FVC-Pre L 2.18   FVC-Predicted Pre % 79   FVC-Post L 2.30   FVC-Predicted Post % 83   Pre FEV1/FVC % % 85   Post FEV1/FCV % % 88   FEV1-Pre L 1.86   FEV1-Predicted Pre % 87   FEV1-Post L 2.02   DLCO uncorrected ml/min/mmHg 19.17   DLCO UNC% % 112   DLCO corrected ml/min/mmHg 18.06   DLCO COR %Predicted % 106   DLVA Predicted % 121   TLC L 3.98   TLC % Predicted % 92   RV % Predicted % 43     Eosinophils Most recent blood eosinophil count was 200 cells/microL taken on 10/20/2022.   IgE: 18 on 03/25/2022  Assessment   Biologics training for dupilumab (Dupixent)  Goals of therapy: Mechanism: human monoclonal IgG4 antibody that inhibits interleukin-4 and interleukin-13 cytokine-induced responses, including release of proinflammatory cytokines, chemokines, and IgE Reviewed that Dupixent is add-on medication and patient must continue maintenance inhaler regimen. Response to therapy: may take 4 months to determine efficacy. Discussed that patients generally feel improvement sooner than 4 months.  Side effects: injection site reaction (6-18%), antibody development (5-16%), ophthalmic conjunctivitis (2-16%), transient blood eosinophilia (1-2%)  Dose: 600mg  at Week 0 (administered today in clinic) followed by 300mg  every 14 days thereafter  Administration/Storage:  Reviewed administration sites of thigh or abdomen  (at least 2-3 inches away from abdomen). Reviewed the upper arm is only appropriate if caregiver is administering injection  Do not shake pen/syringe as this could lead to product foaming or precipitation. Do not use if solution is discolored or contains particulate matter or if window on prefilled pen is yellow (indicates pen has been used).  Reviewed storage of medication in refrigerator. Reviewed that Dupixent can be stored at room temperature in unopened carton for up to 14 days.  Access: Approval of Dupixent through: insurance Patient enrolled into copay card program to help with copay assistance.  Patient self-administered Dupixent 300mg /67ml x 2 (total dose 600mg ) in right lower abdomen and left lower abdomen using sample Dupixent 300mg /89mL autoinjector pen NDC: [phone] Lot: [id number] Expiration: 01/16/2025  Patient monitored for 30 minutes for adverse reaction.  Patient tolerated well. Reports that Dupixent injection is slightly more uncomfortable than Tezspire injections but tolerable.  Injection site noted. Patient denies itchiness and irritation at injection.,  No swelling or redness noted., and Reviewed injection site reaction management with patient verbally and printed information for review in AVS  Medication Reconciliation  A drug regimen assessment was performed, including review of allergies, interactions, disease-state management, dosing and immunization history. Medications were reviewed with the patient, including name, instructions, indication, goals of therapy, potential side effects, importance of adherence, and safe use.  Drug interaction(s): none noted  PLAN Continue Dupixent 300mg  every 14 days.  Next dose is due 07/26/2023 and every 14 days thereafter. Rx sent to: [facility]: [phone] .  Patient provided with pharmacy phone number and advised that Mills Koller will call next week to schedule shipment to home Continue maintenance asthma regimen  of: Incruse 62. (1 pfuf once daily), Wixela 1 puff twice daily, montelukast 10mg  nightly  All questions encouraged and answered.  Instructed patient to reach out with any further questions or concerns.  Thank you for allowing pharmacy to participate in this patient's care.  This appointment required 45 minutes of patient care (this includes precharting, chart review, review of results, face-to-face care, etc.).   Chesley Mires, PharmD, MPH, BCPS, CPP Clinical Pharmacist (Rheumatology and Pulmonology)

## 2023-07-12 NOTE — Telephone Encounter (Signed)
Looks like she sent the same message to Cardiology and they are trying to get her in for a EKG. Also, she did see Devki today for a new start on Dupixent.

## 2023-07-13 ENCOUNTER — Ambulatory Visit: Payer: Managed Care, Other (non HMO) | Attending: Cardiology

## 2023-07-13 VITALS — BP 148/80 | HR 70 | Resp 20 | Ht 59.0 in | Wt 186.6 lb

## 2023-07-13 DIAGNOSIS — I480 Paroxysmal atrial fibrillation: Secondary | ICD-10-CM

## 2023-07-13 IMAGING — CR DG CHEST 2V
2 series · 2 of 2 positions shown · non-contrast
Comparison: September 08, 2021

CLINICAL DATA: Productive cough, shortness of breath and wheezing
since diagnosed with COVID in June 2021.

EXAM:
CHEST - 2 VIEW

[chest pa]
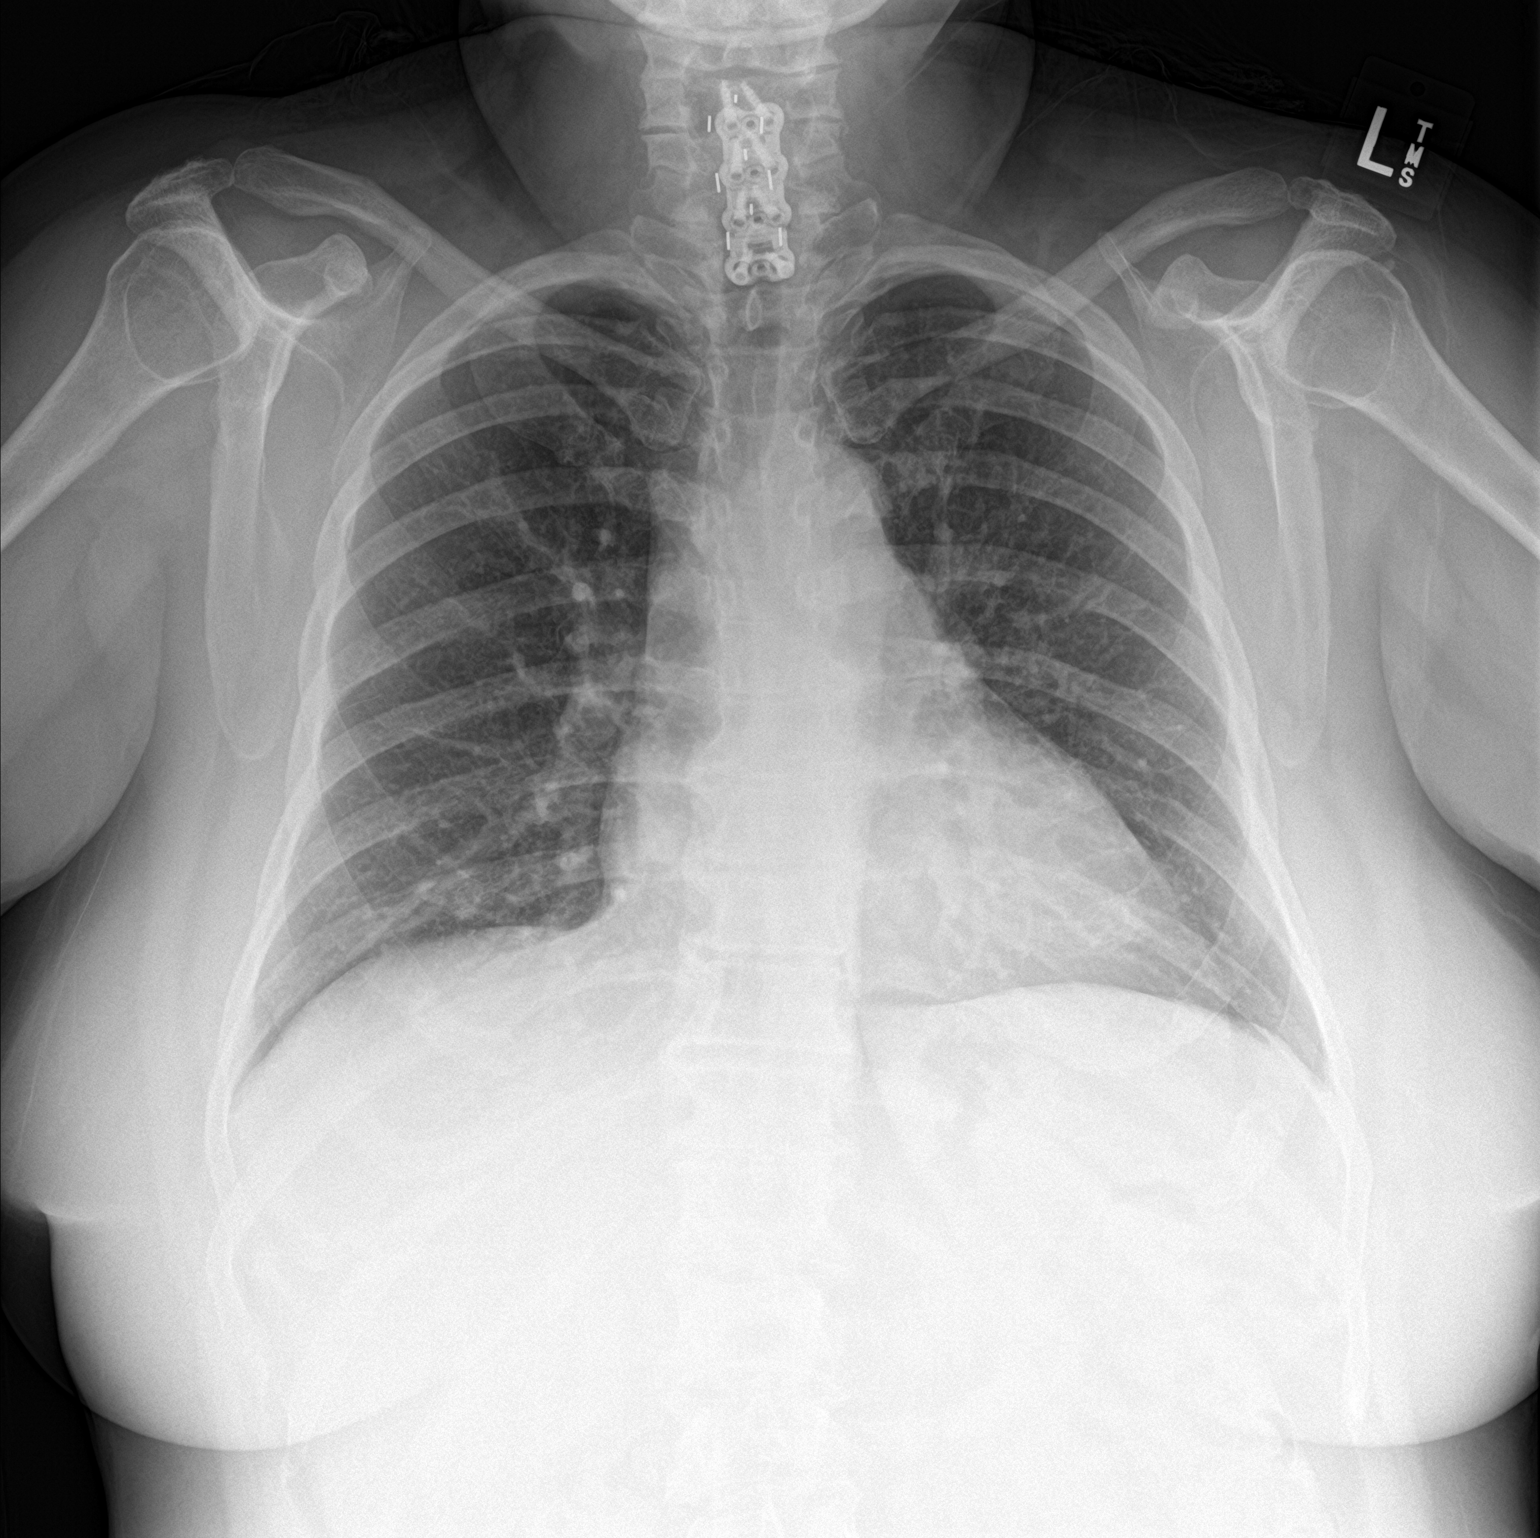

[chest lat]
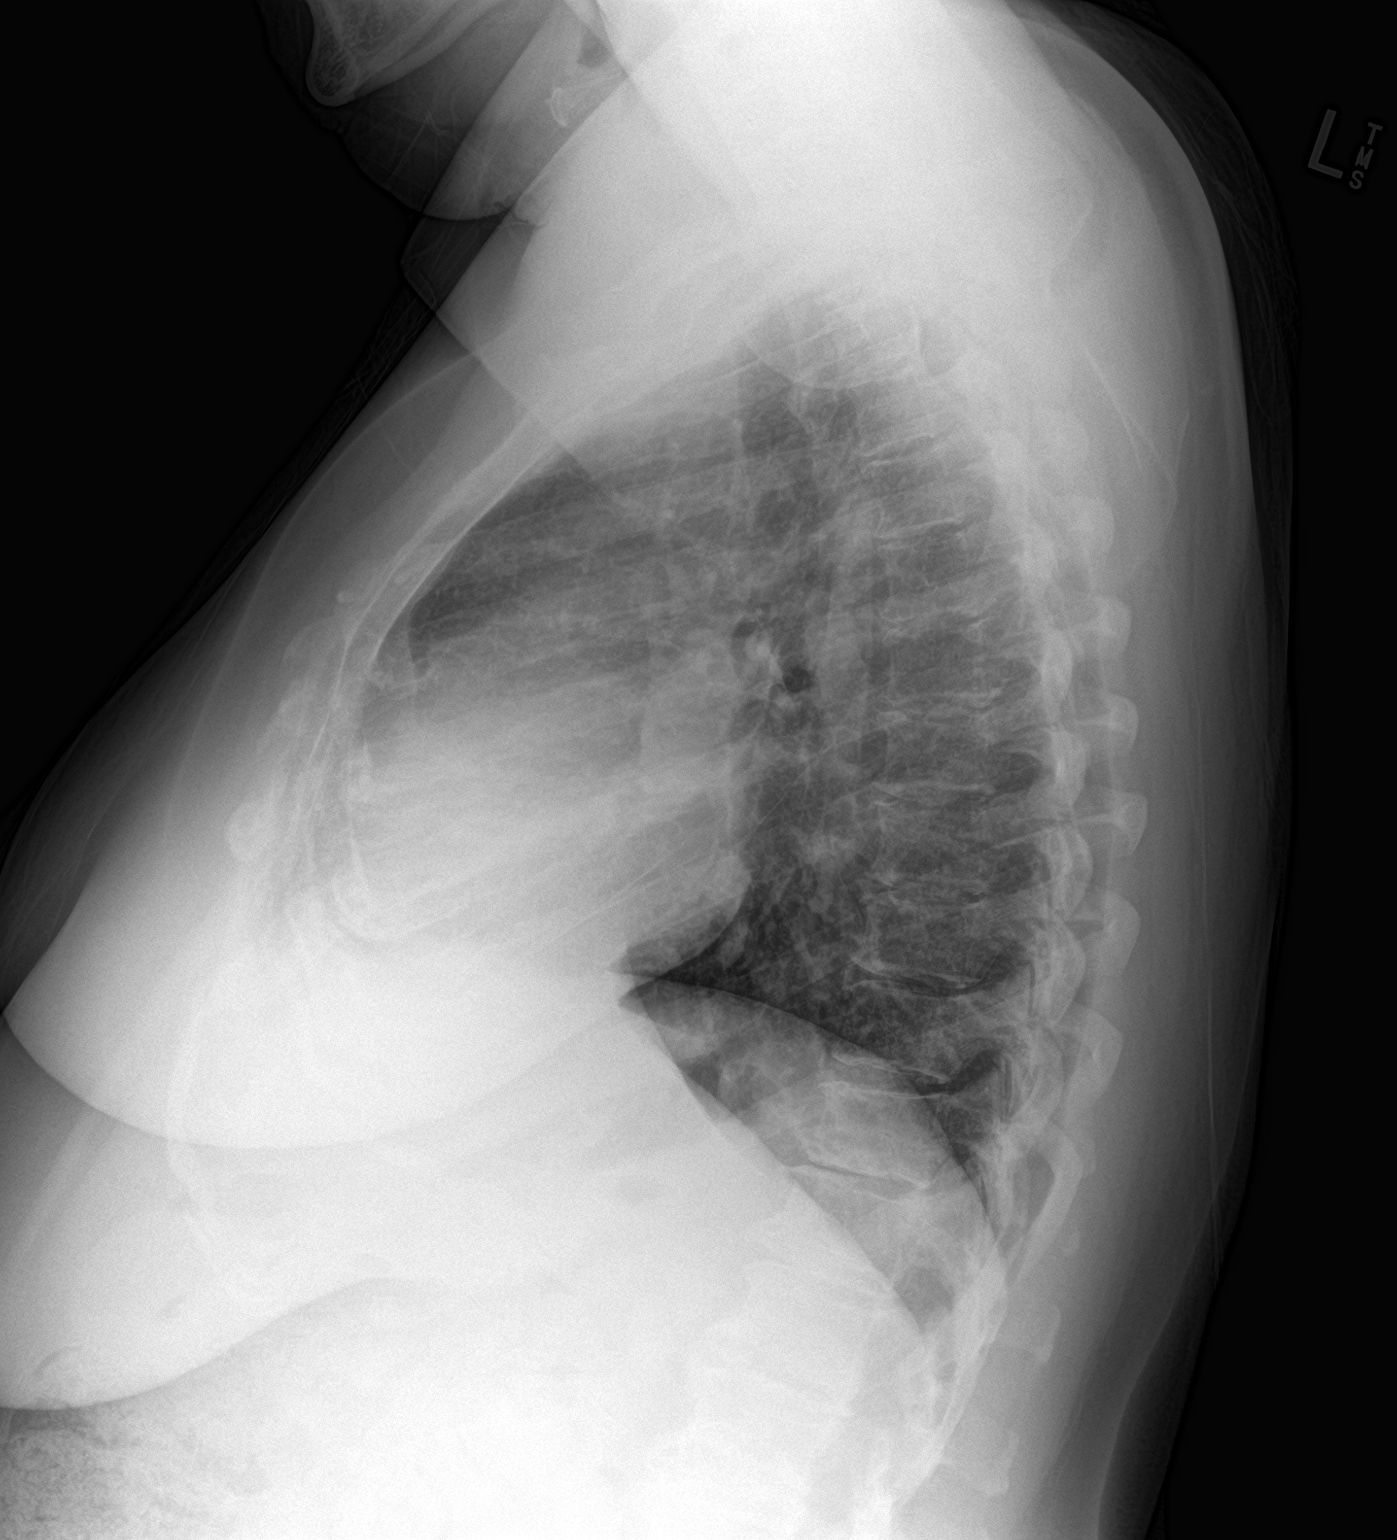

[2 of 2 positions shown; findings below may reference images not displayed]

FINDINGS: The heart size and mediastinal contours are within normal limits.
Both lungs are clear. Mild breast soft tissue attenuation is seen
overlying the bilateral lung bases. Radiopaque fusion plate and
screws are seen overlying the lower cervical spine. The visualized
skeletal structures are otherwise unremarkable.
IMPRESSION: No active cardiopulmonary disease.

## 2023-07-13 NOTE — Progress Notes (Signed)
Nurse Visit   Date of Encounter: 07/13/2023 ID: Crystal Marquez, DOB 1960-09-20, MRN [id number]  PCP:  Erskine Emery, NP   [facility] Providers Cardiologist:  Bing Matter   Visit Details   VS:  BP (!) 148/80 (BP Location: Left Arm, Patient Position: Sitting, Cuff Size: Normal)   Pulse 70   Resp 20   Ht 4\' 11"  (1.499 m)   Wt 186 lb 9.6 oz (84.6 kg)   LMP  (LMP Unknown)   SpO2 97%   BMI 37.69 kg/m  , BMI Body mass index is 37.69 kg/m.  Wt Readings from Last 3 Encounters:  07/13/23 186 lb 9.6 oz (84.6 kg)  06/28/23 186 lb 12.8 oz (84.7 kg)  03/28/23 182 lb 14.4 oz (83 kg)     Reason for visit: EKG Performed today: Vitals, EKG, Provider consulted:Krasowski, and Education Changes (medications, testing, etc.) : None Length of Visit: 15 minutes    Medications Adjustments/Labs and Tests Ordered: Orders Placed This Encounter  Procedures   EKG 12-Lead   No orders of the defined types were placed in this encounter.    Signed, Eleonore Chiquito, RN  07/13/2023 9:24 AM

## 2023-07-17 ENCOUNTER — Other Ambulatory Visit: Payer: Self-pay | Admitting: Pulmonary Disease

## 2023-07-17 MED ORDER — PREDNISONE 20 MG PO TABS: ORAL_TABLET | ORAL | 0 refills | Status: AC

## 2023-07-17 MED ORDER — AZITHROMYCIN 250 MG PO TABS: ORAL_TABLET | ORAL | 0 refills | Status: AC

## 2023-07-19 ENCOUNTER — Other Ambulatory Visit: Payer: Self-pay

## 2023-07-19 ENCOUNTER — Other Ambulatory Visit (HOSPITAL_COMMUNITY): Payer: Self-pay

## 2023-07-19 NOTE — Progress Notes (Signed)
Specialty Pharmacy Initial Fill Coordination Note  Crystal Marquez is a 62 y.o. female contacted today regarding refills of specialty medication(s) Dupilumab   Patient requested Delivery   Delivery date: 07/25/23   Verified address: [address]   [place] [address]   Medication will be filled on 07/24/23.   Patient is aware of $0.00 copayment.

## 2023-07-24 ENCOUNTER — Other Ambulatory Visit: Payer: Self-pay

## 2023-07-24 ENCOUNTER — Other Ambulatory Visit: Payer: Self-pay | Admitting: Cardiology

## 2023-07-31 DIAGNOSIS — D513 Other dietary vitamin B12 deficiency anemia: Secondary | ICD-10-CM | POA: Insufficient documentation

## 2023-07-31 DIAGNOSIS — E559 Vitamin D deficiency, unspecified: Secondary | ICD-10-CM | POA: Insufficient documentation

## 2023-08-07 DIAGNOSIS — D72829 Elevated white blood cell count, unspecified: Secondary | ICD-10-CM | POA: Insufficient documentation

## 2023-08-08 ENCOUNTER — Ambulatory Visit: Payer: Managed Care, Other (non HMO) | Attending: Cardiology | Admitting: Cardiology

## 2023-08-08 ENCOUNTER — Ambulatory Visit: Payer: Managed Care, Other (non HMO)

## 2023-08-08 ENCOUNTER — Encounter: Payer: Self-pay | Admitting: Cardiology

## 2023-08-08 VITALS — BP 138/80 | HR 81 | Ht 59.0 in | Wt 188.8 lb

## 2023-08-08 DIAGNOSIS — I10 Essential (primary) hypertension: Secondary | ICD-10-CM | POA: Diagnosis not present

## 2023-08-08 DIAGNOSIS — I420 Dilated cardiomyopathy: Secondary | ICD-10-CM | POA: Diagnosis not present

## 2023-08-08 DIAGNOSIS — I2510 Atherosclerotic heart disease of native coronary artery without angina pectoris: Secondary | ICD-10-CM | POA: Diagnosis not present

## 2023-08-08 DIAGNOSIS — R002 Palpitations: Secondary | ICD-10-CM

## 2023-08-08 DIAGNOSIS — E119 Type 2 diabetes mellitus without complications: Secondary | ICD-10-CM

## 2023-08-08 DIAGNOSIS — R0609 Other forms of dyspnea: Secondary | ICD-10-CM

## 2023-08-08 DIAGNOSIS — E782 Mixed hyperlipidemia: Secondary | ICD-10-CM

## 2023-08-08 NOTE — Patient Instructions (Addendum)
Medication Instructions:  Your physician recommends that you continue on your current medications as directed. Please refer to the Current Medication list given to you today.  *If you need a refill on your cardiac medications before your next appointment, please call your pharmacy*   Lab Work: None Ordered If you have labs (blood work) drawn today and your tests are completely normal, you will receive your results only by: MyChart Message (if you have MyChart) OR A paper copy in the mail If you have any lab test that is abnormal or we need to change your treatment, we will call you to review the results.   Testing/Procedures:  WHY IS MY DOCTOR PRESCRIBING ZIO? The Zio system is proven and trusted by physicians to detect and diagnose irregular heart rhythms -- and has been prescribed to hundreds of thousands of patients.  The FDA has cleared the Zio system to monitor for many different kinds of irregular heart rhythms. In a study, physicians were able to reach a diagnosis 90% of the time with the Zio system1.  You can wear the Zio monitor -- a small, discreet, comfortable patch -- during your normal day-to-day activity, including while you sleep, shower, and exercise, while it records every single heartbeat for analysis.  1Barrett, P., et al. Comparison of 24 Hour Holter Monitoring Versus 14 Day Novel Adhesive Patch Electrocardiographic Monitoring. American Journal of Medicine, 2014.  ZIO VS. HOLTER MONITORING The Zio monitor can be comfortably worn for up to 14 days. Holter monitors can be worn for 24 to 48 hours, limiting the time to record any irregular heart rhythms you may have. Zio is able to capture data for the 51% of patients who have their first symptom-triggered arrhythmia after 48 hours.1  LIVE WITHOUT RESTRICTIONS The Zio ambulatory cardiac monitor is a small, unobtrusive, and water-resistant patch--you might even forget you're wearing it. The Zio monitor records and stores  every beat of your heart, whether you're sleeping, working out, or showering.     Follow-Up: At [facility], you and your health needs are our priority.  As part of our continuing mission to provide you with exceptional heart care, we have created designated Provider Care Teams.  These Care Teams include your primary Cardiologist (physician) and Advanced Practice Providers (APPs -  Physician Assistants and Nurse Practitioners) who all work together to provide you with the care you need, when you need it.  We recommend signing up for the patient portal called "MyChart".  Sign up information is provided on this After Visit Summary.  MyChart is used to connect with patients for Virtual Visits (Telemedicine).  Patients are able to view lab/test results, encounter notes, upcoming appointments, etc.  Non-urgent messages can be sent to your provider as well.   To learn more about what you can do with MyChart, go to [email or web address].com.    Your next appointment:   6 month(s)  The format for your next appointment:   In Person  Provider:   Robert Krasowski, MD    Other Instructions NA  

## 2023-08-08 NOTE — Progress Notes (Signed)
Cardiology Office Note:    Date:  08/08/2023   ID:  Hunter Abdulmalik, DOB 08-05-1961, MRN [id number]  PCP:  Erskine Emery, NP  Cardiologist:  Gypsy Balsam, MD    Referring MD: Erskine Emery, NP   Chief Complaint  Patient presents with   Follow-up    History of Present Illness:    Crystal Marquez is a 62 y.o. female with past medical history significant for essential hypertension, paroxysmal atrial fibrillation, cardiomyopathy with ejection fraction initially 30% now with put on guideline directed medical therapy with improvement and normalization based on last echocardiogram, coronary CT angio showed up to 25% stenosis clearly not explaining her cardiomyopathy. Comes today to months for follow-up overall doing well but complain of having some palpitations sometimes she felt no sustained arrhythmia lasting for up to 20 minutes denies have any chest pain tightness squeezing pressure and chest overall seems to be doing well  Past Medical History:  Diagnosis Date   Age-related osteoporosis without current pathological fracture    Allergic rhinitis    Anxiety    Asthma    B12 deficiency 05/06/2020   Benign essential HTN 08/29/2019   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   Chronic kidney disease    Chronic pain syndrome    Class 2 obesity due to excess calories without serious comorbidity with body mass index (BMI) of 36.0 to 36.9 in adult 08/29/2019   Colitis 05/06/2020   Colon polyp    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HCC) 05/06/2020   Coronary atherosclerosis 10/06/2021   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   Dyslipidemia 08/29/2019   Dyspnea on exertion 10/06/2021   Fibromyalgia 09/30/2021   GERD (gastroesophageal reflux disease)    Hypercalcemia    Hypertension    Iron deficiency anemia due to chronic blood loss 05/06/2020   Low back pain 11/11/2021   Major depressive disorder, single episode    Mild intermittent asthma without complication 07/29/2020   Myopathy     Neck pain 11/11/2021   Neuropathy    Pain in right hand 08/09/2018   Polyarthralgia 09/30/2021   Reflux esophagitis    Sjogren syndrome, unspecified (HCC) 09/30/2021   Sjogren's disease (HCC)    Spinal stenosis of lumbar region 12/13/2017   Tenosynovitis of left hand 08/09/2018   Type 2 diabetes mellitus without complication, without long-term current use of insulin (HCC) 08/29/2019   Unilateral primary osteoarthritis, right knee 11/03/2021    Past Surgical History:  Procedure Laterality Date   ABDOMINAL HYSTERECTOMY     CARPAL TUNNEL RELEASE Left 2018   CERVICAL FUSION  10/07/2019   Anterior Cervical Discectomy due to spinal stenosis   CESAREAN SECTION     COLONOSCOPY     UPPER GASTROINTESTINAL ENDOSCOPY      Current Medications: Current Meds  Medication Sig   albuterol (PROVENTIL) (2.5 MG/3ML) 0.083% nebulizer solution Take 2.5 mg by nebulization every 6 (six) hours as needed for wheezing or shortness of breath.   Albuterol-Budesonide (AIRSUPRA) 90-80 MCG/ACT AERO Inhale 2 puffs into the lungs every 4 (four) hours as needed. TAKE 2 PUFFS BY MOUTH EVERY 4 HOURS AS NEEDED (Patient taking differently: Inhale 2 puffs into the lungs every 4 (four) hours as needed (sob). TAKE 2 PUFFS BY MOUTH EVERY 4 HOURS AS NEEDED)   ALPRAZolam (XANAX) 0.5 MG tablet Take 0.5 mg by mouth 3 (three) times daily as needed for anxiety.   Cholecalciferol (VITAMIN D3) 125 MCG (5000 UT) CAPS Take 5,000 Units by mouth daily.  DULoxetine (CYMBALTA) 60 MG capsule Take 120 mg by mouth daily.   Dupilumab (DUPIXENT) 300 MG/2ML SOAJ Inject 300 mg into the skin every 14 (fourteen) days. **loading dose completed in clinic on 07/12/23 w sample**   ELIQUIS 5 MG TABS tablet TAKE 1 TABLET BY MOUTH TWICE A DAY   FARXIGA 10 MG TABS tablet Take 10 mg by mouth daily.   ferrous sulfate 325 (65 FE) MG tablet Take 325 mg by mouth daily with breakfast.   fexofenadine (ALLEGRA) 180 MG tablet Take 180 mg by mouth daily.    fluticasone-salmeterol (WIXELA INHUB) 500-50 MCG/ACT AEPB Inhale 1 puff into the lungs in the morning and at bedtime.   Folic Acid 0.8 MG CAPS Take 2 capsules by mouth daily.   hydroxychloroquine (PLAQUENIL) 200 MG tablet Take 400 mg by mouth daily.   INCRUSE ELLIPTA 62.5 MCG/ACT AEPB TAKE 1 PUFF BY MOUTH EVERY DAY (Patient taking differently: Inhale 1 puff into the lungs daily.)   ipratropium (ATROVENT) 0.03 % nasal spray Place 2 sprays into both nostrils 3 (three) times daily as needed for rhinitis.   lidocaine (LIDODERM) 5 % Place 1 patch onto the skin daily. Remove & Discard patch within 12 hours or as directed by MD   metFORMIN (GLUCOPHAGE) 500 MG tablet Take 500 mg by mouth 2 (two) times daily with a meal.   methocarbamol (ROBAXIN) 500 MG tablet TAKE 1 TABLET BY MOUTH FOUR TIMES A DAY   metoprolol succinate (TOPROL-XL) 25 MG 24 hr tablet Take 2 tablets (50 mg total) by mouth daily. Take with or immediately following a meal.   montelukast (SINGULAIR) 10 MG tablet Take 10 mg by mouth at bedtime.   NYSTATIN powder Apply 1 application  topically daily as needed (under breast/folds of skin).   pantoprazole (PROTONIX) 40 MG tablet Take 40 mg by mouth daily.   polyethylene glycol (MIRALAX / GLYCOLAX) 17 g packet Take 17 g by mouth daily as needed for mild constipation.   pregabalin (LYRICA) 150 MG capsule Take 150 mg by mouth 3 (three) times daily.   rosuvastatin (CRESTOR) 5 MG tablet Take 5 mg by mouth daily.   sacubitril-valsartan (ENTRESTO) 49-51 MG Take 1 tablet by mouth 2 (two) times daily.   spironolactone (ALDACTONE) 25 MG tablet Take 0.5 tablets (12.5 mg total) by mouth daily.   triamcinolone (KENALOG) 0.1 % paste Use as directed 1 Application in the mouth or throat 2 (two) times daily.     Allergies:   Lisinopril   Social History   Socioeconomic History   Marital status: Married    Spouse name: Not on file   Number of children: Not on file   Years of education: Not on file    Highest education level: Not on file  Occupational History   Not on file  Tobacco Use   Smoking status: Never   Smokeless tobacco: Never  Vaping Use   Vaping status: Never Used  Substance and Sexual Activity   Alcohol use: No    Alcohol/week: 0.0 standard drinks of alcohol   Drug use: No   Sexual activity: Not on file  Other Topics Concern   Not on file  Social History Narrative   Not on file   Social Determinants of Health   Financial Resource Strain: Not on file  Food Insecurity: No Food Insecurity (07/20/2022)   Hunger Vital Sign    Worried About Running Out of Food in the Last Year: Never true    Ran Out of Food  in the Last Year: Never true  Transportation Needs: No Transportation Needs (07/20/2022)   PRAPARE - Administrator, Civil Service (Medical): No    Lack of Transportation (Non-Medical): No  Physical Activity: Not on file  Stress: Not on file  Social Connections: Not on file     Family History: The patient's family history includes Asthma in her mother; COPD in her mother; Colon polyps in her father and sister; Crohn's disease in her sister; Depression in her father; Diabetes in her father and mother; Diverticulitis in her sister; Heart disease in her father, maternal grandmother, and paternal grandmother; Hypercholesterolemia in her sister; Hypertension in her mother; Kidney disease in her brother and mother; Skin cancer in her father. There is no history of Colon cancer, Rectal cancer, or Stomach cancer. ROS:   Please see the history of present illness.    All 14 point review of systems negative except as described per history of present illness  EKGs/Labs/Other Studies Reviewed:         Recent Labs: 10/20/2022: B Natriuretic Peptide 104.2; Hemoglobin 14.6; Platelets 163 02/07/2023: BUN 10; Creatinine, Ser 0.94; NT-Pro BNP 331; Potassium 4.5; Sodium 141  Recent Lipid Panel    Component Value Date/Time   CHOL 122 07/20/2022 0459   TRIG 70  07/20/2022 0459   HDL 61 07/20/2022 0459   CHOLHDL 2.0 07/20/2022 0459   VLDL 14 07/20/2022 0459   LDLCALC 47 07/20/2022 0459    Physical Exam:    VS:  BP 138/80 (BP Location: Left Arm, Patient Position: Sitting)   Pulse 81   Ht 4\' 11"  (1.499 m)   Wt 188 lb 12.8 oz (85.6 kg)   LMP  (LMP Unknown)   SpO2 94%   BMI 38.13 kg/m     Wt Readings from Last 3 Encounters:  08/08/23 188 lb 12.8 oz (85.6 kg)  07/13/23 186 lb 9.6 oz (84.6 kg)  06/28/23 186 lb 12.8 oz (84.7 kg)     GEN:  Well nourished, well developed in no acute distress HEENT: Normal NECK: No JVD; No carotid bruits LYMPHATICS: No lymphadenopathy CARDIAC: RRR, no murmurs, no rubs, no gallops RESPIRATORY:  Clear to auscultation without rales, wheezing or rhonchi  ABDOMEN: Soft, non-tender, non-distended MUSCULOSKELETAL:  No edema; No deformity  SKIN: Warm and dry LOWER EXTREMITIES: no swelling NEUROLOGIC:  Alert and oriented x 3 PSYCHIATRIC:  Normal affect   ASSESSMENT:    1. Dilated cardiomyopathy (HCC) ejection fraction percent based on done in November 2 023   2. Coronary artery disease involving native coronary artery of native heart without angina pectoris   3. Benign essential HTN   4. Type 2 diabetes mellitus without complication, without long-term current use of insulin (HCC)   5. Dyspnea on exertion   6. Mixed dyslipidemia    PLAN:    In order of problems listed above:  History of dilated cardiomyopathy on guideline directed medical therapy with normalization we will continue. Coronary disease only minimal based on coronary CT angio continue monitoring. Benign essential hypertension blood pressure well-controlled continue present management. Dyslipidemia I did review K PN LDL 47 HDL 61.  Continue present management.   Medication Adjustments/Labs and Tests Ordered: Current medicines are reviewed at length with the patient today.  Concerns regarding medicines are outlined above.  No orders of the  defined types were placed in this encounter.  Medication changes: No orders of the defined types were placed in this encounter.   Signed, Georgeanna Lea, MD,  [facility] 08/08/2023 1:55 PM    [facility]

## 2023-08-08 NOTE — Addendum Note (Signed)
Addended by: Baldo Ash D on: 08/08/2023 02:15 PM   Modules accepted: Orders

## 2023-08-11 ENCOUNTER — Other Ambulatory Visit: Payer: Self-pay

## 2023-08-11 NOTE — Progress Notes (Signed)
Specialty Pharmacy Refill Coordination Note  Crystal Marquez is a 62 y.o. female contacted today regarding refills of specialty medication(s) No data recorded  Patient requested Delivery   Delivery date: 08/15/23   Verified address: [address] [place], [place] [address]   Medication will be filled on 08/14/23.

## 2023-08-14 ENCOUNTER — Other Ambulatory Visit (HOSPITAL_COMMUNITY): Payer: Self-pay

## 2023-08-18 ENCOUNTER — Encounter: Payer: Self-pay | Admitting: Cardiology

## 2023-08-22 ENCOUNTER — Other Ambulatory Visit: Payer: Self-pay | Admitting: Cardiology

## 2023-08-22 ENCOUNTER — Telehealth: Payer: Self-pay

## 2023-08-22 DIAGNOSIS — I10 Essential (primary) hypertension: Secondary | ICD-10-CM

## 2023-08-22 DIAGNOSIS — R0609 Other forms of dyspnea: Secondary | ICD-10-CM

## 2023-08-22 MED ORDER — FUROSEMIDE 20 MG PO TABS: 20.0000 mg | ORAL_TABLET | Freq: Every day | ORAL | 3 refills | Status: DC

## 2023-08-22 NOTE — Telephone Encounter (Signed)
My Chart message sent to pt per Dr. Vanetta Shawl note to add Lasix 20mg  daily and come for ProBNP and BMP in 3-4 days.

## 2023-08-23 IMAGING — MR MR CERVICAL SPINE W/O CM
5 series · 28 of 48 positions shown · non-contrast
Comparison: MRI from 08/02/2019.

CLINICAL DATA: Initial evaluation for chronic neck pain with
radiation into the right upper extremity.

EXAM:
MRI CERVICAL SPINE WITHOUT CONTRAST
TECHNIQUE: Multiplanar, multisequence MR imaging of the cervical spine was
performed. No intravenous contrast was administered.

[Series 5: t2_tse_warp_sag · sagittal · 3.0mm · 0.45mm/px · 6 of 15 slices shown]
[im 1/15]
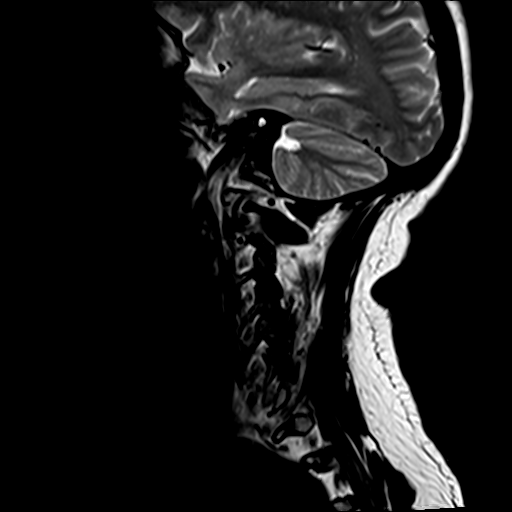
[im 3/15]
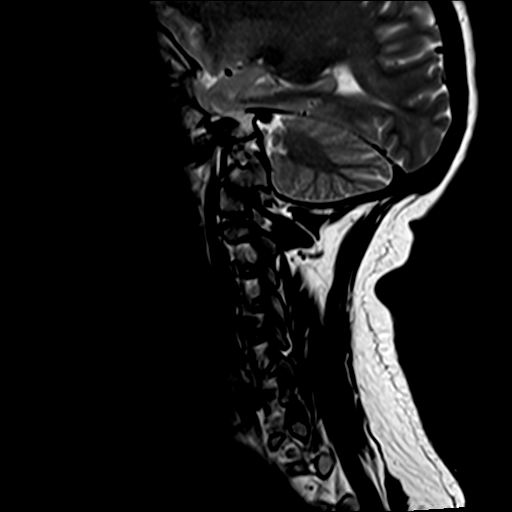
[im 6/15]
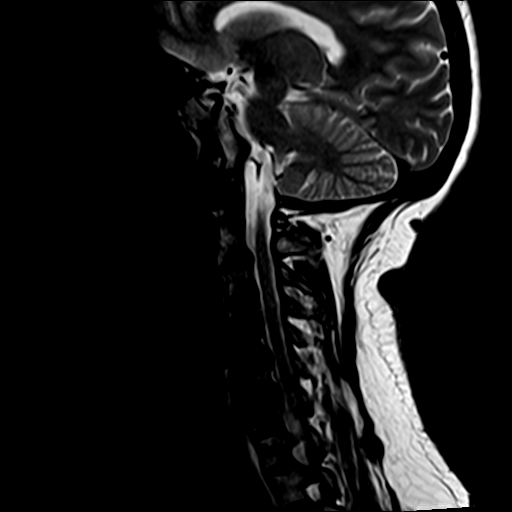
[im 9/15]
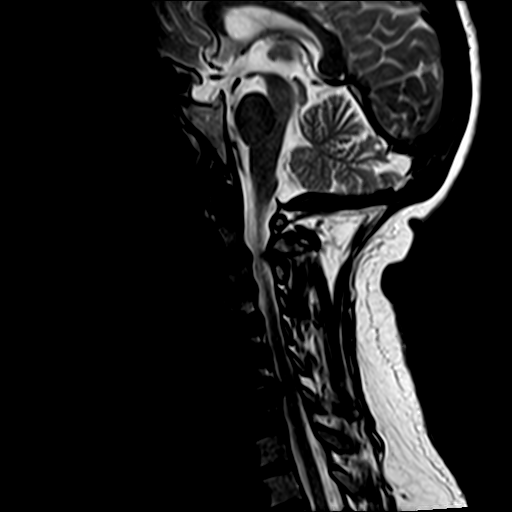
[im 12/15]
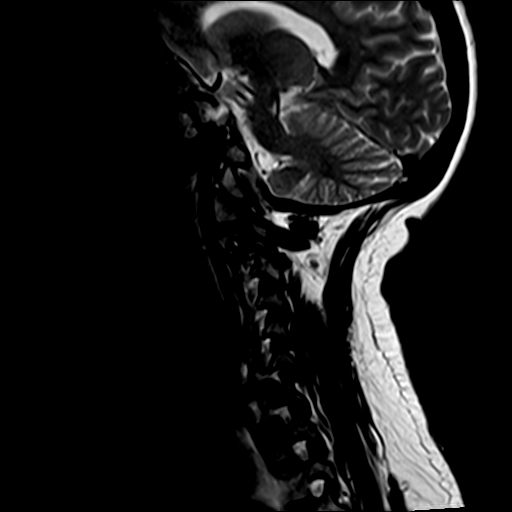
[im 15/15]
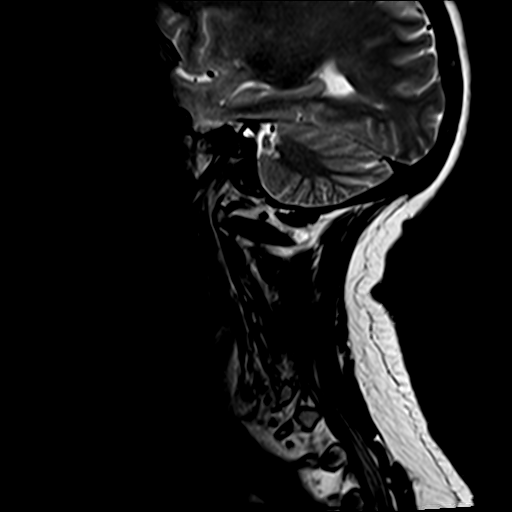

[Series 6: t1_tse_warp_sag · sagittal · 3.0mm · 0.45mm/px · 6 of 15 slices shown]
[im 1/15]
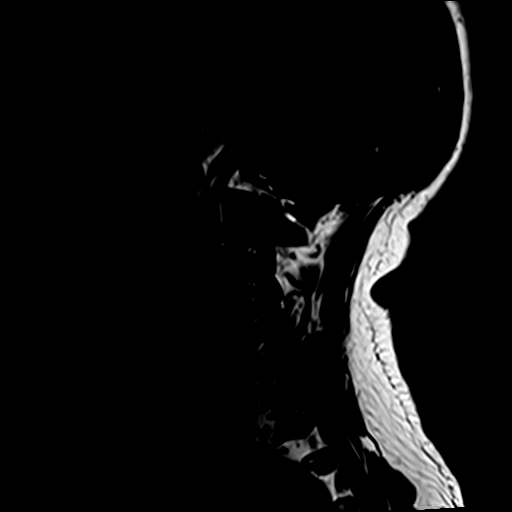
[im 3/15]
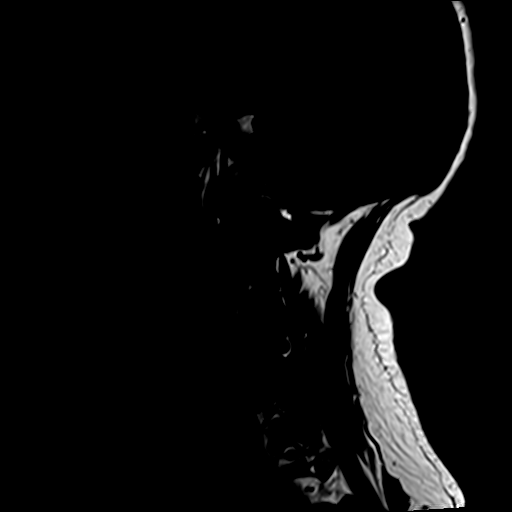
[im 6/15]
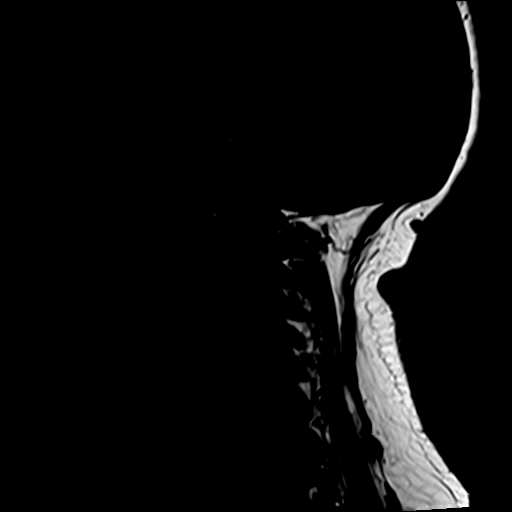
[im 9/15]
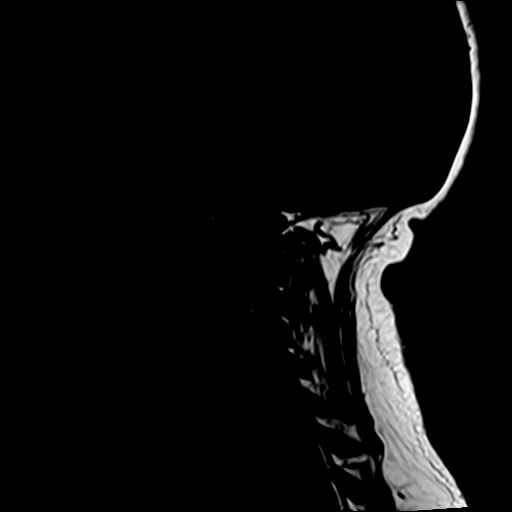
[im 12/15]
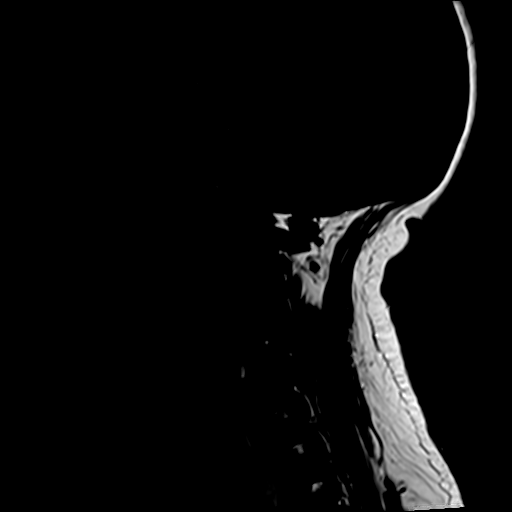
[im 15/15]
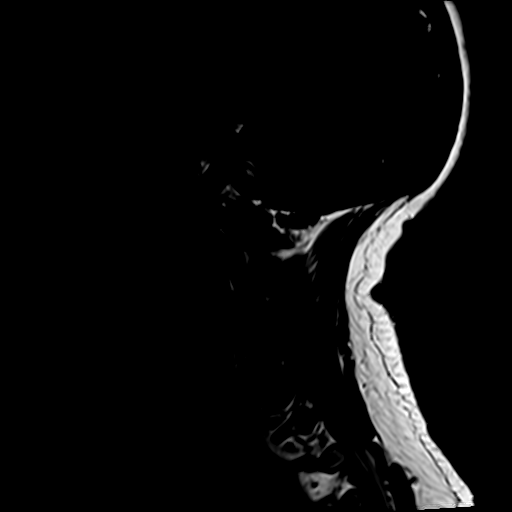

[Series 7: t2_tse_stir_warp_sag · sagittal · 3.0mm · 0.90mm/px · 6 of 15 slices shown]
[im 1/15]
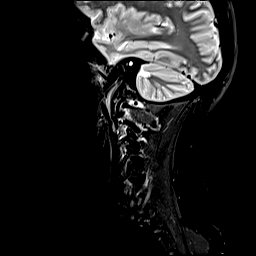
[im 3/15]
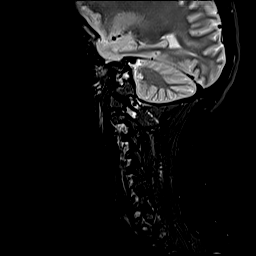
[im 6/15]
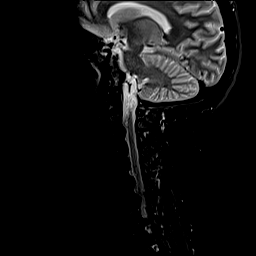
[im 9/15]
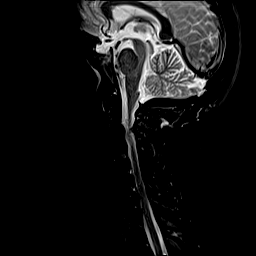
[im 12/15]
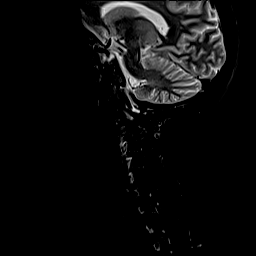
[im 15/15]
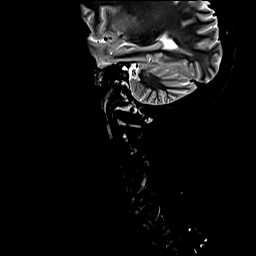

[Series 8: t2_tse_warp_tra · axial · 3.0mm · 0.78mm/px · z∈[-80,+41]mm · 9 of 40 slices shown]
[im 1/40]
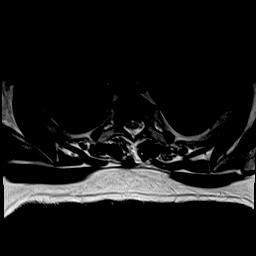
[im 6/40]
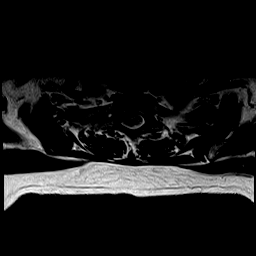
[im 12/40]
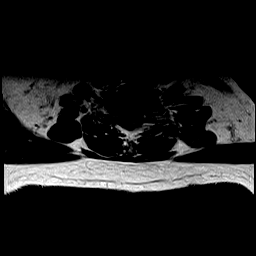
[im 17/40]
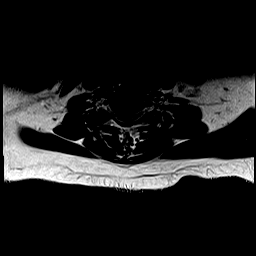
[im 20/40]
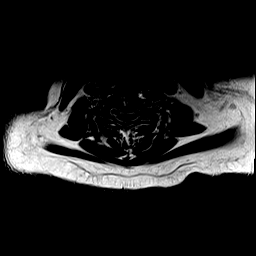
[im 23/40]
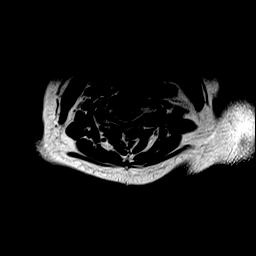
[im 28/40]
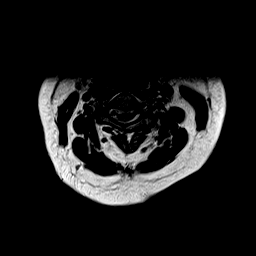
[im 34/40]
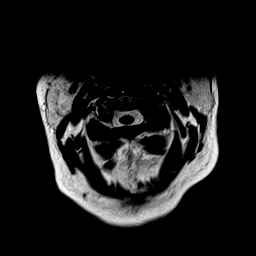
[im 40/40]
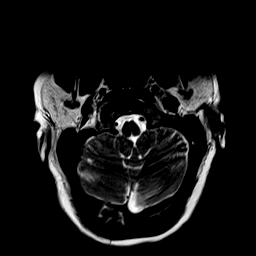

[Series 9: t1_tse_warp_tra · axial · 3.0mm · 0.39mm/px · 1 of 40 slices shown]
[im 1/40]
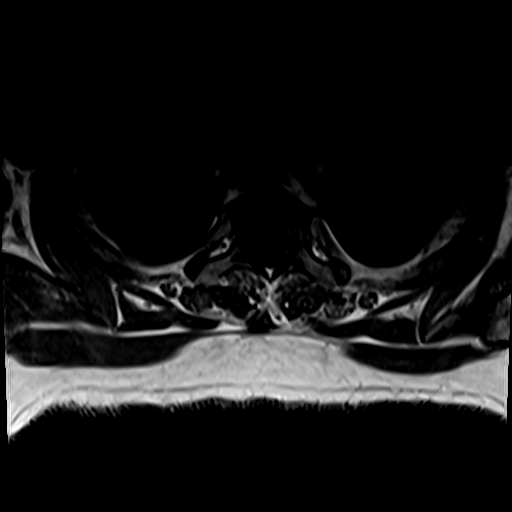

[28 of 48 positions shown; findings below may reference images not displayed]

FINDINGS: Alignment: Straightening of the normal cervical lordosis. 2 mm
anterolisthesis of C2 on C3, mildly progressed from prior.

Vertebrae: Susceptibility artifact related to prior ACDF at C4-C7.
Vertebral body height maintained without acute or chronic fracture.
Bone marrow signal intensity within normal limits. No discrete or
worrisome osseous lesions or abnormal marrow edema.

Cord: Normal signal morphology.

Posterior Fossa, vertebral arteries, paraspinal tissues: Empty sella
noted. Visualized brain and posterior fossa otherwise unremarkable.
Craniocervical junction normal. Paraspinous soft tissues within
normal limits.

Disc levels:

C2-C3: 2 mm anterolisthesis with mild disc bulge and uncovertebral
hypertrophy. Moderate to advanced left-sided facet arthrosis,
progressed from prior. Mild spinal stenosis with moderate left C3
foraminal narrowing. Right neural foramina remains patent.

C3-C4:  Minimal annular disc bulge.  No canal or foraminal stenosis.

C4-C5: Prior fusion. Residual endplate osseous ridging indents and
partially effaces the ventral thecal sac. Mild cord flattening
without cord signal changes. Residual mild spinal stenosis.
Right-sided uncovertebral hypertrophy with residual mild right C5
foraminal narrowing.

C5-C6: Prior fusion. Residual right paracentral osseous ridging
flattens and partially effaces the ventral thecal sac. Residual mild
spinal stenosis with mild cord flattening but no cord signal
changes. Residual mild right C6 foraminal narrowing.

C6-C7: Prior fusion. Residual endplate spurring flattens and
partially faces the ventral thecal sac. Residual moderate spinal
stenosis with mild cord flattening but no cord signal changes. Mild
to moderate bilateral C7 foraminal stenosis, left greater than
right.

C7-T1: Small central disc protrusion indents the ventral thecal sac
(series 8, image 34). No significant spinal stenosis. Foramina
remain patent.

Visualized upper thoracic spine demonstrates no significant finding.
IMPRESSION: 1. Prior ACDF at C4-C7. Residual endplate and uncovertebral spurring
at these levels with resultant mild to moderate diffuse spinal
stenosis, most pronounced at C6-7. Residual mild right C5 and C6
foraminal narrowing, with mild to moderate bilateral C7 foraminal
stenosis.
2. Progressive 2 mm facet mediated anterolisthesis of C2 on C3 with
resultant mild canal and moderate left C3 foraminal stenosis.
3. Small central disc protrusion at C7-T1 without significant
stenosis.
4. Empty sella.

## 2023-08-30 ENCOUNTER — Other Ambulatory Visit: Payer: Self-pay

## 2023-08-30 LAB — PRO B NATRIURETIC PEPTIDE: NT-Pro BNP: 202 pg/mL (ref 0–287)

## 2023-08-30 LAB — BASIC METABOLIC PANEL
BUN/Creatinine Ratio: 12 (ref 12–28)
BUN: 11 mg/dL (ref 8–27)
CO2: 23 mmol/L (ref 20–29)
Calcium: 9 mg/dL (ref 8.7–10.3)
Chloride: 106 mmol/L (ref 96–106)
Creatinine, Ser: 0.89 mg/dL (ref 0.57–1.00)
Glucose: 105 mg/dL — ABNORMAL HIGH (ref 70–99)
Potassium: 3.9 mmol/L (ref 3.5–5.2)
Sodium: 146 mmol/L — ABNORMAL HIGH (ref 134–144)
eGFR: 74 mL/min/{1.73_m2} (ref >59)

## 2023-09-05 ENCOUNTER — Other Ambulatory Visit: Payer: Self-pay | Admitting: Orthopaedic Surgery

## 2023-09-05 ENCOUNTER — Other Ambulatory Visit: Payer: Self-pay

## 2023-09-05 NOTE — Progress Notes (Signed)
Specialty Pharmacy Refill Coordination Note  Crystal Marquez is a 62 y.o. female contacted today regarding refills of specialty medication(s) Dupilumab (Dupixent)   Patient requested (Patient-Rptd) Delivery   Delivery date: (Patient-Rptd) 09/14/23   Verified address: (Patient-Rptd) [address] [place], [place] [address]   Medication will be filled on 12.23.24 for 12.24.24 delivery due to holiday. Will notify patient of delivery date change via mychart.

## 2023-09-07 ENCOUNTER — Encounter: Payer: Self-pay | Admitting: Cardiology

## 2023-09-08 ENCOUNTER — Telehealth: Payer: Self-pay

## 2023-09-08 NOTE — Telephone Encounter (Signed)
Left message on My Chart with normal lab results per Dr. Krasowski's note. Routed to PCP.  

## 2023-09-11 ENCOUNTER — Telehealth: Payer: Self-pay

## 2023-09-11 ENCOUNTER — Other Ambulatory Visit: Payer: Self-pay

## 2023-09-11 NOTE — Telephone Encounter (Signed)
Pt viewed  Lab results on My Chart per Dr. Vanetta Shawl note. Routed to PCP.

## 2023-09-25 ENCOUNTER — Telehealth: Payer: Self-pay

## 2023-09-25 NOTE — Telephone Encounter (Signed)
 Pt viewed monitor results on My Chart per Dr. Vanetta Shawl note. Routed to PCP.

## 2023-09-27 ENCOUNTER — Encounter: Payer: Self-pay | Admitting: Pulmonary Disease

## 2023-09-27 ENCOUNTER — Ambulatory Visit: Payer: Commercial Managed Care - HMO | Admitting: Pulmonary Disease

## 2023-09-27 VITALS — BP 130/84 | HR 56 | Ht 59.0 in | Wt 183.0 lb

## 2023-09-27 DIAGNOSIS — J4550 Severe persistent asthma, uncomplicated: Secondary | ICD-10-CM

## 2023-09-27 DIAGNOSIS — J45909 Unspecified asthma, uncomplicated: Secondary | ICD-10-CM | POA: Insufficient documentation

## 2023-09-27 MED ORDER — TRELEGY ELLIPTA 200-62.5-25 MCG/ACT IN AEPB: 1.0000 | INHALATION_SPRAY | Freq: Every day | RESPIRATORY_TRACT | 11 refills | Status: DC

## 2023-09-27 NOTE — Patient Instructions (Addendum)
 Nice to see you again  We will try Trelegy again, if we can get this at a reasonable cost, this will replace Wixela and Spiriva you can stop them once you start Trelegy  Trelegy is 1 puff once a day  If the Trelegy does not work out we will continue Wixela and Spiriva current inhalers you are on  Continue Dupixent   Return to clinic in 3 months or sooner as needed with Dr. Annella

## 2023-09-27 NOTE — Progress Notes (Signed)
 @Patient  ID: Crystal Marquez, female    DOB: 01/08/1961, 63 y.o.   MRN: [id number]  Chief Complaint  Patient presents with   Follow-up    Severe persistent asthma with acute exacerbation F/U    Referring provider: Silvano Angeline FALCON, NP  HPI:   63 y.o. woman whom we are seeing in follow up for dyspnea, wheezing felt to be related to asthma.  Most recent cardiology note reviewed.  Multiple pharmacy notes including pharmacy office visit note reviewed.  Returns for scheduled follow-up.  Poorly controlled asthma despite triple inhaled therapy via Wixela and Incruse.  No improvement despite Tezspire .  Treated for exacerbation at last visit.  Escalated to Dupixent  given essentially steroid-dependent disease given frequent exacerbations and frequency of prednisone  administration.  First injection 07/12/2023.  Marked improvement in symptoms.  Less dyspneic.  Doing much better.  Mild cough but overall much improved.  She is very pleased.  Feels like her life is back.  Sounds like after discussing with her insurance company Trelegy will be covered this year.  In the past her symptoms were much better controlled with Trelegy, decompensation after switching medications last year.  HPI at initial visit: Patient in normal state of health.  Got COVID October 2022.  Since then she is had intermittent issues with shortness of breath, wheezing.  Occasional cough.  Sometimes worse in the evenings.  Worse with exertion although can occur at rest as well.  The feeling of inability get deep breath, mild chest tightness.  Seems to come and go.  Some days are better than others.  No other seasonal environmental factors he can identify to make things better or worse.  Mild relief with prednisone , albuterol .  However after prednisone  symptoms returned.  She reports history of recurrent bronchitis usually at least once a year sometimes twice a year with season changes.  Reviewed CT chest 08/23/2021 reveals clear lungs  bilaterally on my review interpretation.  Reviewed echocardiogram 09/02/2021 that shows reduction in EF to 40% as well as grade 2 diastolic dysfunction and dilated left atrium, moderate MVR, normal RA size and normal RV function and size.   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   Questionaires / Pulmonary Flowsheets:   ACT:  Asthma Control Test ACT Total Score  12/26/2022  9:39 AM 13  10/31/2022  9:01 AM 6  09/28/2022  9:44 AM 12    MMRC:     No data to display          Epworth:      No data to display          Tests:   FENO:  No results found for: NITRICOXIDE  PFT:    Latest Ref Rng & Units 08/15/2022    3:48 PM  PFT Results  FVC-Pre L 2.18   FVC-Predicted Pre % 79   FVC-Post L 2.30   FVC-Predicted Post % 83   Pre FEV1/FVC % % 85   Post FEV1/FCV % % 88   FEV1-Pre L 1.86   FEV1-Predicted Pre % 87   FEV1-Post L 2.02   DLCO uncorrected ml/min/mmHg 19.17   DLCO UNC% % 112   DLCO corrected ml/min/mmHg 18.06   DLCO COR %Predicted % 106   DLVA Predicted %  121   TLC L 3.98   TLC % Predicted % 92   RV % Predicted % 43   Personally reviewed interpreted as normal spirometry, no bronchodilator spots, lung wise within normal notes, DLCO within normal limits.  WALK:      No data to display          Imaging: Personally reviewed and as per EMR discussion this note LONG TERM MONITOR (3-14 DAYS) Result Date: 09/25/2023 This is event recorder monitor. Patient was monitored was monitored from 08/08/2023 to 08/17/1999 24 to 8 days 23 hours. Patient had minimum heart rate of 51 maximal 226 average heart rate 76.  Predominant underlying rhythm  was sinus rhythm with 25 episode of supraventricular tachycardia fastest episode 4 beats at rate of 226 longest episode 12 beats at rate of 105.  Some episode of supraventricular tachycardia showed possible aberrancy. Summary and conclusions: 25 episode of supraventricular tachycardia. Triggered events for sinus rhythm    Lab Results: Personally reviewed via [facility] with mild elevation in eosinophils 200 and 02/2020 CBC    Component Value Date/Time   WBC 12.2 (H) 10/20/2022 0015   RBC 4.93 10/20/2022 0015   HGB 14.6 10/20/2022 0015   HGB 15.6 07/28/2022 1603   HCT 45.5 10/20/2022 0015   HCT 46.6 07/28/2022 1603   PLT 163 10/20/2022 0015   PLT 178 07/28/2022 1603   MCV 92.3 10/20/2022 0015   MCV 88 07/28/2022 1603   MCH 29.6 10/20/2022 0015   MCHC 32.1 10/20/2022 0015   RDW 13.9 10/20/2022 0015   RDW 14.1 07/28/2022 1603   LYMPHSABS 2.7 10/20/2022 0015   LYMPHSABS 1.6 07/28/2022 1603   MONOABS 0.9 10/20/2022 0015   EOSABS 0.2 10/20/2022 0015   EOSABS 0.4 07/28/2022 1603   BASOSABS 0.0 10/20/2022 0015   BASOSABS 0.0 07/28/2022 1603    BMET    Component Value Date/Time   NA 146 (H) 08/28/2023 1211   K 3.9 08/28/2023 1211   CL 106 08/28/2023 1211   CO2 23 08/28/2023 1211   GLUCOSE 105 (H) 08/28/2023 1211   GLUCOSE 134 (H) 10/20/2022 0015   BUN 11 08/28/2023 1211   CREATININE 0.89 08/28/2023 1211   CALCIUM  9.0 08/28/2023 1211   GFRNONAA >60 10/20/2022 0015    BNP    Component Value Date/Time   BNP 104.2 (H) 10/20/2022 0015    ProBNP    Component Value Date/Time   PROBNP 202 08/28/2023 1211    Specialty Problems       Pulmonary Problems   Mild intermittent asthma without complication   Allergic rhinitis   Asthma   Dyspnea on exertion   Vasomotor rhinitis    Allergies  Allergen Reactions   Lisinopril Shortness Of Breath    Bloating     Immunization History  Administered Date(s) Administered   Influenza,inj,Quad PF,6+ Mos  10/31/2022   Influenza,inj,quad, With Preservative 06/19/2017   Influenza-Unspecified 06/28/2021   PFIZER(Purple Top)SARS-COV-2 Vaccination 12/19/2019, 01/13/2020    Past Medical History:  Diagnosis Date   Age-related osteoporosis without current pathological fracture    Allergic rhinitis    Anxiety    Asthma    B12 deficiency 05/06/2020   Benign essential HTN 08/29/2019   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   Chronic kidney disease    Chronic pain syndrome    Class 2 obesity due to excess calories without serious comorbidity with body mass index (BMI) of 36.0 to 36.9 in adult 08/29/2019   Colitis  05/06/2020   Colon polyp    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HCC) 05/06/2020   Coronary atherosclerosis 10/06/2021   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   Dyslipidemia 08/29/2019   Dyspnea on exertion 10/06/2021   Fibromyalgia 09/30/2021   GERD (gastroesophageal reflux disease)    Hypercalcemia    Hypertension    Iron deficiency anemia due to chronic blood loss 05/06/2020   Low back pain 11/11/2021   Major depressive disorder, single episode    Mild intermittent asthma without complication 07/29/2020   Myopathy    Neck pain 11/11/2021   Neuropathy    Pain in right hand 08/09/2018   Polyarthralgia 09/30/2021   Reflux esophagitis    Sjogren syndrome, unspecified (HCC) 09/30/2021   Sjogren's disease (HCC)    Spinal stenosis of lumbar region 12/13/2017   Tenosynovitis of left hand 08/09/2018   Type 2 diabetes mellitus without complication, without long-term current use of insulin (HCC) 08/29/2019   Unilateral primary osteoarthritis, right knee 11/03/2021    Tobacco History: Social History   Tobacco Use  Smoking Status Never  Smokeless Tobacco Never   Counseling given: Not Answered   Continue to not smoke  Outpatient Encounter Medications as of 09/27/2023  Medication Sig   albuterol  (PROVENTIL ) (2.5 MG/3ML) 0.083% nebulizer solution Take 2.5 mg by nebulization  every 6 (six) hours as needed for wheezing or shortness of breath.   Albuterol -Budesonide (AIRSUPRA ) 90-80 MCG/ACT AERO Inhale 2 puffs into the lungs every 4 (four) hours as needed. TAKE 2 PUFFS BY MOUTH EVERY 4 HOURS AS NEEDED (Patient taking differently: Inhale 2 puffs into the lungs every 4 (four) hours as needed (sob). TAKE 2 PUFFS BY MOUTH EVERY 4 HOURS AS NEEDED)   ALPRAZolam  (XANAX ) 0.5 MG tablet Take 0.5 mg by mouth 3 (three) times daily as needed for anxiety.   Cholecalciferol (VITAMIN D3) 125 MCG (5000 UT) CAPS Take 5,000 Units by mouth daily.   DULoxetine  (CYMBALTA ) 60 MG capsule Take 120 mg by mouth daily.   Dupilumab  (DUPIXENT ) 300 MG/2ML SOAJ Inject 300 mg into the skin every 14 (fourteen) days. **loading dose completed in clinic on 07/12/23 w sample**   ELIQUIS  5 MG TABS tablet TAKE 1 TABLET BY MOUTH TWICE A DAY   FARXIGA  10 MG TABS tablet Take 10 mg by mouth daily.   ferrous sulfate  325 (65 FE) MG tablet Take 325 mg by mouth daily with breakfast.   fexofenadine (ALLEGRA) 180 MG tablet Take 180 mg by mouth daily.   fluticasone -salmeterol (WIXELA INHUB) 500-50 MCG/ACT AEPB Inhale 1 puff into the lungs in the morning and at bedtime.   Fluticasone -Umeclidin-Vilant (TRELEGY ELLIPTA ) 200-62.5-25 MCG/ACT AEPB Inhale 1 puff into the lungs daily.   Folic Acid 0.8 MG CAPS Take 2 capsules by mouth daily.   furosemide  (LASIX ) 20 MG tablet Take 1 tablet (20 mg total) by mouth daily.   hydroxychloroquine  (PLAQUENIL ) 200 MG tablet Take 400 mg by mouth daily.   INCRUSE ELLIPTA  62.5 MCG/ACT AEPB TAKE 1 PUFF BY MOUTH EVERY DAY (Patient taking differently: Inhale 1 puff into the lungs daily.)   ipratropium (ATROVENT ) 0.03 % nasal spray Place 2 sprays into both nostrils 3 (three) times daily as needed for rhinitis.   lidocaine  (LIDODERM ) 5 % Place 1 patch onto the skin daily. Remove & Discard patch within 12 hours or as directed by MD   metFORMIN (GLUCOPHAGE) 500 MG tablet Take 500 mg by mouth 2 (two)  times daily with a meal.   methocarbamol  (ROBAXIN ) 500 MG tablet TAKE  1 TABLET BY MOUTH FOUR TIMES A DAY   metoprolol  succinate (TOPROL -XL) 25 MG 24 hr tablet Take 2 tablets (50 mg total) by mouth daily.   montelukast  (SINGULAIR ) 10 MG tablet Take 10 mg by mouth at bedtime.   NYSTATIN powder Apply 1 application  topically daily as needed (under breast/folds of skin).   pantoprazole  (PROTONIX ) 40 MG tablet Take 40 mg by mouth daily.   polyethylene glycol (MIRALAX  / GLYCOLAX ) 17 g packet Take 17 g by mouth daily as needed for mild constipation.   pregabalin  (LYRICA ) 150 MG capsule Take 150 mg by mouth 3 (three) times daily.   rosuvastatin  (CRESTOR ) 5 MG tablet Take 5 mg by mouth daily.   sacubitril -valsartan  (ENTRESTO ) 49-51 MG Take 1 tablet by mouth 2 (two) times daily.   spironolactone  (ALDACTONE ) 25 MG tablet Take 0.5 tablets (12.5 mg total) by mouth daily.   triamcinolone  (KENALOG ) 0.1 % paste Use as directed 1 Application in the mouth or throat 2 (two) times daily.   No facility-administered encounter medications on file as of 09/27/2023.     Review of Systems  Review of Systems  N/a Physical Exam  BP 130/84 (BP Location: Left Arm, Patient Position: Sitting, Cuff Size: Large)   Pulse (!) 56   Ht 4' 11 (1.499 m)   Wt 183 lb (83 kg)   LMP  (LMP Unknown)   SpO2 96%   BMI 36.96 kg/m   Wt Readings from Last 5 Encounters:  09/27/23 183 lb (83 kg)  08/08/23 188 lb 12.8 oz (85.6 kg)  07/13/23 186 lb 9.6 oz (84.6 kg)  06/28/23 186 lb 12.8 oz (84.7 kg)  03/28/23 182 lb 14.4 oz (83 kg)    BMI Readings from Last 5 Encounters:  09/27/23 36.96 kg/m  08/08/23 38.13 kg/m  07/13/23 37.69 kg/m  06/28/23 37.73 kg/m  03/28/23 36.94 kg/m     Physical Exam General: Well-appearing, sitting in chair Eyes: EOMI, icterus Neck: Supple, no JVP Pulmonary: Clear, no wheeze Cardiovascular: Regular in rhythm, no murmur Abdomen: Nondistended, bowel sounds present MSK: No synovitis, no  joint effusion Neuro: Normal gait, no weakness Psych: Normal mood, full affect   Assessment & Plan:   Dyspnea on exertion: Likely multifactorial.  Contribution of cardiac causes given her reduced EF to 45% as well as left atrial dilation and MVR and diastolic dysfunction on prior TTE, most recent TTE shows normalization of EF.  Fortunately right side looks reassuring in terms of pulmonary hypertension.  Additionally, high suspicion for poorly controlled asthma especially with exacerbation or worsening since COVID infection 06/2021.  Mild improvement with inhalers.  See below for asthma.  Overall, likely multifactorial and difficult control cardiac and pulmonary disease.  Her lack of improvement with aggressive asthma therapy would argue for likely nonpulmonary contribution to her dyspnea.  Severe persistent asthma with frequent exacerbations and steroid dependence: Previous diagnosis.  Suspicious for uncontrolled asthma given worsening in symptoms after COVID infection, post viral.  Disease control was adequate on Trelegy high-dose.  However, no longer covered by insurance.  On Incruse and Wixela.  Still poorly controlled symptoms.  Tezspire  10/2022.  No real improvement with frequent exacerbations essentially steroid-dependent.  This medication was stopped and Dupixent  started first injection 07/12/2023.  Marked improvement in symptoms since starting Dupixent .  Continue triple inhaled therapy with new prescription Trelegy today given prior better controlled symptoms, can resume Wixela/Spiriva if cost is an issue again.  Continue Dupixent .   Return in about 3 months (around 12/26/2023) for  f/u Dr. Annella.   Donnice JONELLE Annella, MD 09/27/2023

## 2023-10-02 ENCOUNTER — Ambulatory Visit: Payer: Managed Care, Other (non HMO) | Admitting: Internal Medicine

## 2023-10-09 ENCOUNTER — Other Ambulatory Visit: Payer: Self-pay

## 2023-10-11 ENCOUNTER — Other Ambulatory Visit: Payer: Self-pay

## 2023-10-11 NOTE — Progress Notes (Signed)
Specialty Pharmacy Refill Coordination Note  Marjarie Mcfarlain is a 63 y.o. female contacted today regarding refills of specialty medication(s) Dupilumab (Dupixent)   Patient requested Delivery   Delivery date: 10/13/23   Verified address: [address] [place], [place] [address]   Medication will be filled on 01.23.25.

## 2023-10-17 ENCOUNTER — Other Ambulatory Visit: Payer: Self-pay | Admitting: Pulmonary Disease

## 2023-10-18 ENCOUNTER — Telehealth: Payer: Self-pay | Admitting: Pulmonary Disease

## 2023-10-18 NOTE — Telephone Encounter (Signed)
Patient is returning a missed call. Please call back

## 2023-10-18 NOTE — Telephone Encounter (Signed)
I called pt to discuss inhalers before sending rx for Wixela  She was supposed to try trelegy and then if ins would not cover we would do spiriva and wixela  I had to [facility] for pt

## 2023-10-19 ENCOUNTER — Other Ambulatory Visit: Payer: Self-pay

## 2023-10-19 ENCOUNTER — Other Ambulatory Visit: Payer: Self-pay | Admitting: Cardiology

## 2023-10-19 MED ORDER — ENTRESTO 49-51 MG PO TABS: 1.0000 | ORAL_TABLET | Freq: Two times a day (BID) | ORAL | 11 refills | Status: AC

## 2023-10-19 NOTE — Telephone Encounter (Signed)
Pt left message so I returned her call. Had to [facility].

## 2023-10-24 ENCOUNTER — Other Ambulatory Visit: Payer: Self-pay | Admitting: Orthopaedic Surgery

## 2023-10-25 ENCOUNTER — Encounter: Payer: Self-pay | Admitting: Pulmonary Disease

## 2023-10-27 NOTE — Telephone Encounter (Signed)
 I have left a message on their machine to have them return call. No answer at this time.

## 2023-10-30 ENCOUNTER — Telehealth: Payer: Self-pay | Admitting: *Deleted

## 2023-10-30 NOTE — Telephone Encounter (Signed)
 Converted from [employer] message: Dr Marygrace Snellen, I have been getting more and more congested. My asthma is getting worse so I know I need prednisone  please . Thank you   ATC patient [phone]).  No answer.  Left detailed message per DPR to return call regarding symptoms.

## 2023-10-31 NOTE — Telephone Encounter (Signed)
I called the pt and there was no answer- LMTCB. ?

## 2023-11-01 LAB — LAB REPORT - SCANNED
A1c: 6.8
EGFR: 69.1

## 2023-11-02 ENCOUNTER — Telehealth: Payer: Self-pay | Admitting: Pulmonary Disease

## 2023-11-02 ENCOUNTER — Other Ambulatory Visit: Payer: Self-pay | Admitting: Emergency Medicine

## 2023-11-02 IMAGING — CT CT ABD-PELV W/ CM
2 of 5 series · 16 of 46 positions shown, 18 images · IV contrast (agent unspecified)
Comparison: CT abdomen and pelvis 06/30/2019. CT abdomen and pelvis
02/28/2020.

CLINICAL DATA: Right lower quadrant abdominal pain.

EXAM:
CT ABDOMEN AND PELVIS WITH CONTRAST
TECHNIQUE: Multidetector CT imaging of the abdomen and pelvis was performed
using the standard protocol following bolus administration of
intravenous contrast.

[Series 3: abdomen 5.0 · axial · 0.96mm/px · z∈[-695,-320]mm · 13 of 89 slices shown, 15 images]
[im 7/89  soft-tissue]
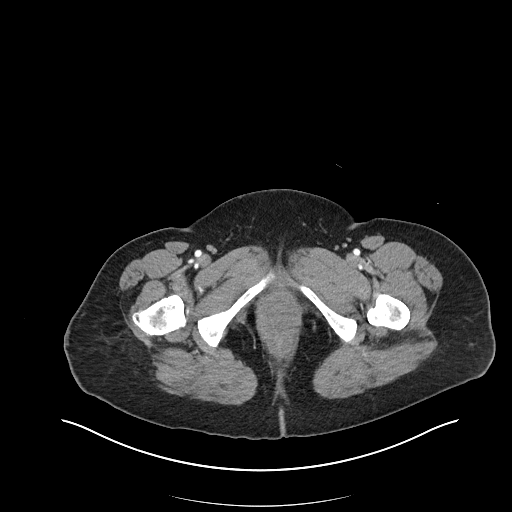
[im 7/89  bone]
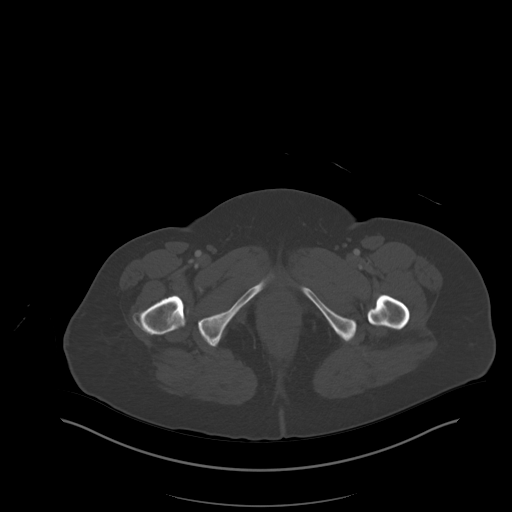
[im 13/89  soft-tissue]
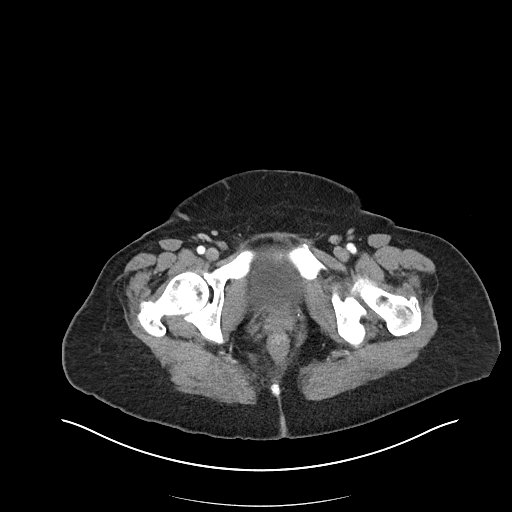
[im 19/89  soft-tissue]
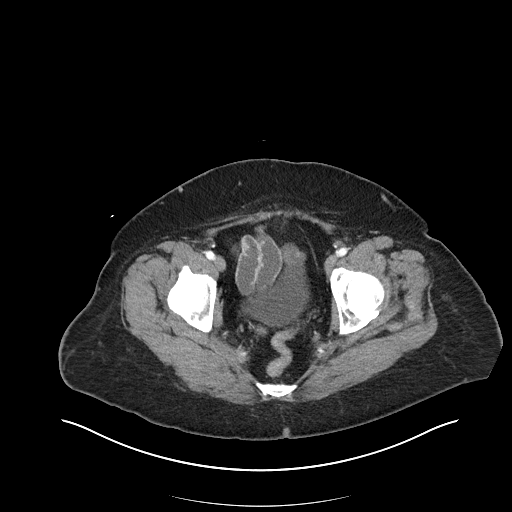
[im 26/89  soft-tissue]
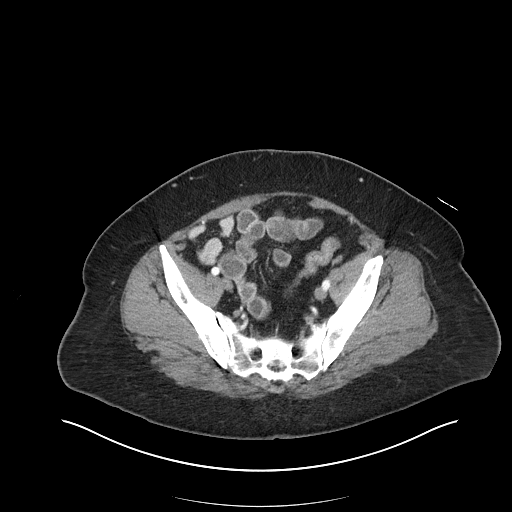
[im 32/89  soft-tissue]
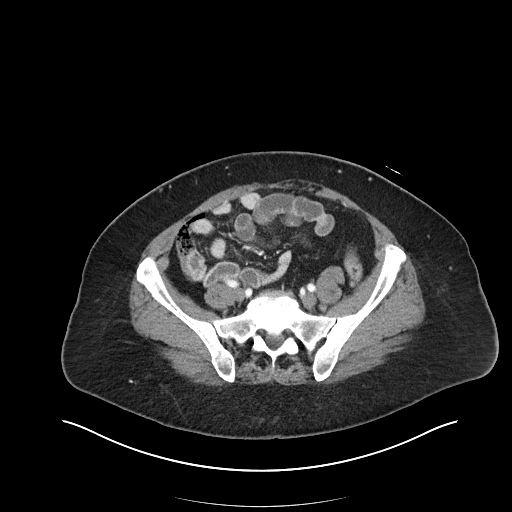
[im 38/89  soft-tissue]
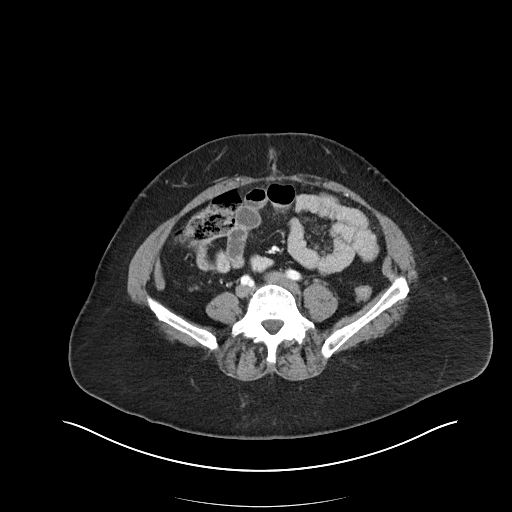
[im 45/89  soft-tissue]
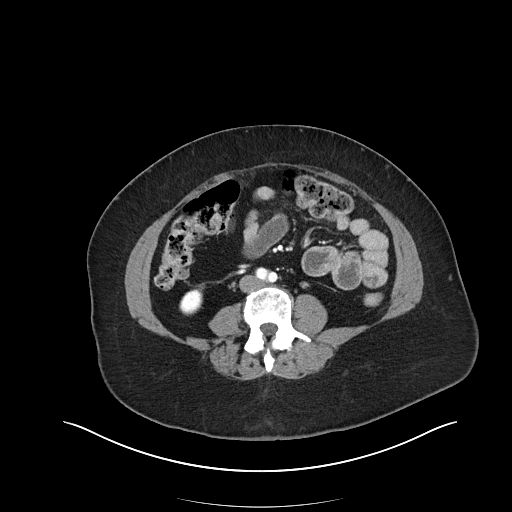
[im 51/89  soft-tissue]
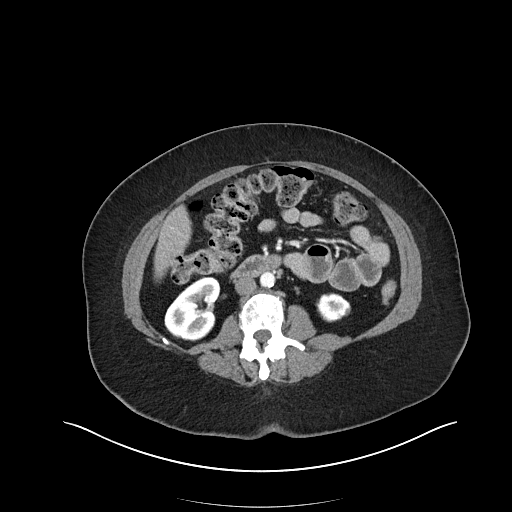
[im 57/89  soft-tissue]
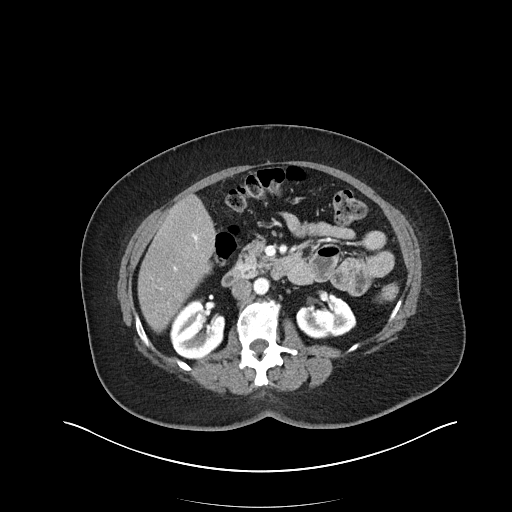
[im 57/89  bone]
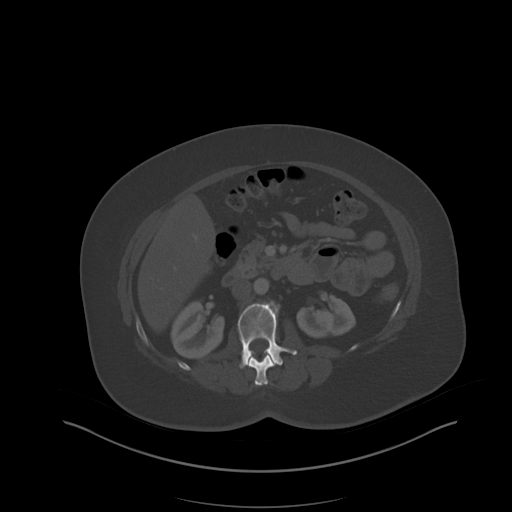
[im 63/89  soft-tissue]
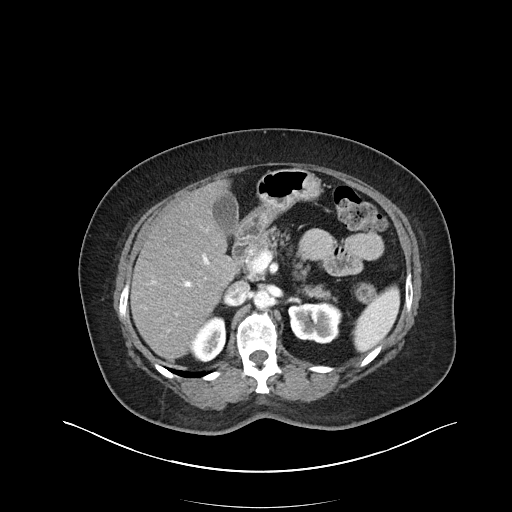
[im 70/89  soft-tissue]
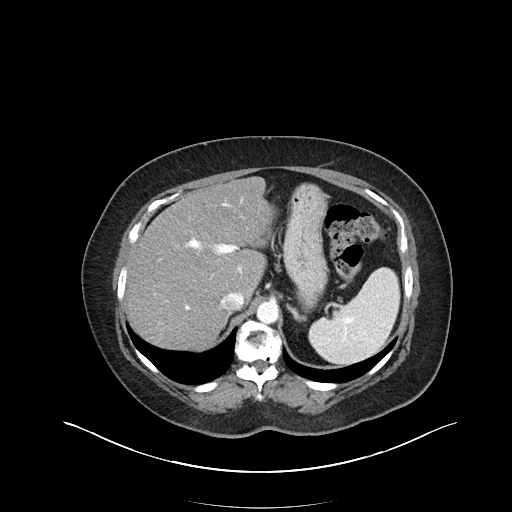
[im 76/89  soft-tissue]
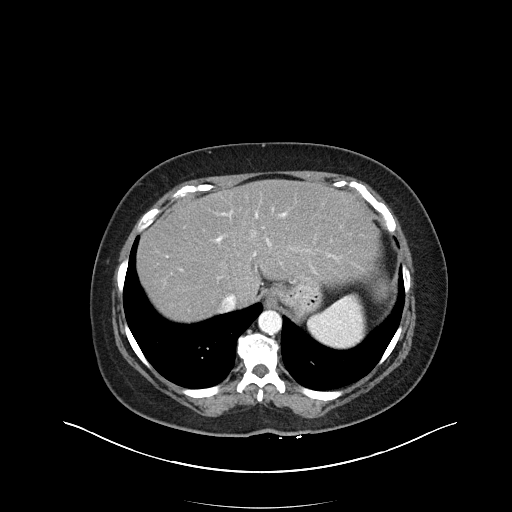
[im 82/89  soft-tissue]
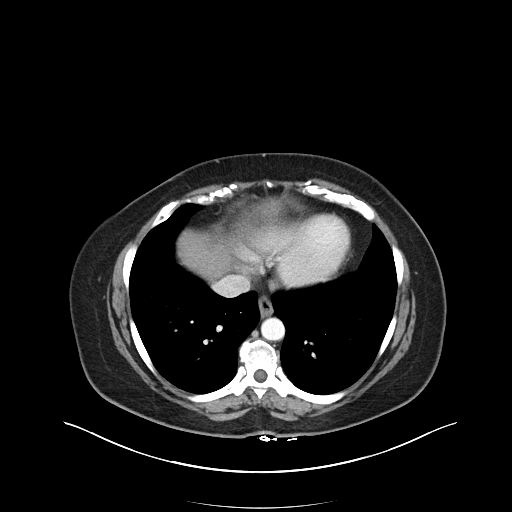

[Series 6: abdomen 3.0 mpr cor · coronal · 0.84mm/px · 3 of 102 slices shown]
[im 34/102  soft-tissue]
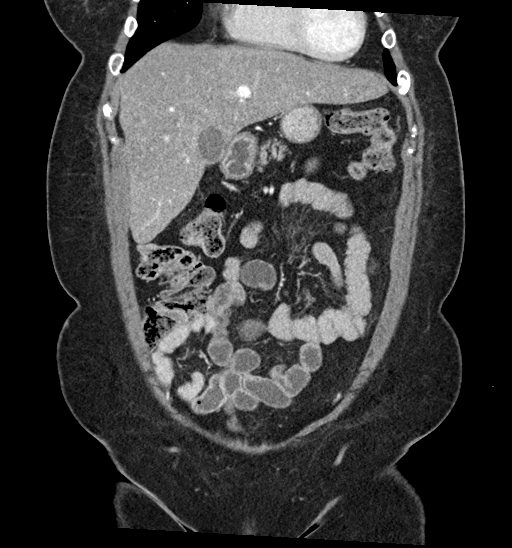
[im 45/102  soft-tissue]
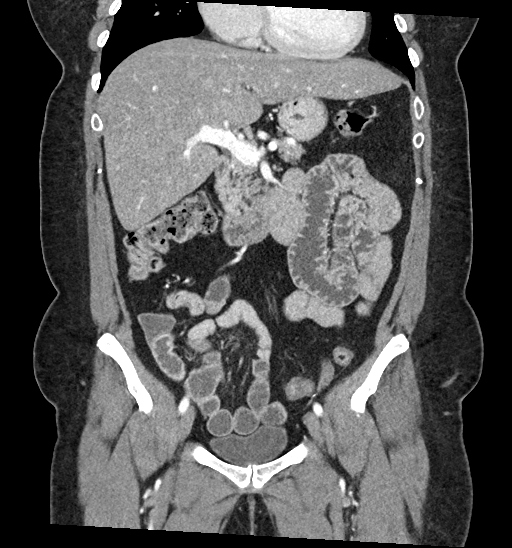
[im 57/102  soft-tissue]
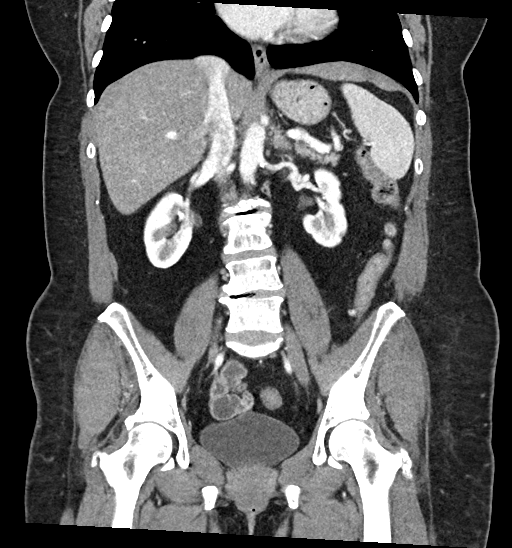

[16 of 46 positions shown; findings below may reference images not displayed]

RADIATION DOSE REDUCTION: This exam was performed according to the
departmental dose-optimization program which includes automated
exposure control, adjustment of the mA and/or kV according to
patient size and/or use of iterative reconstruction technique.

CONTRAST:  100mL OMNIPAQUE IOHEXOL 300 MG/ML  SOLN
FINDINGS: Lower chest: Right lower lobe 4 mm nodule which has increased in
size. There is a stable 2 mm right middle lobe nodule and is stable
3 mm nodule in the right lower lobe.

Hepatobiliary: There is diffuse fatty infiltration of the liver,
unchanged. Gallbladder and bile ducts are within normal limits.

Pancreas: Unremarkable. No pancreatic ductal dilatation or
surrounding inflammatory changes.

Spleen: Normal in size without focal abnormality.

Adrenals/Urinary Tract: Adrenal glands are unremarkable. No
hydronephrosis or perinephric stranding. No renal calculi. Left
renal cortical scarring is unchanged. Bladder is unremarkable.

Stomach/Bowel: Stomach is within normal limits. Appendix appears
normal. No evidence of bowel wall thickening, distention, or
inflammatory changes. There is sigmoid colon diverticulosis.

Vascular/Lymphatic: Aortic atherosclerosis. No enlarged abdominal or
pelvic lymph nodes.

Reproductive: Status post hysterectomy. No adnexal masses.

Other: No abdominal wall hernia or abnormality. No abdominopelvic
ascites.

Musculoskeletal: Degenerative changes affect the spine.
IMPRESSION: No acute localizing process in the abdomen or pelvis.

Stable fatty infiltration of the liver.

## 2023-11-02 MED ORDER — PREDNISONE 10 MG PO TABS: 10.0000 mg | ORAL_TABLET | Freq: Every day | ORAL | 0 refills | Status: DC

## 2023-11-02 MED ORDER — AZITHROMYCIN 250 MG PO TABS: 250.0000 mg | ORAL_TABLET | ORAL | 0 refills | Status: DC

## 2023-11-02 NOTE — Telephone Encounter (Addendum)
I called and spoke with the pt  She has prod cough with yellow sputum, wheezing and increased SOB over the past wk  She is taking her trelegy daily, albuterol inhaler  She is taking dupixent and has injection today  Denies any fevers, aches  No appts open at this time  Please advise, thanks!   Allergies  Allergen Reactions   Lisinopril Shortness Of Breath    Bloating

## 2023-11-02 NOTE — Telephone Encounter (Signed)
Patient states medication causes reaction to antibx. Needs a medication change. Patient phone number is [phone].

## 2023-11-02 NOTE — Telephone Encounter (Signed)
Please give her azithro > z-pack Pred taper > Take 40mg  daily for 3 days, then 30mg  daily for 3 days, then 20mg  daily for 3 days, then 10mg  daily for 3 days, then stop

## 2023-11-02 NOTE — Telephone Encounter (Signed)
I called and spoke with the pt and notified of response from Dr Delton Coombes  She verbalized understanding  Rxs were sent to pharm  Nothing further needed

## 2023-11-03 MED ORDER — AMOXICILLIN-POT CLAVULANATE 875-125 MG PO TABS: 1.0000 | ORAL_TABLET | Freq: Two times a day (BID) | ORAL | 0 refills | Status: DC

## 2023-11-03 NOTE — Telephone Encounter (Signed)
Spoke with the pt  She is asking for alternative for zpack  She states that pharmacy would not fill due to interaction with plaquenil  Tammy, please advise, thanks  Allergies  Allergen Reactions   Lisinopril Shortness Of Breath    Bloating

## 2023-11-03 NOTE — Telephone Encounter (Signed)
Lm x1 for the patient.

## 2023-11-03 NOTE — Telephone Encounter (Signed)
This was from Dr. Delton Coombes  on 11/02/23  Can try Augmentin 875mg  Twice daily  for 7 days-  Do not see any drug allergies to PCN or pot for drug interaction  Will need ov if not improving or this will not work  Will send to. Dr. Delton Coombes  and [place] for FYI   Please contact office for sooner follow up if symptoms do not improve or worsen or seek emergency care

## 2023-11-03 NOTE — Telephone Encounter (Signed)
Dr Delton Coombes, please see msg from pharmacy regarding azithromycin:

## 2023-11-06 ENCOUNTER — Other Ambulatory Visit: Payer: Self-pay

## 2023-11-06 NOTE — Telephone Encounter (Signed)
 Thank you :)

## 2023-11-06 NOTE — Progress Notes (Signed)
Specialty Pharmacy Refill Coordination Note  Crystal Marquez is a 63 y.o. female contacted today regarding refills of specialty medication(s) Dupilumab (Dupixent)   Patient requested Delivery   Delivery date: 11/14/23   Verified address: [address] [place], [place] [address]   Medication will be filled on 11/13/23.

## 2023-11-07 ENCOUNTER — Ambulatory Visit: Payer: Commercial Managed Care - HMO | Attending: Cardiology | Admitting: Cardiology

## 2023-11-07 ENCOUNTER — Encounter: Payer: Self-pay | Admitting: Cardiology

## 2023-11-07 VITALS — BP 130/82 | HR 60 | Ht 59.0 in | Wt 183.0 lb

## 2023-11-07 DIAGNOSIS — E782 Mixed hyperlipidemia: Secondary | ICD-10-CM

## 2023-11-07 DIAGNOSIS — I2510 Atherosclerotic heart disease of native coronary artery without angina pectoris: Secondary | ICD-10-CM | POA: Diagnosis not present

## 2023-11-07 DIAGNOSIS — I420 Dilated cardiomyopathy: Secondary | ICD-10-CM

## 2023-11-07 DIAGNOSIS — I10 Essential (primary) hypertension: Secondary | ICD-10-CM | POA: Diagnosis not present

## 2023-11-07 DIAGNOSIS — I480 Paroxysmal atrial fibrillation: Secondary | ICD-10-CM

## 2023-11-07 DIAGNOSIS — I5022 Chronic systolic (congestive) heart failure: Secondary | ICD-10-CM

## 2023-11-07 NOTE — Progress Notes (Signed)
Cardiology Office Note:    Date:  11/07/2023   ID:  Crystal Marquez, DOB [birth year], MRN [id number]  PCP:  Erskine Emery, NP  Cardiologist:  Gypsy Balsam, MD    Referring MD: Erskine Emery, NP   No chief complaint on file.   History of Present Illness:    Crystal Marquez is a 63 y.o. female past medical history significant for essential hypertension, paroxysmal atrial fibrillation, cardiomyopathy initially detected with ejection fraction 30%, she was put on guideline directed medical therapy improved to normalization.  She did have coronary CT angio which showed up to 25% stenosis clearly not explaining her cardiomyopathy.  She also was complaining of having palpitations.  She did wear monitor did not show any significant arrhythmia.  Comes today to discuss this overall she says she is doing fine complain symptoms having shortness of breath but overall seems to be doing well  Past Medical History:  Diagnosis Date   Age-related osteoporosis without current pathological fracture    Allergic rhinitis    Anxiety    Asthma    B12 deficiency 05/06/2020   Benign essential HTN 08/29/2019   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   Chronic kidney disease    Chronic pain syndrome    Class 2 obesity due to excess calories without serious comorbidity with body mass index (BMI) of 36.0 to 36.9 in adult 08/29/2019   Colitis 05/06/2020   Colon polyp    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HCC) 05/06/2020   Coronary atherosclerosis 10/06/2021   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   Dyslipidemia 08/29/2019   Dyspnea on exertion 10/06/2021   Fibromyalgia 09/30/2021   GERD (gastroesophageal reflux disease)    Hypercalcemia    Hypertension    Iron deficiency anemia due to chronic blood loss 05/06/2020   Low back pain 11/11/2021   Major depressive disorder, single episode    Mild intermittent asthma without complication 07/29/2020   Myopathy    Neck pain 11/11/2021   Neuropathy    Pain in  right hand 08/09/2018   Polyarthralgia 09/30/2021   Reflux esophagitis    Sjogren syndrome, unspecified (HCC) 09/30/2021   Sjogren's disease (HCC)    Spinal stenosis of lumbar region 12/13/2017   Tenosynovitis of left hand 08/09/2018   Type 2 diabetes mellitus without complication, without long-term current use of insulin (HCC) 08/29/2019   Unilateral primary osteoarthritis, right knee 11/03/2021    Past Surgical History:  Procedure Laterality Date   ABDOMINAL HYSTERECTOMY     CARPAL TUNNEL RELEASE Left 2018   CERVICAL FUSION  10/07/2019   Anterior Cervical Discectomy due to spinal stenosis   CESAREAN SECTION     COLONOSCOPY     UPPER GASTROINTESTINAL ENDOSCOPY      Current Medications: Current Meds  Medication Sig   albuterol (PROVENTIL) (2.5 MG/3ML) 0.083% nebulizer solution Take 2.5 mg by nebulization every 6 (six) hours as needed for wheezing or shortness of breath.   ALPRAZolam (XANAX) 0.5 MG tablet Take 0.5 mg by mouth 3 (three) times daily as needed for anxiety.   amoxicillin-clavulanate (AUGMENTIN) 875-125 MG tablet Take 1 tablet by mouth 2 (two) times daily.   Cholecalciferol (VITAMIN D3) 125 MCG (5000 UT) CAPS Take 5,000 Units by mouth daily.   DULoxetine (CYMBALTA) 60 MG capsule Take 120 mg by mouth daily.   Dupilumab (DUPIXENT) 300 MG/2ML SOAJ Inject 300 mg into the skin every 14 (fourteen) days. **loading dose completed in clinic on 07/12/23 w sample**   ELIQUIS 5 MG  TABS tablet TAKE 1 TABLET BY MOUTH TWICE A DAY   FARXIGA 10 MG TABS tablet Take 10 mg by mouth daily.   ferrous sulfate 325 (65 FE) MG tablet Take 325 mg by mouth daily with breakfast.   fluticasone-salmeterol (WIXELA INHUB) 500-50 MCG/ACT AEPB Inhale 1 puff into the lungs in the morning and at bedtime.   Fluticasone-Umeclidin-Vilant (TRELEGY ELLIPTA) 200-62.5-25 MCG/ACT AEPB Inhale 1 puff into the lungs daily.   Folic Acid 0.8 MG CAPS Take 2 capsules by mouth daily.   furosemide (LASIX) 20 MG tablet Take 1  tablet (20 mg total) by mouth daily.   hydroxychloroquine (PLAQUENIL) 200 MG tablet Take 400 mg by mouth daily.   ipratropium (ATROVENT) 0.03 % nasal spray Place 2 sprays into both nostrils 3 (three) times daily as needed for rhinitis.   levocetirizine (XYZAL) 5 MG tablet Take 5 mg by mouth every evening.   metFORMIN (GLUCOPHAGE) 500 MG tablet Take 500 mg by mouth 2 (two) times daily with a meal.   methocarbamol (ROBAXIN) 500 MG tablet TAKE 1 TABLET BY MOUTH FOUR TIMES A DAY   metoprolol succinate (TOPROL-XL) 25 MG 24 hr tablet Take 2 tablets (50 mg total) by mouth daily.   montelukast (SINGULAIR) 10 MG tablet Take 10 mg by mouth at bedtime.   NYSTATIN powder Apply 1 application  topically daily as needed (under breast/folds of skin).   pantoprazole (PROTONIX) 40 MG tablet Take 40 mg by mouth daily.   polyethylene glycol (MIRALAX / GLYCOLAX) 17 g packet Take 17 g by mouth daily as needed for mild constipation.   predniSONE (DELTASONE) 10 MG tablet Take 1 tablet (10 mg total) by mouth daily with breakfast.   pregabalin (LYRICA) 150 MG capsule Take 150 mg by mouth 3 (three) times daily.   rosuvastatin (CRESTOR) 5 MG tablet Take 5 mg by mouth daily.   sacubitril-valsartan (ENTRESTO) 49-51 MG Take 1 tablet by mouth 2 (two) times daily.   spironolactone (ALDACTONE) 25 MG tablet Take 0.5 tablets (12.5 mg total) by mouth daily.   triamcinolone (KENALOG) 0.1 % paste Use as directed 1 Application in the mouth or throat 2 (two) times daily.     Allergies:   Lisinopril   Social History   Socioeconomic History   Marital status: Married    Spouse name: Not on file   Number of children: Not on file   Years of education: Not on file   Highest education level: Not on file  Occupational History   Not on file  Tobacco Use   Smoking status: Never   Smokeless tobacco: Never  Vaping Use   Vaping status: Never Used  Substance and Sexual Activity   Alcohol use: No    Alcohol/week: 0.0 standard drinks  of alcohol   Drug use: No   Sexual activity: Not on file  Other Topics Concern   Not on file  Social History Narrative   Not on file   Social Drivers of Health   Financial Resource Strain: Not on file  Food Insecurity: No Food Insecurity (07/20/2022)   Hunger Vital Sign    Worried About Running Out of Food in the Last Year: Never true    Ran Out of Food in the Last Year: Never true  Transportation Needs: No Transportation Needs (07/20/2022)   PRAPARE - Administrator, Civil Service (Medical): No    Lack of Transportation (Non-Medical): No  Physical Activity: Not on file  Stress: Not on file  Social Connections:  Not on file     Family History: The patient's family history includes Asthma in her mother; COPD in her mother; Colon polyps in her father and sister; Crohn's disease in her sister; Depression in her father; Diabetes in her father and mother; Diverticulitis in her sister; Heart disease in her father, maternal grandmother, and paternal grandmother; Hypercholesterolemia in her sister; Hypertension in her mother; Kidney disease in her brother and mother; Skin cancer in her father. There is no history of Colon cancer, Rectal cancer, or Stomach cancer. ROS:   Please see the history of present illness.    All 14 point review of systems negative except as described per history of present illness  EKGs/Labs/Other Studies Reviewed:         Recent Labs: 08/28/2023: BUN 11; Creatinine, Ser 0.89; NT-Pro BNP 202; Potassium 3.9; Sodium 146  Recent Lipid Panel    Component Value Date/Time   CHOL 122 07/20/2022 0459   TRIG 70 07/20/2022 0459   HDL 61 07/20/2022 0459   CHOLHDL 2.0 07/20/2022 0459   VLDL 14 07/20/2022 0459   LDLCALC 47 07/20/2022 0459    Physical Exam:    VS:  BP 130/82   Pulse 60   Ht 4\' 11"  (1.499 m)   Wt 183 lb (83 kg)   LMP  (LMP Unknown)   SpO2 95%   BMI 36.96 kg/m     Wt Readings from Last 3 Encounters:  11/07/23 183 lb (83 kg)   09/27/23 183 lb (83 kg)  08/08/23 188 lb 12.8 oz (85.6 kg)     GEN:  Well nourished, well developed in no acute distress HEENT: Normal NECK: No JVD; No carotid bruits LYMPHATICS: No lymphadenopathy CARDIAC: RRR, no murmurs, no rubs, no gallops RESPIRATORY:  Clear to auscultation without rales, wheezing or rhonchi  ABDOMEN: Soft, non-tender, non-distended MUSCULOSKELETAL:  No edema; No deformity  SKIN: Warm and dry LOWER EXTREMITIES: no swelling NEUROLOGIC:  Alert and oriented x 3 PSYCHIATRIC:  Normal affect   ASSESSMENT:    1. Dilated cardiomyopathy (HCC) ejection fraction percent based on done in November 2 023   2. Coronary artery disease involving native coronary artery of native heart without angina pectoris   3. Benign essential HTN   4. Chronic systolic congestive heart failure (HCC)   5. Paroxysmal atrial fibrillation (HCC)   6. Mixed dyslipidemia    PLAN:    In order of problems listed above:  Dilated cardiomyopathy.  Will continue guideline directed medical therapy. Palpitations she did have some beats of sinus tachycardia some APCs.  Will try to gently increase dose of beta-blocker she takes metoprolol succinate 50 twice daily willing.  Increase to 75 mg once a day in the morning and 50 in the afternoon and then eventually 75 twice daily. Benign essential hypertension: Blood pressure well-controlled continue present management. Paroxysmal atrial fibrillation maintained sinus rhythm anticoagulated continue. Dyslipidemia she is on Crestor which I will continue LDL 47 HDL 61 good control   Medication Adjustments/Labs and Tests Ordered: Current medicines are reviewed at length with the patient today.  Concerns regarding medicines are outlined above.  No orders of the defined types were placed in this encounter.  Medication changes: No orders of the defined types were placed in this encounter.   Signed, Georgeanna Lea, MD, [facility] 11/07/2023 2:33 PM    [facility]

## 2023-11-07 NOTE — Patient Instructions (Addendum)
Medication Instructions:   INCREASE: Metoprolol Succinate to 75mg  in AM 50mg  in PM then to 75mg  twice daily later if needed   Lab Work: None Ordered If you have labs (blood work) drawn today and your tests are completely normal, you will receive your results only by: MyChart Message (if you have MyChart) OR A paper copy in the mail If you have any lab test that is abnormal or we need to change your treatment, we will call you to review the results.   Testing/Procedures: None Ordered   Follow-Up: At [facility], you and your health needs are our priority.  As part of our continuing mission to provide you with exceptional heart care, we have created designated Provider Care Teams.  These Care Teams include your primary Cardiologist (physician) and Advanced Practice Providers (APPs -  Physician Assistants and Nurse Practitioners) who all work together to provide you with the care you need, when you need it.  We recommend signing up for the patient portal called "MyChart".  Sign up information is provided on this After Visit Summary.  MyChart is used to connect with patients for Virtual Visits (Telemedicine).  Patients are able to view lab/test results, encounter notes, upcoming appointments, etc.  Non-urgent messages can be sent to your provider as well.   To learn more about what you can do with MyChart, go to [email or web address].    Your next appointment:   6 month(s)  The format for your next appointment:   In Person  Provider:   Gypsy Balsam, MD    Other Instructions NA

## 2023-11-13 ENCOUNTER — Other Ambulatory Visit: Payer: Self-pay

## 2023-11-13 ENCOUNTER — Telehealth: Payer: Self-pay

## 2023-11-13 ENCOUNTER — Other Ambulatory Visit (HOSPITAL_COMMUNITY): Payer: Self-pay

## 2023-11-13 NOTE — Telephone Encounter (Signed)
 Received notification from [facility] that pt's copay card was rejecting due to "Nonmatched ID". Pt enrolled into program on 07/11/23. I have compared information in [facility] to the information listed in Epic and everything matches. Nothing has changed from when claim successfully adjudicated on 10/12/23.  Contacted pharmacy help desk for additional information. Per rep, pt's account is in a "pending" state and pt would need to call in and have the account unlocked. Conferenced the pt in to the phone call to complete the process. Pt was provided with a manufacturer debit card to cover remaining copay left over after copay card payment.   Collected information and forwarded to [place] at [facility]. Nothing further needed at this time.

## 2023-11-16 ENCOUNTER — Other Ambulatory Visit: Payer: Self-pay | Admitting: Physician Assistant

## 2023-11-16 ENCOUNTER — Encounter: Payer: Self-pay | Admitting: Cardiology

## 2023-11-17 ENCOUNTER — Other Ambulatory Visit: Payer: Self-pay

## 2023-11-17 MED ORDER — METOPROLOL SUCCINATE ER 25 MG PO TB24: 75.0000 mg | ORAL_TABLET | Freq: Every day | ORAL | 3 refills | Status: AC

## 2023-11-17 NOTE — Telephone Encounter (Signed)
 Marland Kitchen

## 2023-11-30 ENCOUNTER — Other Ambulatory Visit: Payer: Self-pay | Admitting: Emergency Medicine

## 2023-12-07 ENCOUNTER — Other Ambulatory Visit: Payer: Self-pay

## 2023-12-07 NOTE — Progress Notes (Signed)
 Specialty Pharmacy Refill Coordination Note  Alonnie Bieker is a 63 y.o. female contacted today regarding refills of specialty medication(s) Dupilumab (Dupixent)   Patient requested (Patient-Rptd) Delivery   Delivery date: (Patient-Rptd) 12/12/23   Verified address: (Patient-Rptd) [address] [place], [place] [address]   Medication will be filled on 12/11/23.

## 2023-12-11 ENCOUNTER — Other Ambulatory Visit: Payer: Self-pay

## 2023-12-14 ENCOUNTER — Other Ambulatory Visit: Payer: Self-pay | Admitting: Cardiology

## 2023-12-14 DIAGNOSIS — I480 Paroxysmal atrial fibrillation: Secondary | ICD-10-CM

## 2023-12-14 NOTE — Telephone Encounter (Signed)
 Prescription refill request for Eliquis received. Indication: PAF Last office visit:11/07/23 Scr: 0.89 08/28/23 epic Age: 63 Weight: 83kg

## 2023-12-27 ENCOUNTER — Encounter: Payer: Self-pay | Admitting: Pulmonary Disease

## 2023-12-27 ENCOUNTER — Ambulatory Visit: Payer: Commercial Managed Care - HMO | Admitting: Pulmonary Disease

## 2023-12-27 VITALS — BP 129/82 | HR 53 | Ht 59.0 in | Wt 185.0 lb

## 2023-12-27 DIAGNOSIS — J4550 Severe persistent asthma, uncomplicated: Secondary | ICD-10-CM

## 2023-12-27 DIAGNOSIS — R0609 Other forms of dyspnea: Secondary | ICD-10-CM

## 2023-12-27 MED ORDER — PREDNISONE 10 MG PO TABS: ORAL_TABLET | ORAL | 0 refills | Status: AC

## 2023-12-27 NOTE — Progress Notes (Signed)
 @Patient  ID: Crystal Marquez, female    DOB: 1960-12-24, 63 y.o.   MRN: [id number]  Chief Complaint  Patient presents with   Follow-up    Pt states  dry cough x 1 month nothing has help to relieve    Referring provider: Erskine Emery, NP  HPI:   63 y.o. woman whom we are seeing in follow up for dyspnea, wheezing felt to be related to asthma.  Most recent cardiology note reviewed.  Multiple pharmacy notes, multiple telephone notes from our office reviewed.  Returns for scheduled follow-up.  Continues on Dupixent.  Prescribed Trelegy once again, issues with coverage insurance in the past.  Previously maintained on Wixela and Incruse.  Had exacerbation 10/2023 treated with antibiotics and steroids.  Improvement with this.  Overall seems like frequency exacerbations are better on Dupixent but still occurring.  HPI at initial visit: Patient in normal state of health.  Got COVID October 2022.  Since then she is had intermittent issues with shortness of breath, wheezing.  Occasional cough.  Sometimes worse in the evenings.  Worse with exertion although can occur at rest as well.  The feeling of inability get deep breath, mild chest tightness.  Seems to come and go.  Some days are better than others.  No other seasonal environmental factors he can identify to make things better or worse.  Mild relief with prednisone, albuterol.  However after prednisone symptoms returned.  She reports history of recurrent bronchitis usually at least once a year sometimes twice a year with season changes.  Reviewed CT chest 08/23/2021 reveals clear lungs bilaterally on my review interpretation.  Reviewed echocardiogram 09/02/2021 that shows reduction in EF to 40% as well as grade 2 diastolic dysfunction and dilated left atrium, moderate MVR, normal RA size and normal RV function and size.   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   Questionaires / Pulmonary Flowsheets:   ACT:  Asthma Control Test ACT Total Score  12/26/2022  9:39 AM 13  10/31/2022  9:01 AM 6  09/28/2022  9:44 AM 12    MMRC:     No data to display          Epworth:      No data to display          Tests:   FENO:  No results found for: "NITRICOXIDE"  PFT:    Latest Ref Rng & Units 08/15/2022    3:48 PM  PFT Results  FVC-Pre L 2.18   FVC-Predicted Pre % 79   FVC-Post L 2.30   FVC-Predicted Post % 83   Pre FEV1/FVC % % 85   Post FEV1/FCV % % 88   FEV1-Pre L 1.86   FEV1-Predicted Pre % 87   FEV1-Post L 2.02   DLCO uncorrected ml/min/mmHg 19.17   DLCO UNC% % 112   DLCO corrected ml/min/mmHg 18.06   DLCO COR %Predicted % 106   DLVA Predicted % 121   TLC L 3.98   TLC % Predicted % 92   RV % Predicted % 43   Personally reviewed interpreted as normal spirometry, no bronchodilator spots, lung wise within normal notes, DLCO within normal limits.  WALK:      No data to display  Imaging: Personally reviewed and as per EMR discussion this note No results found.   Lab Results: Personally reviewed via [facility] with mild elevation in eosinophils 200 and 02/2020 CBC    Component Value Date/Time   WBC 12.2 (H) 10/20/2022 0015   RBC 4.93 10/20/2022 0015   HGB 14.6 10/20/2022 0015   HGB 15.6 07/28/2022 1603   HCT 45.5 10/20/2022 0015   HCT 46.6 07/28/2022 1603   PLT 163 10/20/2022 0015   PLT 178 07/28/2022 1603   MCV 92.3 10/20/2022 0015   MCV 88 07/28/2022 1603   MCH 29.6 10/20/2022 0015   MCHC 32.1 10/20/2022 0015   RDW 13.9 10/20/2022 0015   RDW 14.1 07/28/2022 1603   LYMPHSABS 2.7 10/20/2022 0015   LYMPHSABS 1.6 07/28/2022 1603   MONOABS 0.9  10/20/2022 0015   EOSABS 0.2 10/20/2022 0015   EOSABS 0.4 07/28/2022 1603   BASOSABS 0.0 10/20/2022 0015   BASOSABS 0.0 07/28/2022 1603    BMET    Component Value Date/Time   NA 146 (H) 08/28/2023 1211   K 3.9 08/28/2023 1211   CL 106 08/28/2023 1211   CO2 23 08/28/2023 1211   GLUCOSE 105 (H) 08/28/2023 1211   GLUCOSE 134 (H) 10/20/2022 0015   BUN 11 08/28/2023 1211   CREATININE 0.89 08/28/2023 1211   CALCIUM 9.0 08/28/2023 1211   GFRNONAA >60 10/20/2022 0015    BNP    Component Value Date/Time   BNP 104.2 (H) 10/20/2022 0015    ProBNP    Component Value Date/Time   PROBNP 202 08/28/2023 1211    Specialty Problems       Pulmonary Problems   Mild intermittent asthma without complication   Allergic rhinitis   Asthma   Dyspnea on exertion   Vasomotor rhinitis   Steroid-dependent asthma    Allergies  Allergen Reactions   Lisinopril Shortness Of Breath    Bloating     Immunization History  Administered Date(s) Administered   Influenza,inj,Quad PF,63 y.o. Mos 10/31/2022   Influenza,inj,quad, With Preservative 06/19/2017   Influenza-Unspecified 06/28/2021   PFIZER(Purple Top)SARS-COV-2 Vaccination 12/19/2019, 01/13/2020    Past Medical History:  Diagnosis Date   Age-related osteoporosis without current pathological fracture    Allergic rhinitis    Anxiety    Asthma    B12 deficiency 05/06/2020   Benign essential HTN 08/29/2019   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   Chronic kidney disease    Chronic pain syndrome    Class 2 obesity due to excess calories without serious comorbidity with body mass index (BMI) of 36.0 to 36.9 in adult 08/29/2019   Colitis 05/06/2020   Colon polyp    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HCC) 05/06/2020   Coronary atherosclerosis 10/06/2021   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   Dyslipidemia 08/29/2019   Dyspnea on exertion 10/06/2021   Fibromyalgia 09/30/2021   GERD (gastroesophageal reflux disease)     Hypercalcemia    Hypertension    Iron deficiency anemia due to chronic blood loss 05/06/2020   Low back pain 11/11/2021   Major depressive disorder, single episode    Mild intermittent asthma without complication 07/29/2020   Myopathy    Neck pain 11/11/2021   Neuropathy    Pain in right hand 08/09/2018   Polyarthralgia 09/30/2021   Reflux esophagitis    Sjogren syndrome, unspecified (HCC) 09/30/2021   Sjogren's disease (HCC)    Spinal stenosis of lumbar region 12/13/2017   Tenosynovitis of left hand 08/09/2018  Type 2 diabetes mellitus without complication, without long-term current use of insulin (HCC) 08/29/2019   Unilateral primary osteoarthritis, right knee 11/03/2021    Tobacco History: Social History   Tobacco Use  Smoking Status Never  Smokeless Tobacco Never   Counseling given: Not Answered   Continue to not smoke  Outpatient Encounter Medications as of 12/27/2023  Medication Sig   albuterol (PROVENTIL) (2.5 MG/3ML) 0.083% nebulizer solution Take 2.5 mg by nebulization every 6 (six) hours as needed for wheezing or shortness of breath.   ALPRAZolam (XANAX) 0.5 MG tablet Take 0.5 mg by mouth 3 (three) times daily as needed for anxiety.   amoxicillin-clavulanate (AUGMENTIN) 875-125 MG tablet Take 1 tablet by mouth 2 (two) times daily.   Cholecalciferol (VITAMIN D3) 125 MCG (5000 UT) CAPS Take 5,000 Units by mouth daily.   DULoxetine (CYMBALTA) 60 MG capsule Take 120 mg by mouth daily.   Dupilumab (DUPIXENT) 300 MG/2ML SOAJ Inject 300 mg into the skin every 14 (fourteen) days. **loading dose completed in clinic on 07/12/23 w sample**   ELIQUIS 5 MG TABS tablet TAKE 1 TABLET BY MOUTH TWICE A DAY   FARXIGA 10 MG TABS tablet Take 10 mg by mouth daily.   ferrous sulfate 325 (65 FE) MG tablet Take 325 mg by mouth daily with breakfast.   fluticasone-salmeterol (WIXELA INHUB) 500-50 MCG/ACT AEPB Inhale 1 puff into the lungs in the morning and at bedtime.    Fluticasone-Umeclidin-Vilant (TRELEGY ELLIPTA) 200-62.5-25 MCG/ACT AEPB Inhale 1 puff into the lungs daily.   Folic Acid 0.8 MG CAPS Take 2 capsules by mouth daily.   furosemide (LASIX) 20 MG tablet Take 1 tablet (20 mg total) by mouth daily.   hydroxychloroquine (PLAQUENIL) 200 MG tablet Take 400 mg by mouth daily.   ipratropium (ATROVENT) 0.03 % nasal spray Place 2 sprays into both nostrils 3 (three) times daily as needed for rhinitis.   levocetirizine (XYZAL) 5 MG tablet Take 5 mg by mouth every evening.   metFORMIN (GLUCOPHAGE) 500 MG tablet Take 500 mg by mouth 2 (two) times daily with a meal.   methocarbamol (ROBAXIN) 500 MG tablet TAKE 1 TABLET BY MOUTH FOUR TIMES A DAY   metoprolol succinate (TOPROL-XL) 25 MG 24 hr tablet Take 3 tablets (75 mg total) by mouth daily. Take 2 tablets in the morning and 1 tablet in the evening.   montelukast (SINGULAIR) 10 MG tablet Take 10 mg by mouth at bedtime.   NYSTATIN powder Apply 1 application  topically daily as needed (under breast/folds of skin).   pantoprazole (PROTONIX) 40 MG tablet Take 40 mg by mouth daily.   polyethylene glycol (MIRALAX / GLYCOLAX) 17 g packet Take 17 g by mouth daily as needed for mild constipation.   predniSONE (DELTASONE) 10 MG tablet Take 4 tablets (40 mg total) by mouth daily with breakfast for 5 days, THEN 2 tablets (20 mg total) daily with breakfast for 5 days, THEN 1 tablet (10 mg total) daily with breakfast for 5 days.   pregabalin (LYRICA) 150 MG capsule Take 150 mg by mouth 3 (three) times daily.   rosuvastatin (CRESTOR) 5 MG tablet Take 5 mg by mouth daily.   sacubitril-valsartan (ENTRESTO) 49-51 MG Take 1 tablet by mouth 2 (two) times daily.   spironolactone (ALDACTONE) 25 MG tablet Take 0.5 tablets (12.5 mg total) by mouth daily.   triamcinolone (KENALOG) 0.1 % paste Use as directed 1 Application in the mouth or throat 2 (two) times daily.   [DISCONTINUED] predniSONE (DELTASONE)  10 MG tablet TAKE 1 TABLET (10 MG  TOTAL) BY MOUTH DAILY WITH BREAKFAST.   No facility-administered encounter medications on file as of 12/27/2023.     Review of Systems  Review of Systems  N/a Physical Exam  BP 129/82 (BP Location: Left Arm, Patient Position: Sitting, Cuff Size: Normal)   Pulse (!) 53   Ht 4\' 11"  (1.499 m)   Wt 185 lb (83.9 kg)   LMP  (LMP Unknown)   SpO2 98%   BMI 37.37 kg/m   Wt Readings from Last 5 Encounters:  12/27/23 185 lb (83.9 kg)  11/07/23 183 lb (83 kg)  09/27/23 183 lb (83 kg)  08/08/23 188 lb 12.8 oz (85.6 kg)  07/13/23 186 lb 9.6 oz (84.6 kg)    BMI Readings from Last 5 Encounters:  12/27/23 37.37 kg/m  11/07/23 36.96 kg/m  09/27/23 36.96 kg/m  08/08/23 38.13 kg/m  07/13/23 37.69 kg/m     Physical Exam General: Well-appearing, sitting in chair Eyes: EOMI, icterus Neck: Supple, no JVP Pulmonary: Clear, no wheeze Cardiovascular: Regular in rhythm, no murmur Abdomen: Nondistended, bowel sounds present MSK: No synovitis, no joint effusion Neuro: Normal gait, no weakness Psych: Normal mood, full affect   Assessment & Plan:   Dyspnea on exertion: Likely multifactorial.  Contribution of cardiac causes given her reduced EF to 45% as well as left atrial dilation and MVR and diastolic dysfunction on prior TTE, most recent TTE shows normalization of EF.  Fortunately right side looks reassuring in terms of pulmonary hypertension.  Additionally, high suspicion for poorly controlled asthma especially with exacerbation or worsening since COVID infection 06/2021.  Improvement with inhalers, biologic therapies.  See below for asthma.  Overall, likely multifactorial and difficult control cardiac and pulmonary disease.  Her lack of improvement with aggressive asthma therapy would argue for likely nonpulmonary contribution to her dyspnea.  Severe persistent asthma with frequent exacerbations and history of steroid dependence with acute exacerbation likely due to bad pollen: Previous  diagnosis.  Suspicious for uncontrolled asthma given worsening in symptoms after COVID infection, post viral.  Disease control was adequate on Trelegy high-dose.  However, no longer covered by insurance.  On Incruse and Wixela.  Still poorly controlled symptoms.  [place] 10/2022.  No real improvement with frequent exacerbations essentially steroid-dependent.  This medication was stopped and Dupixent started first injection 06/2023.  Marked improvement in symptoms since starting Dupixent.  Continue triple inhaled therapy with Trelegy.  Continue Dupixent.  Prednisone taper today.  With frequent wheezing and exacerbations will need to evaluate for possible tracheobronchomalacia if continues to have symptoms after pollen season, we discussed at length this possibility and the fact that ongoing asthma therapies would not help with this.  Certainly historically she responds well to prednisone which makes me think asthma is a real problem.  However, if tracheobronchomalacia were present I would not commit her to long-term steroid therapy   Return in about 3 months (around 03/27/2024) for f/u Dr. Judeth Horn.   Karren Burly, MD 12/27/2023

## 2023-12-27 NOTE — Patient Instructions (Addendum)
 Nice to see you today  I sent a prednisone taper over the next 3 weeks I hope this helps  Continue Trelegy and Dupixent as you are  If the wheezing and cough continues to be a problem I think we need to look for something called tracheobronchomalacia, we can do this with a special CT scan.  If you are still having issues in 3 months I think we should move forward with this at the time of the next visit.  Return to clinic in 3 months or sooner as needed with Dr. Judeth Horn

## 2024-01-01 ENCOUNTER — Other Ambulatory Visit: Payer: Self-pay | Admitting: Family

## 2024-01-01 DIAGNOSIS — R221 Localized swelling, mass and lump, neck: Secondary | ICD-10-CM

## 2024-01-02 ENCOUNTER — Other Ambulatory Visit: Payer: Self-pay

## 2024-01-03 ENCOUNTER — Other Ambulatory Visit (HOSPITAL_COMMUNITY): Payer: Self-pay

## 2024-01-03 ENCOUNTER — Other Ambulatory Visit: Payer: Self-pay | Admitting: Pulmonary Disease

## 2024-01-03 ENCOUNTER — Other Ambulatory Visit: Payer: Self-pay

## 2024-01-03 DIAGNOSIS — J4550 Severe persistent asthma, uncomplicated: Secondary | ICD-10-CM

## 2024-01-03 MED ORDER — DUPIXENT 300 MG/2ML ~~LOC~~ SOAJ
300.0000 mg | SUBCUTANEOUS | 1 refills | Status: DC
  Filled 2024-01-03: qty 4, 28d supply, fill #0
  Filled 2024-02-05: qty 4, 28d supply, fill #1
  Filled 2024-03-04: qty 4, 28d supply, fill #2
  Filled 2024-03-27 – 2024-03-29 (×2): qty 4, 28d supply, fill #3
  Filled 2024-04-24: qty 4, 28d supply, fill #4
  Filled 2024-05-21: qty 4, 28d supply, fill #5

## 2024-01-03 NOTE — Telephone Encounter (Signed)
 Advise specialty med

## 2024-01-03 NOTE — Progress Notes (Signed)
 Specialty Pharmacy Refill Coordination Note  Crystal Marquez is a 63 y.o. female contacted today regarding refills of specialty medication(s) Dupixent.  Patient requested (Patient-Rptd) Delivery   Delivery date: (Patient-Rptd) 01/10/24   Verified address: (Patient-Rptd) [address] [place], [place] [address]   Medication will be filled on 01/09/24.   This fill date is pending response to refill request from provider. Patient is aware and if they have not received fill by intended date, they must follow up with pharmacy.

## 2024-01-03 NOTE — Telephone Encounter (Signed)
 Refill sent for DUPIXENT to [facility]: [phone]   Dose: 300mg  subcut every 14 days  Last OV: 12/27/23 Provider: Dr. Marygrace Snellen  Next OV: 3 months (04/01/24)  Geraldene Kleine, PharmD, MPH, BCPS Clinical Pharmacist (Rheumatology and Pulmonology)

## 2024-01-04 ENCOUNTER — Ambulatory Visit
Admission: RE | Admit: 2024-01-04 | Discharge: 2024-01-04 | Disposition: A | Discharge status: Home / Self Care | Source: Ambulatory Visit | Attending: Family | Admitting: Family

## 2024-01-04 DIAGNOSIS — R221 Localized swelling, mass and lump, neck: Secondary | ICD-10-CM

## 2024-01-04 MED ORDER — IOPAMIDOL (ISOVUE-300) INJECTION 61%
75.0000 mL | Freq: Once | INTRAVENOUS | Status: AC | PRN
  Administered 2024-01-04: 75 mL via INTRAVENOUS

## 2024-01-09 ENCOUNTER — Telehealth: Payer: Self-pay | Admitting: Pharmacist

## 2024-01-09 ENCOUNTER — Other Ambulatory Visit: Payer: Self-pay

## 2024-01-09 ENCOUNTER — Other Ambulatory Visit (HOSPITAL_COMMUNITY): Payer: Self-pay

## 2024-01-09 NOTE — Progress Notes (Signed)
 CMM Key: [id number]

## 2024-01-09 NOTE — Progress Notes (Signed)
 Received notification from [employer] regarding a prior authorization for DUPIXENT . Authorization has been APPROVED from 01/09/24 to 01/08/25.   Authorization # [id number]  Geraldene Kleine, PharmD, MPH, BCPS, CPP Clinical Pharmacist (Rheumatology and Pulmonology)

## 2024-01-09 NOTE — Telephone Encounter (Signed)
 Received notification from [employer] regarding a prior authorization for DUPIXENT . Authorization has been APPROVED from 01/09/24 to 01/08/25.   Authorization # [id number]  Geraldene Kleine, PharmD, MPH, BCPS, CPP Clinical Pharmacist (Rheumatology and Pulmonology)

## 2024-01-09 NOTE — Telephone Encounter (Signed)
 Submitted an URGENT Prior Authorization RENEWAL request to [employer] for DUPIXENT  via CoverMyMeds. Will update once we receive a response.  Key: [id number]

## 2024-01-11 ENCOUNTER — Other Ambulatory Visit: Payer: Self-pay | Admitting: Cardiology

## 2024-01-17 ENCOUNTER — Other Ambulatory Visit: Payer: Self-pay | Admitting: Physician Assistant

## 2024-01-25 ENCOUNTER — Other Ambulatory Visit: Payer: Self-pay

## 2024-01-25 ENCOUNTER — Encounter: Payer: Self-pay | Admitting: Pulmonary Disease

## 2024-01-25 DIAGNOSIS — K120 Recurrent oral aphthae: Secondary | ICD-10-CM | POA: Insufficient documentation

## 2024-01-25 DIAGNOSIS — E785 Hyperlipidemia, unspecified: Secondary | ICD-10-CM | POA: Insufficient documentation

## 2024-01-26 ENCOUNTER — Ambulatory Visit: Payer: Self-pay

## 2024-01-26 NOTE — Telephone Encounter (Signed)
 Copied from CRM [id number]. Topic: Clinical - Red Word Triage >> Jan 26, 2024  1:09 PM Juliana Ocean wrote: Red Word that prompted transfer to Nurse Triage: SOB, wheezing, chest congestion, getting worse.  Asking for something to be called in   Chief Complaint: Cough Symptoms: Cough with yellow sputum, hoarse voice, increased shortness of breath  Frequency: Constant  Disposition: [] ED /[x] Urgent Care (no appt availability in office) / [] Appointment(In office/virtual)/ []  [facility]/ [] Home Care/ [] Refused Recommended Disposition /[] [facility]/ []  Follow-up with PCP Additional Notes: Patient reports for the last 4-5 days she has had an increased cough that is productive of yellow sputum. Patient states she is also having some increases shortness of breath that is controlled with her inhaler and nebulizer. She states that she believes she may need a prescription for prednisone . Patient advised that the office is closed and I suggested that she could go to urgent care. She agreed with this and will call back on Monday if she still feels the need to be seen.     E2C2 Pulmonary Triage - Initial Assessment Questions "Chief Complaint (e.g., cough, sob, wheezing, fever, chills, sweat or additional symptoms) *Go to specific symptom protocol after initial questions. Cough   "How long have symptoms been present?" Chronic problem, worsening for the last 4-5 days    Have you tested for COVID or Flu? Note: If not, ask patient if a home test can be taken. If so, instruct patient to call back for positive results. No  MEDICINES:   "Have you used any OTC meds to help with symptoms?" Yes If yes, ask "What medications?" Allergy  medication   "Have you used your inhalers/maintenance medication?" Yes If yes, "What medications?" Inhaler and nebulizer   If inhaler, ask "How many puffs and how often?" Note: Review instructions on medication in the chart. 2 times a day   OXYGEN: "Do you wear  supplemental oxygen?" No If yes, "How many liters are you supposed to use?" N/A  "Do you monitor your oxygen levels?" Yes If yes, "What is your reading (oxygen level) today?" 95% has been the lowest   Reason for Disposition  [1] Known COPD or other severe lung disease (i.e., bronchiectasis, cystic fibrosis, lung surgery) AND [2] worsening symptoms (i.e., increased sputum purulence or amount, increased breathing difficulty  Answer Assessment - Initial Assessment Questions 1. ONSET: "When did the cough begin?"      4-5 days  2. SEVERITY: "How bad is the cough today?"      Moderate  3. SPUTUM: "Describe the color of your sputum" (none, dry cough; clear, white, yellow, green)     Yellow  4. HEMOPTYSIS: "Are you coughing up any blood?" If so ask: "How much?" (flecks, streaks, tablespoons, etc.)     No 5. DIFFICULTY BREATHING: "Are you having difficulty breathing?" If Yes, ask: "How bad is it?" (e.g., mild, moderate, severe)    - MILD: No SOB at rest, mild SOB with walking, speaks normally in sentences, can lie down, no retractions, pulse < 100.    - MODERATE: SOB at rest, SOB with minimal exertion and prefers to sit, cannot lie down flat, speaks in phrases, mild retractions, audible wheezing, pulse 100-120.    - SEVERE: Very SOB at rest, speaks in single words, struggling to breathe, sitting hunched forward, retractions, pulse > 120      Moderate  6. FEVER: "Do you have a fever?" If Yes, ask: "What is your temperature, how was it measured, and  when did it start?"     No 7. CARDIAC HISTORY: "Do you have any history of heart disease?" (e.g., heart attack, congestive heart failure)      Yes 8. LUNG HISTORY: "Do you have any history of lung disease?"  (e.g., pulmonary embolus, asthma, emphysema)     Yes 9. PE RISK FACTORS: "Do you have a history of blood clots?" (or: recent major surgery, recent prolonged travel, bedridden)     No 10. OTHER SYMPTOMS: "Do you have any other symptoms?" (e.g.,  runny nose, wheezing, chest pain)       Hoarse voice  Protocols used: Cough - Acute Productive-A-AH

## 2024-01-26 NOTE — Telephone Encounter (Signed)
 FYI

## 2024-01-30 ENCOUNTER — Other Ambulatory Visit: Payer: Self-pay

## 2024-02-02 DIAGNOSIS — A4901 Methicillin susceptible Staphylococcus aureus infection, unspecified site: Secondary | ICD-10-CM | POA: Insufficient documentation

## 2024-02-05 ENCOUNTER — Other Ambulatory Visit: Payer: Self-pay

## 2024-02-05 ENCOUNTER — Other Ambulatory Visit: Payer: Self-pay | Admitting: Pharmacy Technician

## 2024-02-05 NOTE — Progress Notes (Signed)
 Specialty Pharmacy Refill Coordination Note  Crystal Marquez is a 63 y.o. female contacted today regarding refills of specialty medication(s) Dupilumab  (Dupixent )   Patient requested Delivery   Delivery date: 02/07/24   Verified address: [address], [place], [place] [address]   Medication will be filled on 02/06/24.

## 2024-02-05 NOTE — Progress Notes (Signed)
 Specialty Pharmacy Ongoing Clinical Assessment Note  Crystal Marquez is a 63 y.o. female who is being followed by the specialty pharmacy service for RxSp Asthma/COPD   Patient's specialty medication(s) reviewed today: Dupilumab  (Dupixent )   Missed doses in the last 4 weeks: 0   Patient/Caregiver did not have any additional questions or concerns.   Therapeutic benefit summary: Patient is achieving benefit   Adverse events/side effects summary: No adverse events/side effects   Patient's therapy is appropriate to: Continue    Goals Addressed             This Visit's Progress    Maintain optimal adherence to therapy       Patient is on track. Patient will maintain adherence       Minimize recurrence of flares       Patient is on track. Patient will maintain adherence. Patient states that she has been well controlled but has had some asthma symptoms due to increased pollen. Prior to that she was doing well on treatment and expects this to resolve after the pollen decreases.           Follow up: 6 months  [name] M Regino Fournet Specialty Pharmacist

## 2024-02-06 ENCOUNTER — Other Ambulatory Visit: Payer: Self-pay

## 2024-02-08 DIAGNOSIS — K117 Disturbances of salivary secretion: Secondary | ICD-10-CM | POA: Insufficient documentation

## 2024-02-18 DIAGNOSIS — M3503 Sicca syndrome with myopathy: Secondary | ICD-10-CM | POA: Insufficient documentation

## 2024-03-04 ENCOUNTER — Other Ambulatory Visit: Payer: Self-pay

## 2024-03-04 NOTE — Progress Notes (Signed)
 Specialty Pharmacy Refill Coordination Note  Crystal Marquez is a 62 y.o. female contacted today regarding refills of specialty medication(s) Dupilumab  (Dupixent )   Patient requested Delivery   Delivery date: 03/05/24   Verified address: [address] [place], [place] [address]   Medication will be filled on 03/04/24.

## 2024-03-06 ENCOUNTER — Other Ambulatory Visit: Payer: Self-pay

## 2024-03-06 NOTE — Progress Notes (Signed)
 Clinical Intervention Note  Clinical Intervention Notes: Patient reported starting Ozempic. No DDIs identified with Dupixent .   Clinical Intervention Outcomes: Prevention of an adverse drug event   Advertising account planner

## 2024-03-07 DIAGNOSIS — K120 Recurrent oral aphthae: Secondary | ICD-10-CM | POA: Insufficient documentation

## 2024-03-18 DIAGNOSIS — R21 Rash and other nonspecific skin eruption: Secondary | ICD-10-CM | POA: Insufficient documentation

## 2024-03-20 ENCOUNTER — Encounter: Payer: Self-pay | Admitting: Pulmonary Disease

## 2024-03-20 MED ORDER — PREDNISONE 20 MG PO TABS: ORAL_TABLET | ORAL | 0 refills | Status: AC

## 2024-03-25 ENCOUNTER — Other Ambulatory Visit (HOSPITAL_COMMUNITY): Payer: Self-pay

## 2024-03-27 ENCOUNTER — Other Ambulatory Visit: Payer: Self-pay

## 2024-03-29 ENCOUNTER — Other Ambulatory Visit: Payer: Self-pay

## 2024-03-29 NOTE — Progress Notes (Signed)
 Specialty Pharmacy Refill Coordination Note  Crystal Marquez is a 63 y.o. female contacted today regarding refills of specialty medication(s) Dupilumab  (Dupixent )   Patient requested Delivery   Delivery date: 04/02/24   Verified address: [address], [place], [place] [address]   Medication will be filled on 04/01/24.

## 2024-04-01 ENCOUNTER — Ambulatory Visit (INDEPENDENT_AMBULATORY_CARE_PROVIDER_SITE_OTHER): Admitting: Pulmonary Disease

## 2024-04-01 ENCOUNTER — Encounter: Payer: Self-pay | Admitting: Pulmonary Disease

## 2024-04-01 VITALS — BP 112/68 | HR 67 | Temp 98.4°F | Ht 59.5 in | Wt 178.0 lb

## 2024-04-01 DIAGNOSIS — R0609 Other forms of dyspnea: Secondary | ICD-10-CM

## 2024-04-01 DIAGNOSIS — J4550 Severe persistent asthma, uncomplicated: Secondary | ICD-10-CM

## 2024-04-01 MED ORDER — PREDNISONE 10 MG PO TABS: ORAL_TABLET | ORAL | 0 refills | Status: DC

## 2024-04-01 NOTE — Progress Notes (Signed)
 @Patient  ID: Crystal Marquez, female    DOB: 01-30-1961, 63 y.o.   MRN: [id number]  Chief Complaint  Patient presents with   Follow-up    Pt is having frequent asthma attacks, 3-4 times per week but can be up to twice per day. Most of her attacks occur either in the outside heat or directly after heat exposure. Her o2 levels have gotten to lower 90s and upper 80s upon exertion.     Referring provider: Silvano Angeline FALCON, NP  HPI:   63 y.o. woman whom we are seeing in follow up for dyspnea, wheezing felt to be related to asthma. Multiple pharmacy notes, multiple telephone notes from our office reviewed.  Returns for scheduled follow-up.  Continues on Dupixent .  Continues Trelegy.  Symptoms really no better.  Exacerbation every 2 to 3 months.  Better than monthly but still not acceptable.  Using rescue inhaler multiple times a day on average.  We discussed staying on prednisone  long-term given intermittent improvement with steroid therapy.  We discussed evaluate for tracheobronchomalacia.  We discussed hopefully finding lowest dose of steroids possible to maintain symptoms.  HPI at initial visit: Patient in normal state of health.  Got COVID October 2022.  Since then she is had intermittent issues with shortness of breath, wheezing.  Occasional cough.  Sometimes worse in the evenings.  Worse with exertion although can occur at rest as well.  The feeling of inability get deep breath, mild chest tightness.  Seems to come and go.  Some days are better than others.  No other seasonal environmental factors he can identify to make things better or worse.  Mild relief with prednisone , albuterol .  However after prednisone  symptoms returned.  She reports history of recurrent bronchitis usually at least once a year sometimes twice a year with season changes.  Reviewed CT chest 08/23/2021 reveals clear lungs bilaterally on my review interpretation.  Reviewed echocardiogram 09/02/2021 that shows reduction in EF to  40% as well as grade 2 diastolic dysfunction and dilated left atrium, moderate MVR, normal RA size and normal RV function and size.   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   Questionaires / Pulmonary Flowsheets:   ACT:  Asthma Control Test ACT Total Score  04/01/2024  9:53 AM 12  12/26/2022  9:39 AM 13  10/31/2022  9:01 AM 6    MMRC:     No data to display          Epworth:      No data to display          Tests:   FENO:  No results found for: NITRICOXIDE  PFT:    Latest Ref Rng & Units 08/15/2022    3:48 PM  PFT Results  FVC-Pre L 2.18   FVC-Predicted Pre % 79   FVC-Post L 2.30   FVC-Predicted Post % 83   Pre FEV1/FVC % % 85   Post FEV1/FCV % % 88   FEV1-Pre L 1.86   FEV1-Predicted Pre % 87   FEV1-Post L 2.02   DLCO uncorrected ml/min/mmHg 19.17   DLCO UNC% % 112   DLCO corrected ml/min/mmHg 18.06   DLCO COR %Predicted % 106   DLVA Predicted % 121   TLC  L 3.98   TLC % Predicted % 92   RV % Predicted % 43   Personally reviewed interpreted as normal spirometry, no bronchodilator spots, lung wise within normal notes, DLCO within normal limits.  WALK:      No data to display          Imaging: Personally reviewed and as per EMR discussion this note No results found.   Lab Results: Personally reviewed via [facility] with mild elevation in eosinophils 200 and 02/2020 CBC    Component Value Date/Time   WBC 12.2 (H) 10/20/2022 0015   RBC 4.93 10/20/2022 0015   HGB 14.6 10/20/2022 0015   HGB 15.6 07/28/2022 1603   HCT 45.5 10/20/2022 0015   HCT 46.6 07/28/2022 1603   PLT 163 10/20/2022 0015   PLT 178  07/28/2022 1603   MCV 92.3 10/20/2022 0015   MCV 88 07/28/2022 1603   MCH 29.6 10/20/2022 0015   MCHC 32.1 10/20/2022 0015   RDW 13.9 10/20/2022 0015   RDW 14.1 07/28/2022 1603   LYMPHSABS 2.7 10/20/2022 0015   LYMPHSABS 1.6 07/28/2022 1603   MONOABS 0.9 10/20/2022 0015   EOSABS 0.2 10/20/2022 0015   EOSABS 0.4 07/28/2022 1603   BASOSABS 0.0 10/20/2022 0015   BASOSABS 0.0 07/28/2022 1603    BMET    Component Value Date/Time   NA 146 (H) 08/28/2023 1211   K 3.9 08/28/2023 1211   CL 106 08/28/2023 1211   CO2 23 08/28/2023 1211   GLUCOSE 105 (H) 08/28/2023 1211   GLUCOSE 134 (H) 10/20/2022 0015   BUN 11 08/28/2023 1211   CREATININE 0.89 08/28/2023 1211   CALCIUM  9.0 08/28/2023 1211   GFRNONAA >60 10/20/2022 0015    BNP    Component Value Date/Time   BNP 104.2 (H) 10/20/2022 0015    ProBNP    Component Value Date/Time   PROBNP 202 08/28/2023 1211    Specialty Problems       Pulmonary Problems   Mild intermittent asthma without complication   Allergic rhinitis   Asthma   Dyspnea on exertion   Vasomotor rhinitis   Steroid-dependent asthma    Allergies  Allergen Reactions   Lisinopril Shortness Of Breath    Bloating     Immunization History  Administered Date(s) Administered   Influenza,inj,Quad PF,6+ Mos 10/31/2022   Influenza,inj,quad, With Preservative 06/19/2017   Influenza-Unspecified 06/28/2021   PFIZER(Purple Top)SARS-COV-2 Vaccination 12/19/2019, 01/13/2020    Past Medical History:  Diagnosis Date   Age-related osteoporosis without current pathological fracture    Allergic rhinitis    Anxiety    Asthma    B12 deficiency 05/06/2020   Benign essential HTN 08/29/2019   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   Chronic kidney disease    Chronic pain syndrome    Class 2 obesity due to excess calories without serious comorbidity with body mass index (BMI) of 36.0 to 36.9 in adult 08/29/2019   Colitis 05/06/2020   Colon polyp     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HCC) 05/06/2020   Coronary atherosclerosis 10/06/2021   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   Dyslipidemia 08/29/2019   Dyspnea on exertion 10/06/2021   Fibromyalgia 09/30/2021   GERD (gastroesophageal reflux disease)    Hypercalcemia    Hypertension    Iron deficiency anemia due to chronic blood loss 05/06/2020   Low back pain 11/11/2021   Major depressive disorder, single episode    Mild intermittent asthma without complication 07/29/2020   Myopathy  Neck pain 11/11/2021   Neuropathy    Pain in right hand 08/09/2018   Polyarthralgia 09/30/2021   Reflux esophagitis    Sjogren syndrome, unspecified (HCC) 09/30/2021   Sjogren's disease (HCC)    Spinal stenosis of lumbar region 12/13/2017   Tenosynovitis of left hand 08/09/2018   Type 2 diabetes mellitus without complication, without long-term current use of insulin (HCC) 08/29/2019   Unilateral primary osteoarthritis, right knee 11/03/2021    Tobacco History: Social History   Tobacco Use  Smoking Status Never  Smokeless Tobacco Never   Counseling given: Not Answered   Continue to not smoke  Outpatient Encounter Medications as of 04/01/2024  Medication Sig   albuterol  (PROVENTIL ) (2.5 MG/3ML) 0.083% nebulizer solution Take 2.5 mg by nebulization every 6 (six) hours as needed for wheezing or shortness of breath.   ALPRAZolam  (XANAX ) 0.5 MG tablet Take 0.5 mg by mouth 3 (three) times daily as needed for anxiety.   amoxicillin -clavulanate (AUGMENTIN ) 875-125 MG tablet Take 1 tablet by mouth 2 (two) times daily.   Cholecalciferol (VITAMIN D3) 125 MCG (5000 UT) CAPS Take 5,000 Units by mouth daily.   DULoxetine  (CYMBALTA ) 60 MG capsule Take 120 mg by mouth daily.   Dupilumab  (DUPIXENT ) 300 MG/2ML SOAJ Inject 300 mg into the skin every 14 (fourteen) days.   ELIQUIS  5 MG TABS tablet TAKE 1 TABLET BY MOUTH TWICE A DAY   FARXIGA  10 MG TABS tablet Take 10 mg by mouth daily.   ferrous sulfate  325 (65  FE) MG tablet Take 325 mg by mouth daily with breakfast.   fluticasone -salmeterol (WIXELA INHUB) 500-50 MCG/ACT AEPB Inhale 1 puff into the lungs in the morning and at bedtime.   Fluticasone -Umeclidin-Vilant (TRELEGY ELLIPTA ) 200-62.5-25 MCG/ACT AEPB Inhale 1 puff into the lungs daily.   Folic Acid 0.8 MG CAPS Take 2 capsules by mouth daily.   furosemide  (LASIX ) 20 MG tablet Take 1 tablet (20 mg total) by mouth daily.   hydroxychloroquine  (PLAQUENIL ) 200 MG tablet Take 400 mg by mouth daily.   ipratropium (ATROVENT ) 0.03 % nasal spray Place 2 sprays into both nostrils 3 (three) times daily as needed for rhinitis.   levocetirizine (XYZAL ) 5 MG tablet Take 5 mg by mouth every evening.   metFORMIN (GLUCOPHAGE) 500 MG tablet Take 500 mg by mouth 2 (two) times daily with a meal.   methocarbamol  (ROBAXIN ) 500 MG tablet TAKE 1 TABLET BY MOUTH FOUR TIMES A DAY   metoprolol  succinate (TOPROL -XL) 25 MG 24 hr tablet Take 3 tablets (75 mg total) by mouth daily. Take 2 tablets in the morning and 1 tablet in the evening.   montelukast  (SINGULAIR ) 10 MG tablet Take 10 mg by mouth at bedtime.   NYSTATIN powder Apply 1 application  topically daily as needed (under breast/folds of skin).   OZEMPIC, 0.25 OR 0.5 MG/DOSE, 2 MG/3ML SOPN Inject 0.5 mg into the skin once a week.   pantoprazole  (PROTONIX ) 40 MG tablet Take 40 mg by mouth daily.   polyethylene glycol (MIRALAX  / GLYCOLAX ) 17 g packet Take 17 g by mouth daily as needed for mild constipation.   predniSONE  (DELTASONE ) 10 MG tablet Take 4 tablets (40 mg total) by mouth daily for 5 days, THEN 3 tablets (30 mg total) daily for 5 days, THEN 2 tablets (20 mg total) daily for 5 days, THEN 1 tablet (10 mg total) daily.   pregabalin  (LYRICA ) 150 MG capsule Take 150 mg by mouth 3 (three) times daily.   rosuvastatin  (CRESTOR ) 5 MG  tablet Take 5 mg by mouth daily.   sacubitril -valsartan  (ENTRESTO ) 49-51 MG Take 1 tablet by mouth 2 (two) times daily.   spironolactone   (ALDACTONE ) 25 MG tablet Take 0.5 tablets (12.5 mg total) by mouth daily.   triamcinolone  (KENALOG ) 0.1 % paste Use as directed 1 Application in the mouth or throat 2 (two) times daily.   No facility-administered encounter medications on file as of 04/01/2024.     Review of Systems  Review of Systems  N/a Physical Exam  BP 112/68 (BP Location: Left Arm, Patient Position: Sitting, Cuff Size: Large)   Pulse 67   Temp 98.4 F (36.9 C) (Oral)   Ht 4' 11.5 (1.511 m)   Wt 178 lb (80.7 kg)   LMP  (LMP Unknown)   SpO2 98%   BMI 35.35 kg/m   Wt Readings from Last 5 Encounters:  04/01/24 178 lb (80.7 kg)  12/27/23 185 lb (83.9 kg)  11/07/23 183 lb (83 kg)  09/27/23 183 lb (83 kg)  08/08/23 188 lb 12.8 oz (85.6 kg)    BMI Readings from Last 5 Encounters:  04/01/24 35.35 kg/m  12/27/23 37.37 kg/m  11/07/23 36.96 kg/m  09/27/23 36.96 kg/m  08/08/23 38.13 kg/m     Physical Exam General: Well-appearing, sitting in chair Eyes: EOMI, icterus Neck: Supple, no JVP Pulmonary: Clear, no wheeze Cardiovascular: Regular in rhythm, no murmur Abdomen: Nondistended, bowel sounds present MSK: No synovitis, no joint effusion Neuro: Normal gait, no weakness Psych: Normal mood, full affect   Assessment & Plan:   Dyspnea on exertion: Likely multifactorial.  Contribution of cardiac causes given her reduced EF to 45% as well as left atrial dilation and MVR and diastolic dysfunction on prior TTE, most recent TTE shows normalization of EF.  Fortunately right side looks reassuring in terms of pulmonary hypertension.  Additionally, high suspicion for poorly controlled asthma especially with exacerbation or worsening since COVID infection 06/2021.  Improvement with inhalers, biologic therapies.  See below for asthma.  Overall, likely multifactorial and difficult control cardiac and pulmonary disease.  Her lack of improvement with aggressive asthma therapy would argue for likely nonpulmonary  contribution to her dyspnea.  Severe persistent asthma with frequent exacerbations and history of steroid dependence: Previous diagnosis.  Suspicious for uncontrolled asthma given worsening in symptoms after COVID infection, post viral.  Disease control was adequate on Trelegy high-dose.  However, no longer covered by insurance.  On Incruse and Wixela.  Still poorly controlled symptoms.  [place]  10/2022.  No real improvement with frequent exacerbations essentially steroid-dependent.  This medication was stopped and Dupixent  started first injection 06/2023.  Marked improvement in symptoms since starting Dupixent .  Continue triple inhaled therapy with Trelegy.  Continue Dupixent .  Prednisone  taper today with plans to continue on prednisone  10 mg daily after tapering.  CT scan high-resolution ordered today to evaluate for possible tracheobronchomalacia as well given persistent symptoms.  However given marked improvement with prednisone , albeit temporarily, this is likely just very severe asthma.   Return in about 2 months (around 06/02/2024) for f/u Dr. Annella, after CT scan.   Donnice JONELLE Annella, MD 04/01/2024

## 2024-04-01 NOTE — Patient Instructions (Signed)
 Like to see you again  We will repeat a CT scan to make sure there is not a cause other than the asthma can cause wheezing cough  Since the prednisone  helps I think we to stay on this every day for now  Take as prescribed, 40 mg for 5 days, then 30 mg for 5 days, then 20 mg for 5 days, then remain on 10 mg daily every day thereafter.  We will work to find the lowest dose of prednisone  that improves your symptoms  Return to clinic in 2 months or sooner as needed with Dr. Annella

## 2024-04-07 ENCOUNTER — Other Ambulatory Visit: Payer: Self-pay | Admitting: Internal Medicine

## 2024-04-08 DIAGNOSIS — E782 Mixed hyperlipidemia: Secondary | ICD-10-CM | POA: Insufficient documentation

## 2024-04-11 ENCOUNTER — Ambulatory Visit: Admitting: Pulmonary Disease

## 2024-04-24 ENCOUNTER — Other Ambulatory Visit: Payer: Self-pay

## 2024-04-25 ENCOUNTER — Telehealth: Payer: Self-pay | Admitting: Pulmonary Disease

## 2024-04-25 NOTE — Telephone Encounter (Signed)
 CT Scan Denied for the reason below. If you would like a peer to peer. Please provide me a direct contact number, time and date. I will then schedule this.  Your doctor told us  that you have shortness of breath. The request cannot be approved because:  Further imaging can be done when results of a chest x-ray,( I SENT XRAY FROM 10/20/2022, MOST RECENT) performed after your symptoms started or changed, failed to find the source of your problem. The notes sent to us  do not describe these chest x-ray results.   This finding was based on review of [employer] Chest Imaging Guidelines Section(s): Dyspnea/Shortness of Breath (CH 5.1).

## 2024-04-26 ENCOUNTER — Other Ambulatory Visit: Payer: Self-pay

## 2024-04-26 NOTE — Progress Notes (Signed)
 Specialty Pharmacy Refill Coordination Note  Crystal Marquez is a 63 y.o. female contacted today regarding refills of specialty medication(s) Dupilumab  (Dupixent )   Patient requested Delivery   Delivery date: 04/30/24   Verified address: [address], [place], [place] [address]   Medication will be filled on 04/29/24.

## 2024-04-28 ENCOUNTER — Other Ambulatory Visit: Payer: Self-pay | Admitting: Pulmonary Disease

## 2024-04-28 DIAGNOSIS — J4550 Severe persistent asthma, uncomplicated: Secondary | ICD-10-CM

## 2024-04-29 ENCOUNTER — Other Ambulatory Visit: Payer: Self-pay

## 2024-04-29 NOTE — Telephone Encounter (Signed)
 APPROVED

## 2024-05-09 DIAGNOSIS — G4700 Insomnia, unspecified: Secondary | ICD-10-CM | POA: Insufficient documentation

## 2024-05-13 ENCOUNTER — Ambulatory Visit
Admission: RE | Admit: 2024-05-13 | Discharge: 2024-05-13 | Disposition: A | Discharge status: Home / Self Care | Source: Ambulatory Visit | Attending: Pulmonary Disease | Admitting: Pulmonary Disease

## 2024-05-13 DIAGNOSIS — J4550 Severe persistent asthma, uncomplicated: Secondary | ICD-10-CM

## 2024-05-13 DIAGNOSIS — R0609 Other forms of dyspnea: Secondary | ICD-10-CM

## 2024-05-14 ENCOUNTER — Encounter: Payer: Self-pay | Admitting: Pulmonary Disease

## 2024-05-17 ENCOUNTER — Encounter: Payer: Self-pay | Admitting: Cardiology

## 2024-05-17 ENCOUNTER — Ambulatory Visit: Attending: Cardiology | Admitting: Cardiology

## 2024-05-17 VITALS — BP 102/58 | HR 77 | Ht 59.0 in | Wt 182.4 lb

## 2024-05-17 DIAGNOSIS — E782 Mixed hyperlipidemia: Secondary | ICD-10-CM

## 2024-05-17 DIAGNOSIS — J4541 Moderate persistent asthma with (acute) exacerbation: Secondary | ICD-10-CM | POA: Diagnosis not present

## 2024-05-17 DIAGNOSIS — I2510 Atherosclerotic heart disease of native coronary artery without angina pectoris: Secondary | ICD-10-CM

## 2024-05-17 DIAGNOSIS — I420 Dilated cardiomyopathy: Secondary | ICD-10-CM | POA: Diagnosis not present

## 2024-05-17 DIAGNOSIS — I10 Essential (primary) hypertension: Secondary | ICD-10-CM | POA: Diagnosis not present

## 2024-05-17 NOTE — Progress Notes (Signed)
 Cardiology Office Note:    Date:  05/17/2024   ID:  Crystal Marquez, DOB 19-Feb-1961, MRN [id number]  PCP:  Crystal Angeline FALCON, NP  Cardiologist:  Crystal Fitch, MD    Referring MD: Crystal Angeline FALCON, NP   No chief complaint on file.   History of Present Illness:    Crystal Marquez is a 63 y.o. female past medical history significant for essential hypertension, paroxysmal atrial fibrillation, cardiomyopathy initially detected with ejection fraction 30%, coronary CT angio showed only 25% stenosis clearly not explanation for her cardiomyopathy, however after appropriate guideline directed medical therapy her echocardiogram showed normalization of left ventricle ejection fraction.  Comes today to months for follow-up cardiac wise doing well denies have any chest pain tightness squeezing pressure mid chest biggest problem she has is bronchospasm.  She is being followed by pulmonologist for asthma.  Past Medical History:  Diagnosis Date   Age-related osteoporosis without current pathological fracture    Allergic rhinitis    Anxiety    Asthma    B12 deficiency 05/06/2020   Benign essential HTN 08/29/2019   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   Chronic kidney disease    Chronic pain syndrome    Class 2 obesity due to excess calories without serious comorbidity with body mass index (BMI) of 36.0 to 36.9 in adult 08/29/2019   Colitis 05/06/2020   Colon polyp    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HCC) 05/06/2020   Coronary atherosclerosis 10/06/2021   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   Dyslipidemia 08/29/2019   Dyspnea on exertion 10/06/2021   Fibromyalgia 09/30/2021   GERD (gastroesophageal reflux disease)    Hypercalcemia    Hypertension    Iron deficiency anemia due to chronic blood loss 05/06/2020   Low back pain 11/11/2021   Major depressive disorder, single episode    Mild intermittent asthma without complication 07/29/2020   Myopathy    Neck pain 11/11/2021   Neuropathy     Pain in right hand 08/09/2018   Polyarthralgia 09/30/2021   Reflux esophagitis    Sjogren syndrome, unspecified (HCC) 09/30/2021   Sjogren's disease (HCC)    Spinal stenosis of lumbar region 12/13/2017   Tenosynovitis of left hand 08/09/2018   Type 2 diabetes mellitus without complication, without long-term current use of insulin (HCC) 08/29/2019   Unilateral primary osteoarthritis, right knee 11/03/2021    Past Surgical History:  Procedure Laterality Date   ABDOMINAL HYSTERECTOMY     CARPAL TUNNEL RELEASE Left 2018   CERVICAL FUSION  10/07/2019   Anterior Cervical Discectomy due to spinal stenosis   CESAREAN SECTION     COLONOSCOPY     UPPER GASTROINTESTINAL ENDOSCOPY      Current Medications: Current Meds  Medication Sig   albuterol  (PROVENTIL ) (2.5 MG/3ML) 0.083% nebulizer solution Take 2.5 mg by nebulization every 6 (six) hours as needed for wheezing or shortness of breath.   ALPRAZolam  (XANAX ) 0.5 MG tablet Take 0.5 mg by mouth 3 (three) times daily as needed for anxiety.   amoxicillin -clavulanate (AUGMENTIN ) 875-125 MG tablet Take 1 tablet by mouth 2 (two) times daily.   Cholecalciferol (VITAMIN D3) 125 MCG (5000 UT) CAPS Take 5,000 Units by mouth daily.   DULoxetine  (CYMBALTA ) 60 MG capsule Take 120 mg by mouth daily.   Dupilumab  (DUPIXENT ) 300 MG/2ML SOAJ Inject 300 mg into the skin every 14 (fourteen) days.   ELIQUIS  5 MG TABS tablet TAKE 1 TABLET BY MOUTH TWICE A DAY   FARXIGA  10 MG TABS tablet Take  10 mg by mouth daily.   ferrous sulfate  325 (65 FE) MG tablet Take 325 mg by mouth daily with breakfast.   fluticasone -salmeterol (WIXELA INHUB) 500-50 MCG/ACT AEPB Inhale 1 puff into the lungs in the morning and at bedtime.   Fluticasone -Umeclidin-Vilant (TRELEGY ELLIPTA ) 200-62.5-25 MCG/ACT AEPB Inhale 1 puff into the lungs daily.   Folic Acid 0.8 MG CAPS Take 2 capsules by mouth daily.   furosemide  (LASIX ) 20 MG tablet Take 1 tablet (20 mg total) by mouth daily.    hydroxychloroquine  (PLAQUENIL ) 200 MG tablet Take 400 mg by mouth daily.   ipratropium (ATROVENT ) 0.03 % nasal spray Place 2 sprays into both nostrils 3 (three) times daily as needed for rhinitis.   levocetirizine (XYZAL ) 5 MG tablet Take 5 mg by mouth every evening.   metFORMIN (GLUCOPHAGE) 500 MG tablet Take 500 mg by mouth 2 (two) times daily with a meal.   methocarbamol  (ROBAXIN ) 500 MG tablet TAKE 1 TABLET BY MOUTH FOUR TIMES A DAY   metoprolol  succinate (TOPROL -XL) 25 MG 24 hr tablet Take 3 tablets (75 mg total) by mouth daily. Take 2 tablets in the morning and 1 tablet in the evening.   montelukast  (SINGULAIR ) 10 MG tablet Take 10 mg by mouth at bedtime.   NYSTATIN powder Apply 1 application  topically daily as needed (under breast/folds of skin).   OZEMPIC, 0.25 OR 0.5 MG/DOSE, 2 MG/3ML SOPN Inject 0.5 mg into the skin once a week.   pantoprazole  (PROTONIX ) 40 MG tablet Take 40 mg by mouth daily.   polyethylene glycol (MIRALAX  / GLYCOLAX ) 17 g packet Take 17 g by mouth daily as needed for mild constipation.   predniSONE  (DELTASONE ) 10 MG tablet Take 4 tablets (40 mg total) by mouth daily for 5 days, THEN 3 tablets (30 mg total) daily for 5 days, THEN 2 tablets (20 mg total) daily for 5 days, THEN 1 tablet (10 mg total) daily.   pregabalin  (LYRICA ) 150 MG capsule Take 150 mg by mouth 3 (three) times daily.   rosuvastatin  (CRESTOR ) 5 MG tablet Take 5 mg by mouth daily.   sacubitril -valsartan  (ENTRESTO ) 49-51 MG Take 1 tablet by mouth 2 (two) times daily.   spironolactone  (ALDACTONE ) 25 MG tablet Take 0.5 tablets (12.5 mg total) by mouth daily.   triamcinolone  (KENALOG ) 0.1 % paste Use as directed 1 Application in the mouth or throat 2 (two) times daily.     Allergies:   Ibuprofen and Lisinopril   Social History   Socioeconomic History   Marital status: Married    Spouse name: Not on file   Number of children: Not on file   Years of education: Not on file   Highest education level:  Not on file  Occupational History   Not on file  Tobacco Use   Smoking status: Never   Smokeless tobacco: Never  Vaping Use   Vaping status: Never Used  Substance and Sexual Activity   Alcohol use: No    Alcohol/week: 0.0 standard drinks of alcohol   Drug use: No   Sexual activity: Not on file  Other Topics Concern   Not on file  Social History Narrative   Not on file   Social Drivers of Health   Financial Resource Strain: Not on file  Food Insecurity: No Food Insecurity (07/20/2022)   Hunger Vital Sign    Worried About Running Out of Food in the Last Year: Never true    Ran Out of Food in the Last Year: Never  true  Transportation Needs: No Transportation Needs (07/20/2022)   PRAPARE - Administrator, Civil Service (Medical): No    Lack of Transportation (Non-Medical): No  Physical Activity: Not on file  Stress: Not on file  Social Connections: Not on file     Family History: The patient's family history includes Asthma in her mother; COPD in her mother; Colon polyps in her father and sister; Crohn's disease in her sister; Depression in her father; Diabetes in her father and mother; Diverticulitis in her sister; Heart disease in her father, maternal grandmother, and paternal grandmother; Hypercholesterolemia in her sister; Hypertension in her mother; Kidney disease in her brother and mother; Skin cancer in her father. There is no history of Colon cancer, Rectal cancer, or Stomach cancer. ROS:   Please see the history of present illness.    All 14 point review of systems negative except as described per history of present illness  EKGs/Labs/Other Studies Reviewed:         Recent Labs: 08/28/2023: BUN 11; Creatinine, Ser 0.89; NT-Pro BNP 202; Potassium 3.9; Sodium 146  Recent Lipid Panel    Component Value Date/Time   CHOL 122 07/20/2022 0459   TRIG 70 07/20/2022 0459   HDL 61 07/20/2022 0459   CHOLHDL 2.0 07/20/2022 0459   VLDL 14 07/20/2022 0459    LDLCALC 47 07/20/2022 0459    Physical Exam:    VS:  BP (!) 102/58   Pulse 77   Ht 4' 11 (1.499 m)   Wt 182 lb 6.4 oz (82.7 kg)   LMP  (LMP Unknown)   SpO2 97%   BMI 36.84 kg/m     Wt Readings from Last 3 Encounters:  05/17/24 182 lb 6.4 oz (82.7 kg)  04/01/24 178 lb (80.7 kg)  12/27/23 185 lb (83.9 kg)     GEN:  Well nourished, well developed in no acute distress HEENT: Normal NECK: No JVD; No carotid bruits LYMPHATICS: No lymphadenopathy CARDIAC: RRR, no murmurs, no rubs, no gallops RESPIRATORY:  Clear to auscultation without rales, wheezing or rhonchi  ABDOMEN: Soft, non-tender, non-distended MUSCULOSKELETAL:  No edema; No deformity  SKIN: Warm and dry LOWER EXTREMITIES: no swelling NEUROLOGIC:  Alert and oriented x 3 PSYCHIATRIC:  Normal affect   ASSESSMENT:    1. Dilated cardiomyopathy (HCC) ejection fraction percent based on done in November 2 023   2. Coronary artery disease involving native coronary artery of native heart without angina pectoris   3. Benign essential HTN   4. Moderate persistent asthma with exacerbation   5. Mixed dyslipidemia    PLAN:    In order of problems listed above:  Dilated cardiomyopathy will repeat echocardiogram in the meantime continue guideline directed medical therapy with Entresto  as well as other medications. Palpitations last time increased dose of metoprolol  and I am worried it may make her lung condition worse I asked her to temporarily go down with metoprolol  and see if there is any improvement if that is what the case is then we may need to choose different medication for her arrhythmia. Benign essential hypertension blood pressure actually on the lower side continue present management. Dyslipidemia.  I did review K PN which show me her LDL of 47 HDL 61 good control continue present management   Medication Adjustments/Labs and Tests Ordered: Current medicines are reviewed at length with the patient today.  Concerns  regarding medicines are outlined above.  No orders of the defined types were placed in this encounter.  Medication changes: No orders of the defined types were placed in this encounter.   Signed, Crystal DOROTHA Fitch, MD, [facility] 05/17/2024 4:49 PM    [facility]

## 2024-05-17 NOTE — Patient Instructions (Signed)
 Medication Instructions:  Your physician recommends that you continue on your current medications as directed. Please refer to the Current Medication list given to you today.  *If you need a refill on your cardiac medications before your next appointment, please call your pharmacy*   Lab Work: None Ordered If you have labs (blood work) drawn today and your tests are completely normal, you will receive your results only by: MyChart Message (if you have MyChart) OR A paper copy in the mail If you have any lab test that is abnormal or we need to change your treatment, we will call you to review the results.   Testing/Procedures: Your physician has requested that you have an echocardiogram. Echocardiography is a painless test that uses sound waves to create images of your heart. It provides your doctor with information about the size and shape of your heart and how well your heart's chambers and valves are working. This procedure takes approximately one hour. There are no restrictions for this procedure. Please do NOT wear cologne, perfume, aftershave, or lotions (deodorant is allowed). Please arrive 15 minutes prior to your appointment time.  Please note: We ask at that you not bring children with you during ultrasound (echo/ vascular) testing. Due to room size and safety concerns, children are not allowed in the ultrasound rooms during exams. Our front office staff cannot provide observation of children in our lobby area while testing is being conducted. An adult accompanying a patient to their appointment will only be allowed in the ultrasound room at the discretion of the ultrasound technician under special circumstances. We apologize for any inconvenience.    Follow-Up: At [facility], you and your health needs are our priority.  As part of our continuing mission to provide you with exceptional heart care, we have created designated Provider Care Teams.  These Care Teams include your  primary Cardiologist (physician) and Advanced Practice Providers (APPs -  Physician Assistants and Nurse Practitioners) who all work together to provide you with the care you need, when you need it.  We recommend signing up for the patient portal called "MyChart".  Sign up information is provided on this After Visit Summary.  MyChart is used to connect with patients for Virtual Visits (Telemedicine).  Patients are able to view lab/test results, encounter notes, upcoming appointments, etc.  Non-urgent messages can be sent to your provider as well.   To learn more about what you can do with MyChart, go to [email or web address].    Your next appointment:   5 month(s)  The format for your next appointment:   In Person  Provider:   Gypsy Balsam, MD    Other Instructions NA

## 2024-05-21 ENCOUNTER — Other Ambulatory Visit: Payer: Self-pay | Admitting: Pharmacy Technician

## 2024-05-21 ENCOUNTER — Other Ambulatory Visit (HOSPITAL_COMMUNITY): Payer: Self-pay

## 2024-05-21 ENCOUNTER — Other Ambulatory Visit: Payer: Self-pay

## 2024-05-21 NOTE — Progress Notes (Signed)
 Specialty Pharmacy Refill Coordination Note  Crystal Marquez is a 63 y.o. female contacted today regarding refills of specialty medication(s) Dupilumab  (Dupixent )   Patient requested Delivery   Delivery date: 05/28/24   Verified address: [address]   [name] [id number]   Medication will be filled on 05/27/24.  Injection due: 9/12

## 2024-05-27 ENCOUNTER — Other Ambulatory Visit: Payer: Self-pay

## 2024-06-01 ENCOUNTER — Other Ambulatory Visit: Payer: Self-pay | Admitting: Cardiology

## 2024-06-01 DIAGNOSIS — I480 Paroxysmal atrial fibrillation: Secondary | ICD-10-CM

## 2024-06-03 ENCOUNTER — Encounter: Payer: Self-pay | Admitting: Pulmonary Disease

## 2024-06-03 ENCOUNTER — Ambulatory Visit: Admitting: Pulmonary Disease

## 2024-06-03 ENCOUNTER — Other Ambulatory Visit (HOSPITAL_COMMUNITY): Payer: Self-pay

## 2024-06-03 ENCOUNTER — Other Ambulatory Visit: Payer: Self-pay

## 2024-06-03 VITALS — BP 106/80 | HR 66 | Temp 97.7°F | Ht 59.0 in | Wt 180.0 lb

## 2024-06-03 DIAGNOSIS — J4550 Severe persistent asthma, uncomplicated: Secondary | ICD-10-CM

## 2024-06-03 DIAGNOSIS — R0609 Other forms of dyspnea: Secondary | ICD-10-CM | POA: Diagnosis not present

## 2024-06-03 DIAGNOSIS — J398 Other specified diseases of upper respiratory tract: Secondary | ICD-10-CM | POA: Diagnosis not present

## 2024-06-03 MED ORDER — DUPIXENT 300 MG/2ML ~~LOC~~ SOAJ
300.0000 mg | SUBCUTANEOUS | 12 refills | Status: DC
  Filled 2024-06-03: qty 2, 14d supply, fill #0
  Filled 2024-06-19: qty 4, 28d supply, fill #0
  Filled 2024-07-17: qty 4, 28d supply, fill #1
  Filled 2024-08-12 – 2024-08-20 (×2): qty 4, 28d supply, fill #2
  Filled 2024-09-11: qty 4, 28d supply, fill #3
  Filled 2024-10-04 – 2024-10-21 (×6): qty 4, 28d supply, fill #4

## 2024-06-03 MED ORDER — PREDNISONE 10 MG PO TABS: 10.0000 mg | ORAL_TABLET | Freq: Every day | ORAL | 4 refills | Status: AC

## 2024-06-03 NOTE — Progress Notes (Signed)
 @Patient  ID: Crystal Marquez, female    DOB: 1961-05-17, 63 y.o.   MRN: [id number]  Chief Complaint  Patient presents with   Asthma    Poorly contolled    Cough    Coughing up yellow mucus. Comes and goes    Shortness of Breath    Referring provider: Silvano Angeline FALCON, NP  HPI:   63 y.o. woman whom we are seeing in follow up for dyspnea, wheezing felt to be related to asthma. Multiple telephone notes from our office reviewed.  Returns for scheduled follow-up.  Continues on Dupixent .  Continues Trelegy.  Given recurrent exacerbations despite aggressive, most aggressive treatment.  CT high-resolution was pursued to evaluate for possible tracheobronchomalacia.  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  We discussed this at length.  Typical treatment options.  Consideration of referral to academic center for further evaluation.  HPI at initial visit: Patient in normal state of health.  Got COVID October 2022.  Since then she is had intermittent issues with shortness of breath, wheezing.  Occasional cough.  Sometimes worse in the evenings.  Worse with exertion although can occur at rest as well.  The feeling of inability get deep breath, mild chest tightness.  Seems to come and go.  Some days are better than others.  No other seasonal environmental factors he can identify to make things better or worse.  Mild relief with prednisone , albuterol .  However after prednisone  symptoms returned.  She reports history of recurrent bronchitis usually at least once a year sometimes twice a year with season changes.  Reviewed CT chest 08/23/2021 reveals clear lungs bilaterally on my review interpretation.  Reviewed echocardiogram 09/02/2021 that shows reduction in EF to 40% as well as grade 2 diastolic dysfunction and dilated left atrium, moderate MVR, normal RA size and normal RV function and size.   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   Questionaires / Pulmonary Flowsheets:   ACT:  Asthma Control Test ACT Total Score  06/03/2024 10:10 AM 12  04/01/2024  9:53 AM 12  12/26/2022  9:39 AM 13    MMRC:     No data to display          Epworth:      No data to display          Tests:   FENO:  No results found for: NITRICOXIDE  PFT:    Latest Ref Rng & Units 08/15/2022    3:48 PM  PFT Results  FVC-Pre L 2.18   FVC-Predicted Pre % 79   FVC-Post L 2.30   FVC-Predicted Post % 83   Pre FEV1/FVC % % 85   Post FEV1/FCV % % 88   FEV1-Pre L 1.86   FEV1-Predicted Pre % 87   FEV1-Post L 2.02   DLCO uncorrected ml/min/mmHg 19.17   DLCO UNC% % 112   DLCO corrected ml/min/mmHg 18.06   DLCO COR %Predicted % 106   DLVA Predicted % 121   TLC L 3.98   TLC % Predicted % 92   RV % Predicted % 43   Personally reviewed interpreted as normal spirometry, no bronchodilator spots, lung wise within normal notes,  DLCO within normal limits.  WALK:      No data to display          Imaging: Personally reviewed and as per EMR discussion this note CT Chest High Resolution Result Date: 05/14/2024 CLINICAL DATA:  Persistent wheezing. Evaluate for tracheal bronchomalacia. EXAM: CT CHEST WITHOUT CONTRAST TECHNIQUE: Multidetector CT imaging of the chest was performed following the standard protocol without intravenous contrast. High resolution imaging of the lungs, as well as inspiratory and expiratory imaging, was performed. RADIATION DOSE REDUCTION: This exam was performed according to the departmental dose-optimization program which includes automated exposure  control, adjustment of the mA and/or kV according to patient size and/or use of iterative reconstruction technique. COMPARISON:  08/23/2021. FINDINGS: Cardiovascular: Atherosclerotic calcification of the aorta with age advanced involvement of the left anterior descending and circumflex coronary arteries. Enlarged pulmonic trunk and heart. No pericardial effusion. Mediastinum/Nodes: No pathologically enlarged mediastinal or axillary lymph nodes. Hilar regions are difficult to definitively evaluate without IV contrast. Esophagus is grossly unremarkable. Lungs/Pleura: Negative for subpleural reticulation, traction bronchiectasis/bronchiolectasis, ground glass, architectural distortion or honeycombing. Approximately 85% collapse of the airway on expiratory phase imaging. There is air trapping. A few scattered tiny pulmonary nodules are unchanged. Per Fleischner Society guidelines, no follow-up is necessary. Scarring in the right middle lobe. No pleural fluid. Airway is otherwise unremarkable. Upper Abdomen: Liver is slightly decreased in attenuation diffusely with sparing along the gallbladder fossa. Visualized portions of the liver, gallbladder, adrenal glands, kidneys, spleen, pancreas, stomach and bowel are otherwise grossly unremarkable. No upper abdominal adenopathy. Musculoskeletal: Degenerative changes in the spine. IMPRESSION: 1. Excessive dynamic airway collapse, measuring approximately 85% on expiratory phase imaging. 2. No evidence of interstitial lung disease. Air trapping is indicative of small airways disease. 3. Hepatic steatosis. 4.  Age advanced two vessel coronary artery calcification. 5.  Aortic atherosclerosis (ICD10-I70.0). 6. Enlarged pulmonic trunk, indicative of pulmonary arterial hypertension. Electronically Signed   By: Newell Eke M.D.   On: 05/14/2024 17:48     Lab Results: Personally reviewed  CBC    Component Value Date/Time   WBC 12.2 (H) 10/20/2022 0015   RBC 4.93 10/20/2022  0015   HGB 14.6 10/20/2022 0015   HGB 15.6 07/28/2022 1603   HCT 45.5 10/20/2022 0015   HCT 46.6 07/28/2022 1603   PLT 163 10/20/2022 0015   PLT 178 07/28/2022 1603   MCV 92.3 10/20/2022 0015   MCV 88 07/28/2022 1603   MCH 29.6 10/20/2022 0015   MCHC 32.1 10/20/2022 0015   RDW 13.9 10/20/2022 0015   RDW 14.1 07/28/2022 1603   LYMPHSABS 2.7 10/20/2022 0015   LYMPHSABS 1.6 07/28/2022 1603   MONOABS 0.9 10/20/2022 0015   EOSABS 0.2 10/20/2022 0015   EOSABS 0.4 07/28/2022 1603   BASOSABS 0.0 10/20/2022 0015   BASOSABS 0.0 07/28/2022 1603    BMET    Component Value Date/Time   NA 146 (H) 08/28/2023 1211   K 3.9 08/28/2023 1211   CL 106 08/28/2023 1211   CO2 23 08/28/2023 1211   GLUCOSE 105 (H) 08/28/2023 1211   GLUCOSE 134 (H) 10/20/2022 0015   BUN 11 08/28/2023 1211   CREATININE 0.89 08/28/2023 1211   CALCIUM  9.0 08/28/2023 1211   GFRNONAA >60 10/20/2022 0015    BNP    Component Value Date/Time   BNP 104.2 (H) 10/20/2022 0015    ProBNP    Component Value Date/Time   PROBNP 202 08/28/2023 1211    Specialty Problems  Pulmonary Problems   Streptococcal sore throat   Streptococcal pharyngitis      Chronic cough   CoughCough; Start Date : 06/18/2018Cough; Start Date : 10/19/2017Cough; Start Date : 03/15/2016Cough; Start Date : 07/18/2014      Pain in throat   Pain in throat      Mild intermittent asthma without complication   Allergic rhinitis   Asthma   Pneumonia   Other pneumonia, unspecified organism      Dyspnea on exertion   Acute cough   Acute cough      Wheezing   Wheezing      Acute sinusitis   Acute sinusitis, unspecifiedAcute sinusitis, unspecified; Start Date : 10/24/2023Acute sinusitis, unspecified; Start Date : 06/18/2018Acute sinusitis, unspecified; Start Date : 05/26/2016Acute sinusitis, unspecified; Start Date : 05/16/2016Acute sinusitis, unspecified; Start Date : 03/15/2016Acute sinusitis, unspecified; Start Date  : 10/30/2015Acute sinusitis, unspecified; Start Date : 07/15/2014      Vasomotor rhinitis   Moderate persistent asthma with exacerbation   Moderate persistent asthma with (acute) exacerbationModerate persistent asthma with (acute) exacerbation; Start Date : 05/06/2024Moderate persistent asthma with (acute) exacerbation; Start Date : 05/04/2022      Steroid-dependent asthma    Allergies  Allergen Reactions   Ibuprofen Rash   Lisinopril Shortness Of Breath    Bloating     Immunization History  Administered Date(s) Administered   Influenza,inj,Quad PF,6+ Mos 10/31/2022   Influenza,inj,quad, With Preservative 06/19/2017   Influenza-Unspecified 06/28/2021   PFIZER(Purple Top)SARS-COV-2 Vaccination 12/19/2019, 01/13/2020    Past Medical History:  Diagnosis Date   Age-related osteoporosis without current pathological fracture    Allergic rhinitis    Anxiety    Asthma    B12 deficiency 05/06/2020   Benign essential HTN 08/29/2019   Bronchitis    a week ago from 9/15   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   Chronic kidney disease    Chronic pain syndrome    Class 2 obesity due to excess calories without serious comorbidity with body mass index (BMI) of 36.0 to 36.9 in adult 08/29/2019   Colitis 05/06/2020   Colon polyp    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HCC) 05/06/2020   Coronary atherosclerosis 10/06/2021   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   Dyslipidemia 08/29/2019   Dyspnea on exertion 10/06/2021   Fibromyalgia 09/30/2021   GERD (gastroesophageal reflux disease)    Hypercalcemia    Hypertension    Iron deficiency anemia due to chronic blood loss 05/06/2020   Low back pain 11/11/2021   Major depressive disorder, single episode    Mild intermittent asthma without complication 07/29/2020   Myopathy    Neck pain 11/11/2021   Neuropathy    Pain in right hand 08/09/2018   Polyarthralgia 09/30/2021   Reflux esophagitis    Sjogren syndrome,  unspecified (HCC) 09/30/2021   Sjogren's disease (HCC)    Spinal stenosis of lumbar region 12/13/2017   Tenosynovitis of left hand 08/09/2018   Type 2 diabetes mellitus without complication, without long-term current use of insulin (HCC) 08/29/2019   Unilateral primary osteoarthritis, right knee 11/03/2021    Tobacco History: Social History   Tobacco Use  Smoking Status Never  Smokeless Tobacco Never   Counseling given: Not Answered   Continue to not smoke  Outpatient Encounter Medications as of 06/03/2024  Medication Sig   albuterol  (PROVENTIL ) (2.5 MG/3ML) 0.083% nebulizer solution Take 2.5 mg by nebulization every 6 (six) hours as needed for wheezing or shortness of breath.   ALPRAZolam  (XANAX ) 0.5 MG  tablet Take 0.5 mg by mouth 3 (three) times daily as needed for anxiety.   Cholecalciferol (VITAMIN D3) 125 MCG (5000 UT) CAPS Take 5,000 Units by mouth daily.   DULoxetine  (CYMBALTA ) 60 MG capsule Take 120 mg by mouth daily.   ELIQUIS  5 MG TABS tablet TAKE 1 TABLET BY MOUTH TWICE A DAY   FARXIGA  10 MG TABS tablet Take 10 mg by mouth daily.   Fluticasone -Umeclidin-Vilant (TRELEGY ELLIPTA ) 200-62.5-25 MCG/ACT AEPB Inhale 1 puff into the lungs daily.   Folic Acid 0.8 MG CAPS Take 2 capsules by mouth daily.   furosemide  (LASIX ) 20 MG tablet Take 1 tablet (20 mg total) by mouth daily.   hydroxychloroquine  (PLAQUENIL ) 200 MG tablet Take 400 mg by mouth daily.   metFORMIN (GLUCOPHAGE) 500 MG tablet Take 500 mg by mouth 2 (two) times daily with a meal.   methocarbamol  (ROBAXIN ) 500 MG tablet TAKE 1 TABLET BY MOUTH FOUR TIMES A DAY   metoprolol  succinate (TOPROL -XL) 25 MG 24 hr tablet Take 3 tablets (75 mg total) by mouth daily. Take 2 tablets in the morning and 1 tablet in the evening.   montelukast  (SINGULAIR ) 10 MG tablet Take 10 mg by mouth at bedtime.   NYSTATIN powder Apply 1 application  topically daily as needed (under breast/folds of skin).   OZEMPIC, 0.25 OR 0.5 MG/DOSE, 2  MG/3ML SOPN Inject 0.5 mg into the skin once a week.   pantoprazole  (PROTONIX ) 40 MG tablet Take 40 mg by mouth daily.   polyethylene glycol (MIRALAX  / GLYCOLAX ) 17 g packet Take 17 g by mouth daily as needed for mild constipation.   pregabalin  (LYRICA ) 150 MG capsule Take 150 mg by mouth 3 (three) times daily.   rosuvastatin  (CRESTOR ) 5 MG tablet Take 5 mg by mouth daily.   sacubitril -valsartan  (ENTRESTO ) 49-51 MG Take 1 tablet by mouth 2 (two) times daily.   spironolactone  (ALDACTONE ) 25 MG tablet Take 0.5 tablets (12.5 mg total) by mouth daily.   triamcinolone  (KENALOG ) 0.1 % paste Use as directed 1 Application in the mouth or throat 2 (two) times daily.   [DISCONTINUED] Dupilumab  (DUPIXENT ) 300 MG/2ML SOAJ Inject 300 mg into the skin every 14 (fourteen) days.   Dupilumab  (DUPIXENT ) 300 MG/2ML SOAJ Inject 300 mg into the skin every 14 (fourteen) days.   predniSONE  (DELTASONE ) 10 MG tablet Take 1 tablet (10 mg total) by mouth daily.   [DISCONTINUED] amoxicillin -clavulanate (AUGMENTIN ) 875-125 MG tablet Take 1 tablet by mouth 2 (two) times daily. (Patient not taking: Reported on 06/03/2024)   [DISCONTINUED] ELIQUIS  5 MG TABS tablet TAKE 1 TABLET BY MOUTH TWICE A DAY   [DISCONTINUED] ferrous sulfate  325 (65 FE) MG tablet Take 325 mg by mouth daily with breakfast. (Patient not taking: Reported on 06/03/2024)   [DISCONTINUED] fluticasone -salmeterol (WIXELA INHUB) 500-50 MCG/ACT AEPB Inhale 1 puff into the lungs in the morning and at bedtime. (Patient not taking: Reported on 06/03/2024)   [DISCONTINUED] ipratropium (ATROVENT ) 0.03 % nasal spray Place 2 sprays into both nostrils 3 (three) times daily as needed for rhinitis. (Patient not taking: Reported on 06/03/2024)   [DISCONTINUED] levocetirizine (XYZAL ) 5 MG tablet Take 5 mg by mouth every evening. (Patient not taking: Reported on 06/03/2024)   [DISCONTINUED] predniSONE  (DELTASONE ) 10 MG tablet Take 4 tablets (40 mg total) by mouth daily for 5 days, THEN  3 tablets (30 mg total) daily for 5 days, THEN 2 tablets (20 mg total) daily for 5 days, THEN 1 tablet (10 mg total) daily.  No facility-administered encounter medications on file as of 06/03/2024.     Review of Systems  Review of Systems  N/a Physical Exam  BP 106/80 (BP Location: Right Arm, Patient Position: Sitting, Cuff Size: Normal)   Pulse 66   Temp 97.7 F (36.5 C) (Oral)   Ht 4' 11 (1.499 m)   Wt 180 lb (81.6 kg)   LMP  (LMP Unknown)   SpO2 99%   BMI 36.36 kg/m   Wt Readings from Last 5 Encounters:  06/03/24 180 lb (81.6 kg)  05/17/24 182 lb 6.4 oz (82.7 kg)  04/01/24 178 lb (80.7 kg)  12/27/23 185 lb (83.9 kg)  11/07/23 183 lb (83 kg)    BMI Readings from Last 5 Encounters:  06/03/24 36.36 kg/m  05/17/24 36.84 kg/m  04/01/24 35.35 kg/m  12/27/23 37.37 kg/m  11/07/23 36.96 kg/m     Physical Exam General: Well-appearing, sitting in chair Eyes: EOMI, icterus Neck: Supple, no JVP Pulmonary: Clear, no wheeze Cardiovascular: Regular in rhythm, no murmur Abdomen: Nondistended, bowel sounds present MSK: No synovitis, no joint effusion Neuro: Normal gait, no weakness Psych: Normal mood, full affect   Assessment & Plan:   Dyspnea on exertion: Likely multifactorial.  Contribution of cardiac causes given her reduced EF to 45% as well as left atrial dilation and MVR and diastolic dysfunction on prior TTE, most recent TTE shows normalization of EF.  Fortunately right side looks reassuring in terms of pulmonary hypertension.  Additionally, high suspicion for poorly controlled asthma especially with exacerbation or worsening since COVID infection 06/2021.  Mild improvement with inhalers, biologic therapies.  Now with demonstration of tracheobronchomalacia which likely mimics asthma symptoms and certainly a significant contributor.  Severe persistent asthma with frequent exacerbations, now steroid-dependent: Previous diagnosis.  Suspicious for uncontrolled asthma  given worsening in symptoms after COVID infection, post viral.  Disease control was adequate on Trelegy high-dose.  However, no longer covered by insurance.  On Incruse and Wixela.  Still poorly controlled symptoms.  [place]  10/2022.  No real improvement with frequent exacerbations essentially steroid-dependent.  This medication was stopped and Dupixent  started first injection 06/2023.  Mild improvement in symptoms since starting Dupixent .  Still with frequent exacerbations.  Continue triple inhaled therapy with Trelegy.  Continue Dupixent .  Continue prednisone  10 mg daily.   Tracheobronchomalacia: CT scan high-resolution 04/2024 demonstrates significant tracheobronchomalacia.  Likely contributor symptoms mimicking asthma symptoms.  Referral to [facility] for further evaluation.  We discussed at length treatment is difficult and may not be an option.  Even if it is an option, requires a lot of follow-up etc   Return in about 4 months (around 10/03/2024) for f/u Dr. Annella.   Donnice JONELLE Annella, MD 06/03/2024

## 2024-06-03 NOTE — Patient Instructions (Addendum)
 Continue Trelegy  Continue prednisone   Continue Dupixent   Return to clinic in 4 months or sooner as needed with Dr. Annella

## 2024-06-03 NOTE — Telephone Encounter (Signed)
 Eliquis  5mg  refill request received. Patient is 63 years old, weight-82.7kg, Crea-0.89 on 08/28/23, Diagnosis-Afib, and last seen by Dr. Bernie on 05/17/24. Dose is appropriate based on dosing criteria. Will send in refill to requested pharmacy.

## 2024-06-14 ENCOUNTER — Ambulatory Visit

## 2024-06-14 ENCOUNTER — Other Ambulatory Visit: Payer: Self-pay | Admitting: Cardiology

## 2024-06-14 DIAGNOSIS — I5042 Chronic combined systolic (congestive) and diastolic (congestive) heart failure: Secondary | ICD-10-CM

## 2024-06-14 DIAGNOSIS — I5022 Chronic systolic (congestive) heart failure: Secondary | ICD-10-CM

## 2024-06-17 LAB — ECHOCARDIOGRAM COMPLETE
AV Vena cont: 0.2 cm
Area-P 1/2: 3.22 cm2
MV M vel: 4.28 m/s
MV Peak grad: 73.3 mmHg
P 1/2 time: 580 ms
S' Lateral: 3.2 cm

## 2024-06-19 ENCOUNTER — Other Ambulatory Visit (HOSPITAL_BASED_OUTPATIENT_CLINIC_OR_DEPARTMENT_OTHER): Payer: Self-pay

## 2024-06-19 ENCOUNTER — Encounter (HOSPITAL_BASED_OUTPATIENT_CLINIC_OR_DEPARTMENT_OTHER): Payer: Self-pay

## 2024-06-19 ENCOUNTER — Other Ambulatory Visit: Payer: Self-pay

## 2024-06-19 ENCOUNTER — Ambulatory Visit (HOSPITAL_BASED_OUTPATIENT_CLINIC_OR_DEPARTMENT_OTHER)
Admission: EM | Admit: 2024-06-19 | Discharge: 2024-06-19 | Disposition: A | Discharge status: Home / Self Care | Attending: Family Medicine | Admitting: Family Medicine

## 2024-06-19 ENCOUNTER — Ambulatory Visit (INDEPENDENT_AMBULATORY_CARE_PROVIDER_SITE_OTHER): Admit: 2024-06-19 | Discharge: 2024-06-19 | Disposition: A | Discharge status: Home / Self Care | Admitting: Radiology

## 2024-06-19 ENCOUNTER — Ambulatory Visit: Payer: Self-pay | Admitting: Cardiology

## 2024-06-19 DIAGNOSIS — R051 Acute cough: Secondary | ICD-10-CM

## 2024-06-19 DIAGNOSIS — J398 Other specified diseases of upper respiratory tract: Secondary | ICD-10-CM

## 2024-06-19 DIAGNOSIS — R079 Chest pain, unspecified: Secondary | ICD-10-CM | POA: Diagnosis not present

## 2024-06-19 DIAGNOSIS — M549 Dorsalgia, unspecified: Secondary | ICD-10-CM

## 2024-06-19 DIAGNOSIS — J4551 Severe persistent asthma with (acute) exacerbation: Secondary | ICD-10-CM

## 2024-06-19 DIAGNOSIS — R0602 Shortness of breath: Secondary | ICD-10-CM | POA: Diagnosis not present

## 2024-06-19 LAB — POC SOFIA SARS ANTIGEN FIA: SARS Coronavirus 2 Ag: NEGATIVE

## 2024-06-19 MED ORDER — PREDNISONE 20 MG PO TABS
ORAL_TABLET | ORAL | 0 refills | Status: AC
  Filled 2024-06-19: qty 9, 6d supply, fill #0

## 2024-06-19 NOTE — Discharge Instructions (Addendum)
 Acute asthma exacerbation with severe tracheomalacia and cough: Rapid COVID is negative.  Chest x-ray appears normal or negative for pneumonia.  Exam shows moderate inspiratory wheezing.  Continue Dupixent  and Trelegy.  Hold prednisone  10 mg daily for now.  Take prednisone20 mg, #2 pills (40 mg dose) daily x 3 days, then take 20 mg, #1 pill (20 mg dose) daily x 3 days.  Once completed resume prednisone  10 mg daily.  Use albuterol  inhaler or nebulizer as needed.  Follow-up with pulmonology as needed.

## 2024-06-19 NOTE — ED Provider Notes (Signed)
 [facility]    CSN: [id number] Arrival date & time: 06/19/24  1311      History   Chief Complaint Chief Complaint  Patient presents with   Shortness of Breath    HPI Crystal Marquez is a 63 y.o. female.   63 year old female with a long history of asthma who has dyspnea and wheezing chronically.  Currently she is short of breath with any activity of any kind.  Since 06/16/2024, she has felt worsening chest congestion and shortness of breath.  She is on prednisone  10 mg daily, Dupixent  and Trelegy.  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  Pulmonology is considering referral to academic center for further evaluation.  Currently her symptoms are worsening since 06/16/2024.  Her nebulizers and inhalers help her symptoms some.  She is here because she is progressively short of breath with any activity of any kind.  Her chest CT was done on 05/13/2024.   Shortness of Breath Associated symptoms: cough and wheezing   Associated symptoms: no abdominal pain, no chest pain, no ear pain, no fever, no rash, no sore throat and no vomiting     Past Medical History:  Diagnosis Date   Age-related osteoporosis without current pathological fracture    Allergic rhinitis    Anxiety    Asthma    B12 deficiency 05/06/2020   Benign essential HTN 08/29/2019   Bronchitis    a week ago from 9/15   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   Chronic kidney disease    Chronic pain syndrome    Class 2 obesity due to excess calories without serious comorbidity with body mass index (BMI) of 36.0 to 36.9 in adult 08/29/2019   Colitis 05/06/2020   Colon polyp    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HCC) 05/06/2020   Coronary atherosclerosis 10/06/2021   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   Dyslipidemia 08/29/2019   Dyspnea on exertion 10/06/2021   Fibromyalgia 09/30/2021   GERD  (gastroesophageal reflux disease)    Hypercalcemia    Hypertension    Iron deficiency anemia due to chronic blood loss 05/06/2020   Low back pain 11/11/2021   Major depressive disorder, single episode    Mild intermittent asthma without complication 07/29/2020   Myopathy    Neck pain 11/11/2021   Neuropathy    Pain in right hand 08/09/2018   Polyarthralgia 09/30/2021   Reflux esophagitis    Sjogren syndrome, unspecified 09/30/2021   Sjogren's disease    Spinal stenosis of lumbar region 12/13/2017   Tenosynovitis of left hand 08/09/2018   Type 2 diabetes mellitus without complication, without long-term current use of insulin (HCC) 08/29/2019   Unilateral primary osteoarthritis, right knee 11/03/2021    Patient Active Problem List   Diagnosis Date Noted   Persistent insomnia 05/09/2024   Mixed hyperlipidemia 04/08/2024   Rash 03/18/2024   Aphthous ulcer of mouth 03/07/2024   Sjogren's syndrome with myopathy 02/18/2024   Xerostomia 02/08/2024   Infection due to Staphylococcus aureus 02/02/2024   Aphthous ulcer 01/25/2024   Hyperlipidemia 01/25/2024   Steroid-dependent asthma 09/27/2023   Leukocytosis 08/07/2023   Other dietary vitamin B12 deficiency anemia 07/31/2023   Vitamin D  deficiency 07/31/2023   Body mass index (BMI) 38.0-38.9, adult 04/27/2023   Constipation 04/27/2023   Moderate persistent asthma with exacerbation 04/27/2023   Vasomotor rhinitis 03/28/2023   Abnormal finding on thyroid function test 11/29/2022   Acute sinusitis  10/25/2022   Contact with and (suspected) exposure to other viral communicable diseases 10/25/2022   Wheezing 10/13/2022   Paroxysmal atrial fibrillation (HCC) 07/28/2022   Dilated cardiomyopathy (HCC) ejection fraction percent based on done in November 2 023 07/28/2022   Chest pain 07/20/2022   Unstable angina (HCC)    Chronic systolic congestive heart failure (HCC)    COVID-19 07/12/2022   Fatigue 07/12/2022   Palpitations 07/12/2022    Acute cough 05/04/2022   Encounter for allergy  testing 05/04/2022   Mixed dyslipidemia 04/05/2022   Encounter for follow-up examination after completed treatment for conditions other than malignant neoplasm 02/03/2022   Fall on same level from slipping, tripping and stumbling with subsequent striking against unspecified object, initial encounter 02/03/2022   Neck pain 11/11/2021   Low back pain 11/11/2021   Unilateral primary osteoarthritis, right knee 11/03/2021   Coronary artery disease 0 to 25% stenosis of LAD based on coronary CT angio 2023 10/06/2021   Dyspnea on exertion 10/06/2021   Polyarthralgia 09/30/2021   Sjogren syndrome, unspecified 09/30/2021   Fibromyalgia 09/30/2021   Right knee pain 09/28/2021   Age-related osteoporosis without current pathological fracture 09/09/2021   Allergic rhinitis 09/09/2021   Anxiety 09/09/2021   Asthma 09/09/2021   Chronic kidney disease 09/09/2021   Chronic pain syndrome 09/09/2021   Colon polyp 09/09/2021   GERD (gastroesophageal reflux disease) 09/09/2021   Hypercalcemia 09/09/2021   Hypertension 09/09/2021   Major depressive disorder, single episode 09/09/2021   Myopathy 09/09/2021   Neuropathy 09/09/2021   Reflux esophagitis 09/09/2021   Sjogren's disease 09/09/2021   Congestive heart failure (HCC) 09/09/2021   Pneumonia 09/09/2021   Post covid-19 condition, unspecified 08/06/2021   Long term current use of therapeutic drug 12/04/2020   Mild intermittent asthma without complication 07/29/2020   B12 deficiency 05/06/2020   Colitis 05/06/2020   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HCC) 05/06/2020   Iron deficiency anemia due to chronic blood loss 05/06/2020   Dysuria 09/11/2019   Urinary tract infectious disease 09/11/2019   Benign essential HTN 08/29/2019   Class 2 obesity due to excess calories without serious comorbidity with body mass index (BMI) of 36.0 to 36.9 in adult 08/29/2019   Dyslipidemia 08/29/2019   Type 2 diabetes mellitus  without complication, without long-term current use of insulin (HCC) 08/29/2019   Salmonella gastroenteritis 07/12/2019   Chronic cough 05/20/2019   Pain in throat 05/20/2019   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   Pain in right hand 08/09/2018   Tenosynovitis of left hand 08/09/2018   Spinal stenosis of lumbar region 12/13/2017   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   Streptococcal sore throat 11/15/2017   Cellulitis of left toe 08/25/2017   Ingrown nail 08/24/2017   Lower abdominal pain 04/03/2017   Hemorrhoids 01/26/2017   Sebaceous cyst 01/26/2017   Other specified abnormal findings of blood chemistry 07/06/2016   Amnesia 03/29/2016   New daily persistent headache 03/29/2016   Generalized edema 05/15/2015   Leukocytes in urine 04/14/2015   Pain in right foot 01/22/2015   Hypokalemia 12/31/2014   Unspecified fall, initial encounter 11/10/2014   Renal colic 07/01/2014    Past Surgical History:  Procedure Laterality Date   ABDOMINAL HYSTERECTOMY     CARPAL TUNNEL RELEASE Left 2018   CERVICAL FUSION  10/07/2019   Anterior Cervical Discectomy due to spinal stenosis   CESAREAN SECTION     COLONOSCOPY     UPPER GASTROINTESTINAL ENDOSCOPY      OB History  No obstetric history on file.      Home Medications    Prior to Admission medications   Medication Sig Start Date End Date Taking? Authorizing Provider  predniSONE  (DELTASONE ) 20 MG tablet Take 20 mg, #2 pills (40 mg dose) daily x 3 days, then take 20 mg, #1 pill (20 mg dose) daily x 3 days. 06/19/24  Yes Ival Domino, FNP  albuterol  (PROVENTIL ) (2.5 MG/3ML) 0.083% nebulizer solution Take 2.5 mg by nebulization every 6 (six) hours as needed for wheezing or shortness of breath.    [provider]  ALPRAZolam  (XANAX ) 0.5 MG tablet Take 0.5 mg by mouth 3 (three) times daily as needed for anxiety. 03/15/22   [provider]  Cholecalciferol (VITAMIN D3) 125 MCG (5000 UT) CAPS  Take 5,000 Units by mouth daily.    [provider]  DULoxetine  (CYMBALTA ) 60 MG capsule Take 120 mg by mouth daily. 06/20/21   [provider]  Dupilumab  (DUPIXENT ) 300 MG/2ML SOAJ Inject 300 mg into the skin every 14 (fourteen) days. 06/03/24   Hunsucker, Donnice SAUNDERS, MD  ELIQUIS  5 MG TABS tablet TAKE 1 TABLET BY MOUTH TWICE A DAY 06/03/24   Krasowski, Robert J, MD  FARXIGA  10 MG TABS tablet Take 10 mg by mouth daily. 07/09/22   [provider]  Fluticasone -Umeclidin-Vilant (TRELEGY ELLIPTA ) 200-62.5-25 MCG/ACT AEPB Inhale 1 puff into the lungs daily. 09/27/23   Hunsucker, Donnice SAUNDERS, MD  Folic Acid 0.8 MG CAPS Take 2 capsules by mouth daily.    [provider]  furosemide  (LASIX ) 20 MG tablet Take 1 tablet (20 mg total) by mouth daily. 12/14/23   Krasowski, Robert J, MD  hydroxychloroquine  (PLAQUENIL ) 200 MG tablet Take 400 mg by mouth daily. 10/17/14   [provider]  metFORMIN (GLUCOPHAGE) 500 MG tablet Take 500 mg by mouth 2 (two) times daily with a meal.    [provider]  methocarbamol  (ROBAXIN ) 500 MG tablet TAKE 1 TABLET BY MOUTH FOUR TIMES A DAY 11/17/23   Gretta Bertrum ORN, PA-C  metoprolol  succinate (TOPROL -XL) 25 MG 24 hr tablet Take 3 tablets (75 mg total) by mouth daily. Take 2 tablets in the morning and 1 tablet in the evening. 11/17/23   Bernie Lamar PARAS, MD  montelukast  (SINGULAIR ) 10 MG tablet Take 10 mg by mouth at bedtime. 08/09/21   [provider]  NYSTATIN powder Apply 1 application  topically daily as needed (under breast/folds of skin). 07/29/21   [provider]  OZEMPIC, 0.25 OR 0.5 MG/DOSE, 2 MG/3ML SOPN Inject 0.5 mg into the skin once a week.    [provider]  pantoprazole  (PROTONIX ) 40 MG tablet Take 40 mg by mouth daily.    [provider]  polyethylene glycol (MIRALAX  / GLYCOLAX ) 17 g packet Take 17 g by mouth daily as needed for mild constipation.    [provider]   predniSONE  (DELTASONE ) 10 MG tablet Take 1 tablet (10 mg total) by mouth daily. 06/03/24   Hunsucker, Donnice SAUNDERS, MD  pregabalin  (LYRICA ) 150 MG capsule Take 150 mg by mouth 3 (three) times daily. 02/20/22   [provider]  rosuvastatin  (CRESTOR ) 5 MG tablet Take 5 mg by mouth daily.    [provider]  sacubitril -valsartan  (ENTRESTO ) 49-51 MG Take 1 tablet by mouth 2 (two) times daily. 10/19/23   Krasowski, Robert J, MD  spironolactone  (ALDACTONE ) 25 MG tablet Take 0.5 tablets (12.5 mg total) by mouth daily. 01/11/24   Krasowski, Robert J, MD  triamcinolone  (KENALOG ) 0.1 % paste Use as directed 1 Application in the mouth or throat 2 (two) times daily. 07/07/22   [provider]    Family History Family History  Problem Relation Age of Onset   Asthma Mother    COPD Mother    Diabetes Mother    Hypertension Mother    Kidney disease Mother    Heart disease Father    Colon polyps Father    Skin cancer Father    Depression Father    Diabetes Father    Colon polyps Sister    Crohn's disease Sister    Diverticulitis Sister    Hypercholesterolemia Sister    Kidney disease Brother    Heart disease Maternal Grandmother    Heart disease Paternal Grandmother    Colon cancer Neg Hx    Rectal cancer Neg Hx    Stomach cancer Neg Hx     Social History Social History   Tobacco Use   Smoking status: Never   Smokeless tobacco: Never  Vaping Use   Vaping status: Never Used  Substance Use Topics   Alcohol use: No    Alcohol/week: 0.0 standard drinks of alcohol   Drug use: No     Allergies   Ibuprofen and Lisinopril   Review of Systems Review of Systems  Constitutional:  Negative for chills and fever.  HENT:  Negative for ear pain and sore throat.   Eyes:  Negative for pain and visual disturbance.  Respiratory:  Positive for cough, chest tightness, shortness of breath and wheezing.   Cardiovascular:  Negative for chest pain and palpitations.   Gastrointestinal:  Negative for abdominal pain, constipation, diarrhea, nausea and vomiting.  Genitourinary:  Negative for dysuria and hematuria.  Musculoskeletal:  Negative for arthralgias and back pain.  Skin:  Negative for color change and rash.  Neurological:  Negative for seizures and syncope.  All other systems reviewed and are negative.    Physical Exam Triage Vital Signs ED Triage Vitals  Encounter Vitals Group     BP 06/19/24 1334 108/70     Girls Systolic BP Percentile --      Girls Diastolic BP Percentile --      Boys Systolic BP Percentile --      Boys Diastolic BP Percentile --      Pulse Rate 06/19/24 1334 73     Resp 06/19/24 1334 20     Temp 06/19/24 1334 98.5 F (36.9 C)     Temp Source 06/19/24 1334 Oral     SpO2 06/19/24 1334 96 %     Weight --      Height --      Head Circumference --      Peak Flow --      Pain Score 06/19/24 1333 4     Pain Loc --      Pain Education --      Exclude from Growth Chart --    No data found.  Updated Vital Signs BP 108/70 (BP Location: Right Arm)   Pulse 73   Temp 98.5 F (36.9 C) (Oral)   Resp 20   LMP  (LMP Unknown)   SpO2 96%   Visual Acuity Right Eye Distance:   Left Eye Distance:   Bilateral Distance:    Right Eye Near:   Left Eye Near:    Bilateral Near:     Physical Exam Vitals and nursing note reviewed.  Constitutional:      General: She is  not in acute distress.    Appearance: She is well-developed. She is not ill-appearing, toxic-appearing or diaphoretic.  HENT:     Head: Normocephalic and atraumatic.     Right Ear: Hearing, tympanic membrane, ear canal and external ear normal.     Left Ear: Hearing, tympanic membrane, ear canal and external ear normal.     Nose: Congestion and rhinorrhea present. Rhinorrhea is clear.     Right Sinus: No maxillary sinus tenderness or frontal sinus tenderness.     Left Sinus: No maxillary sinus tenderness or frontal sinus tenderness.     Mouth/Throat:      Lips: Pink.     Mouth: Mucous membranes are moist.     Pharynx: Uvula midline. No oropharyngeal exudate or posterior oropharyngeal erythema.     Tonsils: No tonsillar exudate.  Eyes:     Conjunctiva/sclera: Conjunctivae normal.     Pupils: Pupils are equal, round, and reactive to light.  Cardiovascular:     Rate and Rhythm: Normal rate and regular rhythm.     Heart sounds: S1 normal and S2 normal. No murmur heard. Pulmonary:     Effort: Pulmonary effort is normal. No respiratory distress.     Breath sounds: Examination of the right-upper field reveals wheezing. Examination of the left-upper field reveals wheezing. Examination of the right-middle field reveals wheezing. Examination of the left-middle field reveals wheezing. Examination of the right-lower field reveals wheezing. Examination of the left-lower field reveals wheezing. Wheezing (Inspiratory wheezing noted.) present. No decreased breath sounds, rhonchi or rales.  Abdominal:     General: Bowel sounds are normal.     Palpations: Abdomen is soft.     Tenderness: There is no abdominal tenderness.  Musculoskeletal:        General: No swelling.     Cervical back: Neck supple.  Lymphadenopathy:     Head:     Right side of head: No submental, submandibular, tonsillar, preauricular or posterior auricular adenopathy.     Left side of head: No submental, submandibular, tonsillar, preauricular or posterior auricular adenopathy.     Cervical: No cervical adenopathy.     Right cervical: No superficial cervical adenopathy.    Left cervical: No superficial cervical adenopathy.  Skin:    General: Skin is warm and dry.     Capillary Refill: Capillary refill takes less than 2 seconds.     Findings: No rash.  Neurological:     Mental Status: She is alert and oriented to person, place, and time.  Psychiatric:        Mood and Affect: Mood normal.      UC Treatments / Results  Labs (all labs ordered are listed, but only abnormal results  are displayed) Labs Reviewed  POC SOFIA SARS ANTIGEN FIA - Normal    EKG   Radiology DG Chest 2 View Result Date: 06/19/2024 EXAM: 2 VIEW(S) XRAY OF THE CHEST 06/19/2024 02:15:00 PM COMPARISON: None available. CLINICAL HISTORY: Increased difficulty coughing, shortness of breath, right chest pain, and upper back pain for 3 days. History of asthma and tracheobronchomalacia. FINDINGS: LUNGS AND PLEURA: No focal pulmonary opacity. No pulmonary edema. No pleural effusion. No pneumothorax. HEART AND MEDIASTINUM: Mild cardiomegaly, without edema. No acute abnormality of the mediastinal silhouette. BONES AND SOFT TISSUES: Lower cervical spine ACDF hardware noted. Mild thoracic spine degenerative changes. No acute osseous abnormality. IMPRESSION: 1. Mild cardiomegaly, without edema . 2. Mild thoracic spine degenerative changes. 3. Status post lower cervical spine ACDF. Electronically signed by: Ryan  Liebkemann MD 06/19/2024 02:45 PM EDT RP Workstation: [id number]    Procedures Procedures (including critical care time)  Medications Ordered in UC Medications - No data to display  Initial Impression / Assessment and Plan / UC Course  I have reviewed the triage vital signs and the nursing notes.  Pertinent labs & imaging results that were available during my care of the patient were reviewed by me and considered in my medical decision making (see chart for details).  Plan of Care: Acute asthma exacerbation with severe tracheomalacia and cough: Rapid COVID is negative.  Chest x-ray appears normal or negative for pneumonia.  Exam shows moderate inspiratory wheezing.  Continue Dupixent  and Trelegy.  Hold prednisone  10 mg daily for now.  Take prednisone20 mg, #2 pills (40 mg dose) daily x 3 days, then take 20 mg, #1 pill (20 mg dose) daily x 3 days.  Once completed resume prednisone  10 mg daily.  Use albuterol  inhaler or nebulizer as needed.  Follow-up with pulmonology as needed.   I reviewed the plan  of care with the patient and/or the patient's guardian.  The patient and/or guardian had time to ask questions and acknowledged that the questions were answered.  I provided instruction on symptoms or reasons to return here or to go to an ER, if symptoms/condition did not improve, worsened or if new symptoms occurred.  Final Clinical Impressions(s) / UC Diagnoses   Final diagnoses:  Acute cough  Tracheobronchomalacia  Severe persistent asthma with acute exacerbation [facility])     Discharge Instructions      Acute asthma exacerbation with severe tracheomalacia and cough: Rapid COVID is negative.  Chest x-ray appears normal or negative for pneumonia.  Exam shows moderate inspiratory wheezing.  Continue Dupixent  and Trelegy.  Hold prednisone  10 mg daily for now.  Take prednisone20 mg, #2 pills (40 mg dose) daily x 3 days, then take 20 mg, #1 pill (20 mg dose) daily x 3 days.  Once completed resume prednisone  10 mg daily.  Use albuterol  inhaler or nebulizer as needed.  Follow-up with pulmonology as needed.     ED Prescriptions     Medication Sig Dispense Auth. Provider   predniSONE  (DELTASONE ) 20 MG tablet Take 20 mg, #2 pills (40 mg dose) daily x 3 days, then take 20 mg, #1 pill (20 mg dose) daily x 3 days. 9 tablet Lovett Coffin, FNP      PDMP not reviewed this encounter.   Ival Domino, FNP 06/19/24 [phone]

## 2024-06-19 NOTE — ED Triage Notes (Signed)
 Pt states she has a hx of asthma and had a ct scan done recently. She was told she has tracheobronchomalacia- when she exhaled her trachea would close up 80% making it difficult to cough and makes her SOBr with ADL. In the last 3 days, she has started to having increased difficulty coughing, SOBr, right chest pain, and upper back pain. She states that her nebulizer and inhalers help some.

## 2024-06-20 ENCOUNTER — Telehealth: Payer: Self-pay

## 2024-06-20 ENCOUNTER — Other Ambulatory Visit: Payer: Self-pay

## 2024-06-20 ENCOUNTER — Encounter (INDEPENDENT_AMBULATORY_CARE_PROVIDER_SITE_OTHER): Payer: Self-pay

## 2024-06-20 ENCOUNTER — Other Ambulatory Visit: Payer: Self-pay | Admitting: Pharmacy Technician

## 2024-06-20 NOTE — Telephone Encounter (Signed)
 Copied from CRM [id number]. Topic: Clinical - Medical Advice >> Jun 20, 2024  2:44 PM Whitney O wrote: Reason for CRM: patient wanted to leave a message for dr chest was tight and coughing was horrible and was wheezing and they said I nreeded to call doctor and talk to him . Patient had to go to urgent care yesterday . Patient say she wanted to let the doctor know . They put me on more prednisone  . Patient says give her call [phone]     Wanted to leave Dr. Annella a message that she went to [facility] in [place] chest was tight and coughing was horrible and was wheezing. States that Wheezing may be worse today. But just wanted to let him know they added another dose of Predinisone on top of what he prescribed. Also wanted to let him know she is keeping her appointment with the Doctors in [facility]

## 2024-06-20 NOTE — Progress Notes (Signed)
 Specialty Pharmacy Refill Coordination Note  Crystal Marquez is a 63 y.o. female contacted today regarding refills of specialty medication(s) Dupilumab  (Dupixent )   Patient requested (Patient-Rptd) Delivery   Delivery date: 06/25/24 Verified address: (Patient-Rptd) [address]  [place], [place] [address]   Medication will be filled on 06/24/24.

## 2024-06-21 ENCOUNTER — Other Ambulatory Visit: Payer: Self-pay

## 2024-06-24 ENCOUNTER — Telehealth: Payer: Self-pay

## 2024-06-24 NOTE — Telephone Encounter (Signed)
 Pt viewed Echo results on My Chart per Dr. Vanetta Shawl note. Routed to PCP.

## 2024-06-24 NOTE — Telephone Encounter (Signed)
 Left message on My Chart with Echo results per Dr. Karry note. Routed to PCP.

## 2024-07-15 ENCOUNTER — Other Ambulatory Visit: Payer: Self-pay

## 2024-07-17 ENCOUNTER — Other Ambulatory Visit (HOSPITAL_COMMUNITY): Payer: Self-pay

## 2024-07-17 ENCOUNTER — Other Ambulatory Visit: Payer: Self-pay

## 2024-07-17 NOTE — Progress Notes (Signed)
 Specialty Pharmacy Refill Coordination Note  Crystal Marquez is a 63 y.o. female contacted today regarding refills of specialty medication(s) Dupilumab  (Dupixent )   Patient requested Delivery   Delivery date: 07/23/24   Verified address: [address] [place], [place] [address]   Medication will be filled on: 07/22/24

## 2024-07-22 ENCOUNTER — Other Ambulatory Visit: Payer: Self-pay

## 2024-07-22 ENCOUNTER — Encounter: Payer: Self-pay | Admitting: Radiology

## 2024-08-09 ENCOUNTER — Other Ambulatory Visit (HOSPITAL_COMMUNITY)
Admission: RE | Admit: 2024-08-09 | Discharge: 2024-08-09 | Disposition: A | Discharge status: Home / Self Care | Source: Ambulatory Visit

## 2024-08-09 ENCOUNTER — Ambulatory Visit (HOSPITAL_COMMUNITY)
Admission: RE | Admit: 2024-08-09 | Discharge: 2024-08-09 | Disposition: A | Discharge status: Home / Self Care | Source: Ambulatory Visit

## 2024-08-09 ENCOUNTER — Other Ambulatory Visit (HOSPITAL_COMMUNITY): Payer: Self-pay

## 2024-08-09 DIAGNOSIS — Z13220 Encounter for screening for lipoid disorders: Secondary | ICD-10-CM | POA: Insufficient documentation

## 2024-08-09 DIAGNOSIS — K219 Gastro-esophageal reflux disease without esophagitis: Secondary | ICD-10-CM | POA: Diagnosis not present

## 2024-08-09 DIAGNOSIS — E559 Vitamin D deficiency, unspecified: Secondary | ICD-10-CM | POA: Insufficient documentation

## 2024-08-09 DIAGNOSIS — R635 Abnormal weight gain: Secondary | ICD-10-CM

## 2024-08-09 DIAGNOSIS — Z1329 Encounter for screening for other suspected endocrine disorder: Secondary | ICD-10-CM | POA: Insufficient documentation

## 2024-08-09 DIAGNOSIS — E119 Type 2 diabetes mellitus without complications: Secondary | ICD-10-CM | POA: Diagnosis not present

## 2024-08-09 LAB — CBC WITH DIFFERENTIAL/PLATELET
Abs Immature Granulocytes: 0.3 K/uL — ABNORMAL HIGH (ref 0.00–0.07)
Basophils Absolute: 0 K/uL (ref 0.0–0.1)
Basophils Relative: 0 %
Eosinophils Absolute: 0 K/uL (ref 0.0–0.5)
Eosinophils Relative: 0 %
HCT: 40.1 % (ref 36.0–46.0)
Hemoglobin: 12.8 g/dL (ref 12.0–15.0)
Immature Granulocytes: 2 %
Lymphocytes Relative: 7 %
Lymphs Abs: 1.1 K/uL (ref 0.7–4.0)
MCH: 26.9 pg (ref 26.0–34.0)
MCHC: 31.9 g/dL (ref 30.0–36.0)
MCV: 84.2 fL (ref 80.0–100.0)
Monocytes Absolute: 0.6 K/uL (ref 0.1–1.0)
Monocytes Relative: 4 %
Neutro Abs: 14.5 K/uL — ABNORMAL HIGH (ref 1.7–7.7)
Neutrophils Relative %: 87 %
Platelets: 207 K/uL (ref 150–400)
RBC: 4.76 MIL/uL (ref 3.87–5.11)
RDW: 14.8 % (ref 11.5–15.5)
WBC: 16.6 K/uL — ABNORMAL HIGH (ref 4.0–10.5)
nRBC: 0 % (ref 0.0–0.2)

## 2024-08-09 LAB — LIPID PANEL
Cholesterol: 105 mg/dL (ref 0–200)
HDL: 67 mg/dL (ref >40)
LDL Cholesterol: 20 mg/dL (ref 0–99)
Total CHOL/HDL Ratio: 1.6 ratio
Triglycerides: 92 mg/dL (ref <150)
VLDL: 18 mg/dL (ref 0–40)

## 2024-08-09 LAB — HEMOGLOBIN A1C
Hgb A1c MFr Bld: 6.6 % — ABNORMAL HIGH (ref 4.8–5.6)
Mean Plasma Glucose: 142.72 mg/dL

## 2024-08-09 LAB — COMPREHENSIVE METABOLIC PANEL WITH GFR
ALT: 23 U/L (ref 0–44)
AST: 25 U/L (ref 15–41)
Albumin: 3.9 g/dL (ref 3.5–5.0)
Alkaline Phosphatase: 30 U/L — ABNORMAL LOW (ref 38–126)
Anion gap: 14 (ref 5–15)
BUN: 24 mg/dL — ABNORMAL HIGH (ref 8–23)
CO2: 25 mmol/L (ref 22–32)
Calcium: 9.1 mg/dL (ref 8.9–10.3)
Chloride: 99 mmol/L (ref 98–111)
Creatinine, Ser: 1.33 mg/dL — ABNORMAL HIGH (ref 0.44–1.00)
GFR, Estimated: 46 mL/min — ABNORMAL LOW (ref >60)
Glucose, Bld: 186 mg/dL — ABNORMAL HIGH (ref 70–99)
Potassium: 3.5 mmol/L (ref 3.5–5.1)
Sodium: 138 mmol/L (ref 135–145)
Total Bilirubin: 1 mg/dL (ref 0.0–1.2)
Total Protein: 6.4 g/dL — ABNORMAL LOW (ref 6.5–8.1)

## 2024-08-09 LAB — MAGNESIUM: Magnesium: 1.1 mg/dL — ABNORMAL LOW (ref 1.7–2.4)

## 2024-08-09 LAB — TSH: TSH: 0.171 u[IU]/mL — ABNORMAL LOW (ref 0.350–4.500)

## 2024-08-09 LAB — BRAIN NATRIURETIC PEPTIDE: B Natriuretic Peptide: 52.1 pg/mL (ref 0.0–100.0)

## 2024-08-09 LAB — VITAMIN D 25 HYDROXY (VIT D DEFICIENCY, FRACTURES): Vit D, 25-Hydroxy: 68.03 ng/mL (ref 30–100)

## 2024-08-12 ENCOUNTER — Other Ambulatory Visit (HOSPITAL_BASED_OUTPATIENT_CLINIC_OR_DEPARTMENT_OTHER): Payer: Self-pay

## 2024-08-12 ENCOUNTER — Other Ambulatory Visit: Payer: Self-pay

## 2024-08-12 DIAGNOSIS — N959 Unspecified menopausal and perimenopausal disorder: Secondary | ICD-10-CM

## 2024-08-14 ENCOUNTER — Other Ambulatory Visit (HOSPITAL_COMMUNITY): Payer: Self-pay

## 2024-08-16 ENCOUNTER — Other Ambulatory Visit: Payer: Self-pay

## 2024-08-20 ENCOUNTER — Other Ambulatory Visit: Payer: Self-pay

## 2024-08-20 NOTE — Progress Notes (Signed)
 Specialty Pharmacy Refill Coordination Note  Crystal Marquez is a 63 y.o. female contacted today regarding refills of specialty medication(s) Dupilumab  (Dupixent )   Patient requested Delivery   Delivery date: 08/22/24   Verified address: [address] [place], [place] [address]   Medication will be filled on: 08/21/24

## 2024-08-22 ENCOUNTER — Inpatient Hospital Stay (INDEPENDENT_AMBULATORY_CARE_PROVIDER_SITE_OTHER): Admission: RE | Admit: 2024-08-22 | Discharge: 2024-08-22

## 2024-08-22 DIAGNOSIS — N959 Unspecified menopausal and perimenopausal disorder: Secondary | ICD-10-CM

## 2024-09-08 ENCOUNTER — Other Ambulatory Visit: Payer: Self-pay | Admitting: Cardiology

## 2024-09-11 ENCOUNTER — Other Ambulatory Visit: Payer: Self-pay

## 2024-09-13 ENCOUNTER — Other Ambulatory Visit: Payer: Self-pay

## 2024-09-13 ENCOUNTER — Other Ambulatory Visit (HOSPITAL_COMMUNITY): Payer: Self-pay

## 2024-09-13 NOTE — Progress Notes (Signed)
 Specialty Pharmacy Refill Coordination Note  Crystal Marquez is a 63 y.o. female contacted today regarding refills of specialty medication(s) Dupilumab  (Dupixent )   Patient requested Delivery   Delivery date: 09/17/24   Verified address: [address] [place], [place] [address]   Medication will be filled on: 09/16/24

## 2024-09-14 ENCOUNTER — Other Ambulatory Visit: Payer: Self-pay | Admitting: Pulmonary Disease

## 2024-09-16 ENCOUNTER — Other Ambulatory Visit: Payer: Self-pay

## 2024-10-04 ENCOUNTER — Other Ambulatory Visit: Payer: Self-pay

## 2024-10-06 ENCOUNTER — Other Ambulatory Visit: Payer: Self-pay | Admitting: Cardiology

## 2024-10-07 ENCOUNTER — Other Ambulatory Visit (HOSPITAL_COMMUNITY): Payer: Self-pay

## 2024-10-07 ENCOUNTER — Encounter: Payer: Self-pay | Admitting: Cardiology

## 2024-10-07 ENCOUNTER — Other Ambulatory Visit: Payer: Self-pay

## 2024-10-07 ENCOUNTER — Other Ambulatory Visit: Payer: Self-pay | Admitting: Pharmacy Technician

## 2024-10-08 ENCOUNTER — Other Ambulatory Visit: Payer: Self-pay

## 2024-10-08 MED ORDER — SPIRONOLACTONE 25 MG PO TABS: 12.5000 mg | ORAL_TABLET | Freq: Every day | ORAL | 2 refills | Status: AC

## 2024-10-09 ENCOUNTER — Other Ambulatory Visit: Payer: Self-pay

## 2024-10-09 ENCOUNTER — Encounter: Payer: Self-pay | Admitting: Pulmonary Disease

## 2024-10-10 ENCOUNTER — Other Ambulatory Visit: Payer: Self-pay

## 2024-10-10 ENCOUNTER — Other Ambulatory Visit (HOSPITAL_COMMUNITY): Payer: Self-pay

## 2024-10-11 ENCOUNTER — Other Ambulatory Visit: Payer: Self-pay

## 2024-10-13 ENCOUNTER — Other Ambulatory Visit: Payer: Self-pay | Admitting: Pulmonary Disease

## 2024-10-13 DIAGNOSIS — J4550 Severe persistent asthma, uncomplicated: Secondary | ICD-10-CM

## 2024-10-14 ENCOUNTER — Ambulatory Visit: Admitting: Pulmonary Disease

## 2024-10-14 ENCOUNTER — Other Ambulatory Visit: Payer: Self-pay

## 2024-10-14 ENCOUNTER — Encounter: Admitting: Pulmonary Disease

## 2024-10-16 ENCOUNTER — Other Ambulatory Visit (HOSPITAL_COMMUNITY): Payer: Self-pay

## 2024-10-16 ENCOUNTER — Other Ambulatory Visit: Payer: Self-pay

## 2024-10-16 NOTE — Addendum Note (Signed)
 Addended by: SHAREN DELON HERO on: 10/16/2024 10:01 AM   Modules accepted: Orders

## 2024-10-17 ENCOUNTER — Other Ambulatory Visit: Payer: Self-pay

## 2024-10-18 ENCOUNTER — Ambulatory Visit: Admitting: Cardiology

## 2024-10-18 ENCOUNTER — Other Ambulatory Visit (HOSPITAL_COMMUNITY): Payer: Self-pay

## 2024-10-21 ENCOUNTER — Other Ambulatory Visit (HOSPITAL_COMMUNITY): Payer: Self-pay

## 2024-10-21 ENCOUNTER — Other Ambulatory Visit: Payer: Self-pay

## 2024-10-21 ENCOUNTER — Ambulatory Visit

## 2024-10-21 DIAGNOSIS — J4550 Severe persistent asthma, uncomplicated: Secondary | ICD-10-CM

## 2024-10-21 DIAGNOSIS — Z79899 Other long term (current) drug therapy: Secondary | ICD-10-CM

## 2024-10-21 MED ORDER — DUPIXENT 300 MG/2ML ~~LOC~~ SOAJ
300.0000 mg | SUBCUTANEOUS | 2 refills | Status: AC
  Filled 2024-10-21: qty 4, 28d supply, fill #0

## 2024-10-21 NOTE — Progress Notes (Signed)
 Specialty Pharmacy Refill Coordination Note  Crystal Marquez is a 64 y.o. female contacted today regarding refills of specialty medication(s) Dupilumab  (Dupixent )   Patient requested Delivery   Delivery date: 10/23/24   Verified address: [address] [place], [place] [address]   Medication will be filled on: 10/22/24

## 2024-10-22 ENCOUNTER — Other Ambulatory Visit: Payer: Self-pay

## 2024-10-22 ENCOUNTER — Ambulatory Visit: Admitting: Pulmonary Disease

## 2024-11-25 ENCOUNTER — Ambulatory Visit: Admitting: Pulmonary Disease

## 2024-12-09 ENCOUNTER — Ambulatory Visit: Admitting: Cardiology
